# Patient Record
Sex: Male | Born: 1940 | Race: White | Hispanic: No | Marital: Married | Smoking: Former smoker
Health system: Southern US, Community
[De-identification: ages and names within clinical notes are randomized; demographics above are authoritative.]

## PROBLEM LIST (undated history)

## (undated) DIAGNOSIS — K219 Gastro-esophageal reflux disease without esophagitis: Secondary | ICD-10-CM

## (undated) DIAGNOSIS — I209 Angina pectoris, unspecified: Secondary | ICD-10-CM

## (undated) DIAGNOSIS — I10 Essential (primary) hypertension: Secondary | ICD-10-CM

## (undated) DIAGNOSIS — I714 Abdominal aortic aneurysm, without rupture, unspecified: Secondary | ICD-10-CM

## (undated) DIAGNOSIS — Z974 Presence of external hearing-aid: Secondary | ICD-10-CM

## (undated) DIAGNOSIS — Z972 Presence of dental prosthetic device (complete) (partial): Secondary | ICD-10-CM

## (undated) DIAGNOSIS — N289 Disorder of kidney and ureter, unspecified: Secondary | ICD-10-CM

## (undated) DIAGNOSIS — I2510 Atherosclerotic heart disease of native coronary artery without angina pectoris: Secondary | ICD-10-CM

## (undated) DIAGNOSIS — G4730 Sleep apnea, unspecified: Secondary | ICD-10-CM

## (undated) DIAGNOSIS — E7800 Pure hypercholesterolemia, unspecified: Secondary | ICD-10-CM

## (undated) DIAGNOSIS — C801 Malignant (primary) neoplasm, unspecified: Secondary | ICD-10-CM

## (undated) DIAGNOSIS — E119 Type 2 diabetes mellitus without complications: Secondary | ICD-10-CM

## (undated) DIAGNOSIS — I429 Cardiomyopathy, unspecified: Secondary | ICD-10-CM

## (undated) DIAGNOSIS — G5600 Carpal tunnel syndrome, unspecified upper limb: Secondary | ICD-10-CM

## (undated) DIAGNOSIS — I499 Cardiac arrhythmia, unspecified: Secondary | ICD-10-CM

## (undated) DIAGNOSIS — I8390 Asymptomatic varicose veins of unspecified lower extremity: Secondary | ICD-10-CM

## (undated) DIAGNOSIS — B351 Tinea unguium: Secondary | ICD-10-CM

## (undated) DIAGNOSIS — M1990 Unspecified osteoarthritis, unspecified site: Secondary | ICD-10-CM

## (undated) DIAGNOSIS — E1121 Type 2 diabetes mellitus with diabetic nephropathy: Secondary | ICD-10-CM

## (undated) HISTORY — PX: TONSILLECTOMY: SUR1361

## (undated) HISTORY — PX: CORONARY ANGIOPLASTY: SHX604

---

## 1997-12-27 ENCOUNTER — Inpatient Hospital Stay (HOSPITAL_COMMUNITY): Admission: EM | Admit: 1997-12-27 | Discharge: 1997-12-30 | Payer: Self-pay | Admitting: Emergency Medicine

## 1998-09-19 ENCOUNTER — Ambulatory Visit (HOSPITAL_COMMUNITY): Admission: RE | Admit: 1998-09-19 | Discharge: 1998-09-19 | Payer: Self-pay | Admitting: *Deleted

## 2004-01-30 ENCOUNTER — Other Ambulatory Visit: Payer: Self-pay

## 2004-03-05 ENCOUNTER — Encounter: Payer: Self-pay | Admitting: Internal Medicine

## 2004-04-02 ENCOUNTER — Encounter: Payer: Self-pay | Admitting: Internal Medicine

## 2004-04-17 ENCOUNTER — Ambulatory Visit: Payer: Self-pay | Admitting: Internal Medicine

## 2007-01-01 ENCOUNTER — Ambulatory Visit: Payer: Self-pay | Admitting: Internal Medicine

## 2007-07-05 ENCOUNTER — Ambulatory Visit: Payer: Self-pay | Admitting: Internal Medicine

## 2008-02-23 ENCOUNTER — Ambulatory Visit: Payer: Self-pay | Admitting: Internal Medicine

## 2009-08-28 ENCOUNTER — Ambulatory Visit: Payer: Self-pay | Admitting: Internal Medicine

## 2010-07-25 ENCOUNTER — Other Ambulatory Visit: Payer: Self-pay | Admitting: Internal Medicine

## 2010-07-26 ENCOUNTER — Ambulatory Visit: Payer: Self-pay | Admitting: Internal Medicine

## 2010-08-26 ENCOUNTER — Ambulatory Visit: Payer: Self-pay | Admitting: Internal Medicine

## 2010-09-19 ENCOUNTER — Ambulatory Visit: Payer: Self-pay | Admitting: Internal Medicine

## 2012-03-02 ENCOUNTER — Encounter: Payer: Self-pay | Admitting: Nurse Practitioner

## 2012-03-02 ENCOUNTER — Encounter: Payer: Self-pay | Admitting: Cardiothoracic Surgery

## 2012-06-08 ENCOUNTER — Ambulatory Visit: Payer: Self-pay | Admitting: Internal Medicine

## 2012-09-15 ENCOUNTER — Ambulatory Visit: Payer: Self-pay | Admitting: Internal Medicine

## 2013-06-16 ENCOUNTER — Ambulatory Visit: Payer: Self-pay | Admitting: Internal Medicine

## 2014-09-18 DIAGNOSIS — E119 Type 2 diabetes mellitus without complications: Secondary | ICD-10-CM | POA: Insufficient documentation

## 2015-06-08 ENCOUNTER — Encounter: Payer: Self-pay | Admitting: *Deleted

## 2015-06-11 ENCOUNTER — Encounter: Admission: RE | Payer: Self-pay | Source: Ambulatory Visit

## 2015-06-11 ENCOUNTER — Ambulatory Visit
Admission: RE | Admit: 2015-06-11 | Payer: Medicare Other | Source: Ambulatory Visit | Admitting: Unknown Physician Specialty

## 2015-06-11 HISTORY — DX: Cardiac arrhythmia, unspecified: I49.9

## 2015-06-11 HISTORY — DX: Malignant (primary) neoplasm, unspecified: C80.1

## 2015-06-11 HISTORY — DX: Abdominal aortic aneurysm, without rupture: I71.4

## 2015-06-11 HISTORY — DX: Atherosclerotic heart disease of native coronary artery without angina pectoris: I25.10

## 2015-06-11 HISTORY — DX: Sleep apnea, unspecified: G47.30

## 2015-06-11 HISTORY — DX: Angina pectoris, unspecified: I20.9

## 2015-06-11 HISTORY — DX: Type 2 diabetes mellitus with diabetic nephropathy: E11.21

## 2015-06-11 HISTORY — DX: Abdominal aortic aneurysm, without rupture, unspecified: I71.40

## 2015-06-11 HISTORY — DX: Essential (primary) hypertension: I10

## 2015-06-11 HISTORY — DX: Pure hypercholesterolemia, unspecified: E78.00

## 2015-06-11 HISTORY — DX: Disorder of kidney and ureter, unspecified: N28.9

## 2015-06-11 HISTORY — DX: Gastro-esophageal reflux disease without esophagitis: K21.9

## 2015-06-11 HISTORY — DX: Asymptomatic varicose veins of unspecified lower extremity: I83.90

## 2015-06-11 HISTORY — DX: Cardiomyopathy, unspecified: I42.9

## 2015-06-11 HISTORY — DX: Tinea unguium: B35.1

## 2015-06-11 HISTORY — DX: Type 2 diabetes mellitus without complications: E11.9

## 2015-06-11 SURGERY — COLONOSCOPY WITH PROPOFOL
Anesthesia: General

## 2015-09-27 ENCOUNTER — Encounter: Payer: Self-pay | Admitting: *Deleted

## 2015-09-28 ENCOUNTER — Encounter
Admission: RE | Disposition: A | Discharge status: Home / Self Care | Payer: Self-pay | Source: Ambulatory Visit | Attending: Unknown Physician Specialty

## 2015-09-28 ENCOUNTER — Ambulatory Visit: Payer: Medicare Other | Admitting: Anesthesiology

## 2015-09-28 ENCOUNTER — Ambulatory Visit
Admission: RE | Admit: 2015-09-28 | Discharge: 2015-09-28 | Disposition: A | Discharge status: Home / Self Care | Payer: Medicare Other | Source: Ambulatory Visit | Attending: Unknown Physician Specialty | Admitting: Unknown Physician Specialty

## 2015-09-28 ENCOUNTER — Encounter: Payer: Self-pay | Admitting: *Deleted

## 2015-09-28 DIAGNOSIS — Z85118 Personal history of other malignant neoplasm of bronchus and lung: Secondary | ICD-10-CM | POA: Insufficient documentation

## 2015-09-28 DIAGNOSIS — Z8249 Family history of ischemic heart disease and other diseases of the circulatory system: Secondary | ICD-10-CM | POA: Insufficient documentation

## 2015-09-28 DIAGNOSIS — K5730 Diverticulosis of large intestine without perforation or abscess without bleeding: Secondary | ICD-10-CM | POA: Diagnosis not present

## 2015-09-28 DIAGNOSIS — E7800 Pure hypercholesterolemia, unspecified: Secondary | ICD-10-CM | POA: Insufficient documentation

## 2015-09-28 DIAGNOSIS — I480 Paroxysmal atrial fibrillation: Secondary | ICD-10-CM | POA: Insufficient documentation

## 2015-09-28 DIAGNOSIS — G4730 Sleep apnea, unspecified: Secondary | ICD-10-CM | POA: Diagnosis not present

## 2015-09-28 DIAGNOSIS — Z833 Family history of diabetes mellitus: Secondary | ICD-10-CM | POA: Insufficient documentation

## 2015-09-28 DIAGNOSIS — K621 Rectal polyp: Secondary | ICD-10-CM | POA: Diagnosis not present

## 2015-09-28 DIAGNOSIS — Z7982 Long term (current) use of aspirin: Secondary | ICD-10-CM | POA: Diagnosis not present

## 2015-09-28 DIAGNOSIS — I714 Abdominal aortic aneurysm, without rupture: Secondary | ICD-10-CM | POA: Insufficient documentation

## 2015-09-28 DIAGNOSIS — Z794 Long term (current) use of insulin: Secondary | ICD-10-CM | POA: Insufficient documentation

## 2015-09-28 DIAGNOSIS — Z87891 Personal history of nicotine dependence: Secondary | ICD-10-CM | POA: Insufficient documentation

## 2015-09-28 DIAGNOSIS — I429 Cardiomyopathy, unspecified: Secondary | ICD-10-CM | POA: Insufficient documentation

## 2015-09-28 DIAGNOSIS — Z955 Presence of coronary angioplasty implant and graft: Secondary | ICD-10-CM | POA: Diagnosis not present

## 2015-09-28 DIAGNOSIS — I10 Essential (primary) hypertension: Secondary | ICD-10-CM | POA: Diagnosis not present

## 2015-09-28 DIAGNOSIS — D122 Benign neoplasm of ascending colon: Secondary | ICD-10-CM | POA: Diagnosis not present

## 2015-09-28 DIAGNOSIS — D125 Benign neoplasm of sigmoid colon: Secondary | ICD-10-CM | POA: Insufficient documentation

## 2015-09-28 DIAGNOSIS — E1121 Type 2 diabetes mellitus with diabetic nephropathy: Secondary | ICD-10-CM | POA: Insufficient documentation

## 2015-09-28 DIAGNOSIS — Z882 Allergy status to sulfonamides status: Secondary | ICD-10-CM | POA: Diagnosis not present

## 2015-09-28 DIAGNOSIS — Z6838 Body mass index (BMI) 38.0-38.9, adult: Secondary | ICD-10-CM | POA: Diagnosis not present

## 2015-09-28 DIAGNOSIS — K219 Gastro-esophageal reflux disease without esophagitis: Secondary | ICD-10-CM | POA: Insufficient documentation

## 2015-09-28 DIAGNOSIS — Z1211 Encounter for screening for malignant neoplasm of colon: Secondary | ICD-10-CM | POA: Diagnosis not present

## 2015-09-28 DIAGNOSIS — Z801 Family history of malignant neoplasm of trachea, bronchus and lung: Secondary | ICD-10-CM | POA: Diagnosis not present

## 2015-09-28 DIAGNOSIS — Z79899 Other long term (current) drug therapy: Secondary | ICD-10-CM | POA: Insufficient documentation

## 2015-09-28 DIAGNOSIS — I2510 Atherosclerotic heart disease of native coronary artery without angina pectoris: Secondary | ICD-10-CM | POA: Insufficient documentation

## 2015-09-28 DIAGNOSIS — K640 First degree hemorrhoids: Secondary | ICD-10-CM | POA: Diagnosis not present

## 2015-09-28 DIAGNOSIS — Z7901 Long term (current) use of anticoagulants: Secondary | ICD-10-CM | POA: Diagnosis not present

## 2015-09-28 HISTORY — PX: COLONOSCOPY WITH PROPOFOL: SHX5780

## 2015-09-28 LAB — GLUCOSE, CAPILLARY: GLUCOSE-CAPILLARY: 140 mg/dL — AB (ref 65–99)

## 2015-09-28 SURGERY — COLONOSCOPY WITH PROPOFOL
Anesthesia: General

## 2015-09-28 MED ORDER — FENTANYL CITRATE (PF) 100 MCG/2ML IJ SOLN
INTRAMUSCULAR | Status: DC | PRN
  Administered 2015-09-28: 50 ug via INTRAVENOUS

## 2015-09-28 MED ORDER — SODIUM CHLORIDE 0.9 % IV SOLN
INTRAVENOUS | Status: DC
  Administered 2015-09-28: 09:00:00 via INTRAVENOUS

## 2015-09-28 MED ORDER — SODIUM CHLORIDE 0.9 % IV SOLN: INTRAVENOUS | Status: DC

## 2015-09-28 MED ORDER — LIDOCAINE HCL (PF) 2 % IJ SOLN
INTRAMUSCULAR | Status: DC | PRN
  Administered 2015-09-28: 50 mg

## 2015-09-28 MED ORDER — MIDAZOLAM HCL 5 MG/5ML IJ SOLN
INTRAMUSCULAR | Status: DC | PRN
  Administered 2015-09-28: 1 mg via INTRAVENOUS

## 2015-09-28 MED ORDER — PROPOFOL 500 MG/50ML IV EMUL
INTRAVENOUS | Status: DC | PRN
  Administered 2015-09-28: 30 ug/kg/min via INTRAVENOUS

## 2015-09-28 MED ORDER — PROPOFOL 10 MG/ML IV BOLUS
INTRAVENOUS | Status: DC | PRN
  Administered 2015-09-28: 30 mg via INTRAVENOUS
  Administered 2015-09-28: 10 mg via INTRAVENOUS

## 2015-09-28 NOTE — Anesthesia Postprocedure Evaluation (Signed)
Anesthesia Post Note  Patient: Glen Jenkins  Procedure(s) Performed: Procedure(s) (LRB): COLONOSCOPY WITH PROPOFOL (N/A)  Patient location during evaluation: Endoscopy Anesthesia Type: General Level of consciousness: awake and alert Pain management: pain level controlled Vital Signs Assessment: post-procedure vital signs reviewed and stable Respiratory status: spontaneous breathing, nonlabored ventilation, respiratory function stable and patient connected to nasal cannula oxygen Cardiovascular status: blood pressure returned to baseline and stable Postop Assessment: no signs of nausea or vomiting Anesthetic complications: no    Last Vitals:  Filed Vitals:   09/28/15 0941 09/28/15 0950  BP: 126/66 127/55  Pulse: 47   Temp:    Resp: 12     Last Pain: There were no vitals filed for this visit.               Martha Clan

## 2015-09-28 NOTE — Op Note (Signed)
[facility] Gastroenterology Patient Name: Glen Jenkins Procedure Date: 09/28/2015 9:00 AM MRN: [id number] Account #: [id number] Date of Birth: Jun 02, 1941 Admit Type: Outpatient Age: 75 Room: [facility] ENDO ROOM 1 Gender: Male Note Status: Finalized Procedure:            Colonoscopy Indications:          Screening for colorectal malignant neoplasm Providers:            Manya Silvas, MD Referring MD:         Leonie Douglas. Doy Hutching, MD (Referring MD) Medicines:            Propofol per Anesthesia Complications:        No immediate complications. Procedure:            Pre-Anesthesia Assessment:                       - After reviewing the risks and benefits, the patient                        was deemed in satisfactory condition to undergo the                        procedure.                       After obtaining informed consent, the colonoscope was                        passed under direct vision. Throughout the procedure,                        the patient's blood pressure, pulse, and oxygen                        saturations were monitored continuously. The                        Colonoscope was introduced through the anus and                        advanced to the the cecum, identified by appendiceal                        orifice and ileocecal valve. The colonoscopy was                        performed without difficulty. The patient tolerated the                        procedure well. The quality of the bowel preparation                        was good. Findings:      Three sessile polyps were found in the rectum, proximal sigmoid colon       and ascending colon. The polyps were diminutive in size. These polyps       were removed with a jumbo cold forceps. Resection and retrieval were       complete.      Multiple small-mouthed diverticula were found in the sigmoid colon.      Internal hemorrhoids were found during  endoscopy. The hemorrhoids were       small  and Grade I (internal hemorrhoids that do not prolapse).      The exam was otherwise without abnormality. Impression:           - Three diminutive polyps in the rectum, in the                        proximal sigmoid colon and in the ascending colon,                        removed with a jumbo cold forceps. Resected and                        retrieved.                       - Diverticulosis in the sigmoid colon.                       - Internal hemorrhoids.                       - The examination was otherwise normal. Recommendation:       - Await pathology results. Wait 3 days to restart blood                        thinner. Manya Silvas, MD 09/28/2015 9:38:53 AM This report has been signed electronically. Number of Addenda: 0 Note Initiated On: 09/28/2015 9:00 AM Scope Withdrawal Time: 0 hours 14 minutes 57 seconds  Total Procedure Duration: 0 hours 22 minutes 52 seconds       [facility]

## 2015-09-28 NOTE — H&P (Signed)
Primary Care Physician:  Idelle Crouch, MD Primary Gastroenterologist:  Dr. Vira Agar  Pre-Procedure History & Physical: HPI:  Glen Jenkins is a 75 y.o. male is here for an colonoscopy.   Past Medical History  Diagnosis Date  . AAA (abdominal aortic aneurysm) ([place])   . Anginal pain ([place])   . Dysrhythmia     Atrial Fibrillation  . Coronary artery disease   . Cardiomyopathy ([place])   . Diabetes mellitus without complication ([place])   . GERD (gastroesophageal reflux disease)   . Hypertension   . Hypercholesterolemia   . Nephropathy, diabetic ([place])   . Onychomycosis   . Renal insufficiency   . Sleep apnea   . Cancer ([place])     Lung Cancer; Squamous Cell Carcinoma  . Varicose veins     Past Surgical History  Procedure Laterality Date  . Tonsillectomy    . Coronary angioplasty      Stent placement x 2    Prior to Admission medications   Medication Sig Start Date End Date Taking? Authorizing Provider  aspirin EC 81 MG tablet Take 81 mg by mouth daily.   Yes Historical Provider, MD  b complex vitamins capsule Take 1 capsule by mouth daily.   Yes Historical Provider, MD  dabigatran (PRADAXA) 150 MG CAPS capsule Take 150 mg by mouth 2 (two) times daily.   Yes Historical Provider, MD  Multiple Vitamin (ANTIOXIDANT A/C/E PO) Take 1 tablet by mouth daily.   Yes Historical Provider, MD  amiodarone (PACERONE) 200 MG tablet Take 200 mg by mouth daily.    Historical Provider, MD  Biotin 1 MG CAPS Take 1 tablet by mouth daily.    Historical Provider, MD  docusate calcium (SURFAK) 240 MG capsule Take 240 mg by mouth daily.    Historical Provider, MD  furosemide (LASIX) 20 MG tablet Take 20 mg by mouth.    Historical Provider, MD  glipiZIDE (GLUCOTROL XL) 10 MG 24 hr tablet Take 10 mg by mouth daily with breakfast.    Historical Provider, MD  isosorbide mononitrate (IMDUR) 30 MG 24 hr tablet Take 30 mg by mouth daily.    Historical Provider, MD  lisinopril (PRINIVIL,ZESTRIL) 40 MG tablet  Take 40 mg by mouth daily.    Historical Provider, MD  metFORMIN (GLUCOPHAGE) 1000 MG tablet Take 1,000 mg by mouth 2 (two) times daily with a meal.    Historical Provider, MD  pantoprazole (PROTONIX) 40 MG tablet Take 40 mg by mouth daily.    Historical Provider, MD  simvastatin (ZOCOR) 40 MG tablet Take 40 mg by mouth daily at 6 PM.    Historical Provider, MD    Allergies as of 09/19/2015 - Review Complete 06/08/2015  Allergen Reaction Noted  . Sulfa antibiotics  06/08/2015    History reviewed. No pertinent family history.  Social History   Social History  . Marital Status: Married    Spouse Name: N/A  . Number of Children: N/A  . Years of Education: N/A   Occupational History  . Not on file.   Social History Main Topics  . Smoking status: Former Research scientist (life sciences)  . Smokeless tobacco: Never Used  . Alcohol Use: Yes  . Drug Use: No  . Sexual Activity: Not on file   Other Topics Concern  . Not on file   Social History Narrative    Review of Systems: See HPI, otherwise negative ROS  Physical Exam: BP 142/57 mmHg  Pulse 98  Temp(Src) 98.5 F (36.9 C) (Tympanic)  Resp 20  Ht 6\' 2"  (1.88 m)  Wt 136.079 kg (300 lb)  BMI 38.50 kg/m2  SpO2 99% General:   Alert,  pleasant and cooperative in NAD Head:  Normocephalic and atraumatic. Neck:  Supple; no masses or thyromegaly. Lungs:  Clear throughout to auscultation.    Heart:  Regular rate and rhythm. Abdomen:  Soft, nontender and nondistended. Normal bowel sounds, without guarding, and without rebound.   Neurologic:  Alert and  oriented x4;  grossly normal neurologically.  Impression/Plan: Glen Jenkins is here for an colonoscopy to be performed for screening  Risks, benefits, limitations, and alternatives regarding  colonoscopy have been reviewed with the patient.  Questions have been answered.  All parties agreeable.   Gaylyn Cheers, MD  09/28/2015, 8:55 AM

## 2015-09-28 NOTE — Transfer of Care (Signed)
Immediate Anesthesia Transfer of Care Note  Patient: Glen Jenkins  Procedure(s) Performed: Procedure(s): COLONOSCOPY WITH PROPOFOL (N/A)  Patient Location: PACU  Anesthesia Type:General  Level of Consciousness: sedated  Airway & Oxygen Therapy: Patient Spontanous Breathing and Patient connected to nasal cannula oxygen  Post-op Assessment: Report given to RN and Post -op Vital signs reviewed and stable  Post vital signs: Reviewed and stable  Last Vitals:  Filed Vitals:   09/28/15 0827  BP: 142/57  Pulse: 98  Temp: 36.9 C  Resp: 20    Last Pain: There were no vitals filed for this visit.       Complications: No apparent anesthesia complications

## 2015-09-28 NOTE — Anesthesia Preprocedure Evaluation (Signed)
Anesthesia Evaluation  Patient identified by MRN, date of birth, ID band Patient awake    Reviewed: Allergy & Precautions, H&P , NPO status , Patient's Chart, lab work & pertinent test results, reviewed documented beta blocker date and time   History of Anesthesia Complications Negative for: history of anesthetic complications  Airway Mallampati: III  TM Distance: >3 FB Neck ROM: full    Dental no notable dental hx. (+) Partial Upper, Missing, Teeth Intact   Pulmonary neg shortness of breath, sleep apnea , neg COPD, Recent URI , Resolved, former smoker,    Pulmonary exam normal breath sounds clear to auscultation       Cardiovascular Exercise Tolerance: Good hypertension, (-) angina+ CAD, + Past MI, + Cardiac Stents (3 stents, last placed 3-4 years ago), + Peripheral Vascular Disease and +CHF  (-) CABG Normal cardiovascular exam+ dysrhythmias Atrial Fibrillation + Valvular Problems/Murmurs  Rhythm:regular Rate:Normal     Neuro/Psych negative neurological ROS  negative psych ROS   GI/Hepatic Neg liver ROS, GERD  ,  Endo/Other  diabetes, Oral Hypoglycemic AgentsMorbid obesity  Renal/GU CRFRenal disease  negative genitourinary   Musculoskeletal   Abdominal   Peds  Hematology negative hematology ROS (+)   Anesthesia Other Findings Past Medical History:   AAA (abdominal aortic aneurysm) ([facility])                        Anginal pain ([facility])                                           Dysrhythmia                                                    Comment:Atrial Fibrillation   Coronary artery disease                                      Cardiomyopathy ([place])                                         Diabetes mellitus without complication ([place])                 GERD (gastroesophageal reflux disease)                       Hypertension                                                 Hypercholesterolemia                                          Nephropathy, 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[place])  Onychomycosis                                                Renal insufficiency                                          Sleep apnea                                                  Cancer ([place])                                                   Comment:Lung Cancer; Squamous Cell Carcinoma   Varicose veins                                               Reproductive/Obstetrics negative OB ROS                             Anesthesia Physical Anesthesia Plan  ASA: III  Anesthesia Plan: General   Post-op Pain Management:    Induction:   Airway Management Planned:   Additional Equipment:   Intra-op Plan:   Post-operative Plan:   Informed Consent: I have reviewed the patients History and Physical, chart, labs and discussed the procedure including the risks, benefits and alternatives for the proposed anesthesia with the patient or authorized representative who has indicated his/her understanding and acceptance.   Dental Advisory Given  Plan Discussed with: Anesthesiologist, CRNA and Surgeon  Anesthesia Plan Comments:         Anesthesia Quick Evaluation

## 2015-10-01 ENCOUNTER — Encounter: Payer: Self-pay | Admitting: Unknown Physician Specialty

## 2015-10-01 LAB — SURGICAL PATHOLOGY

## 2015-10-03 ENCOUNTER — Other Ambulatory Visit: Payer: Self-pay | Admitting: Internal Medicine

## 2015-10-03 DIAGNOSIS — I714 Abdominal aortic aneurysm, without rupture, unspecified: Secondary | ICD-10-CM

## 2015-10-15 ENCOUNTER — Other Ambulatory Visit: Payer: Self-pay | Admitting: Internal Medicine

## 2015-10-15 DIAGNOSIS — I714 Abdominal aortic aneurysm, without rupture, unspecified: Secondary | ICD-10-CM

## 2015-11-01 ENCOUNTER — Ambulatory Visit
Admission: RE | Admit: 2015-11-01 | Discharge: 2015-11-01 | Disposition: A | Discharge status: Home / Self Care | Payer: Medicare Other | Source: Ambulatory Visit | Attending: Internal Medicine | Admitting: Internal Medicine

## 2015-11-01 DIAGNOSIS — I77819 Aortic ectasia, unspecified site: Secondary | ICD-10-CM | POA: Diagnosis not present

## 2015-11-01 DIAGNOSIS — Q6102 Congenital multiple renal cysts: Secondary | ICD-10-CM | POA: Diagnosis not present

## 2015-11-01 DIAGNOSIS — I714 Abdominal aortic aneurysm, without rupture, unspecified: Secondary | ICD-10-CM

## 2016-01-04 DIAGNOSIS — N183 Chronic kidney disease, stage 3 unspecified: Secondary | ICD-10-CM | POA: Insufficient documentation

## 2017-02-09 DIAGNOSIS — K649 Unspecified hemorrhoids: Secondary | ICD-10-CM | POA: Insufficient documentation

## 2017-05-18 ENCOUNTER — Other Ambulatory Visit: Payer: Self-pay | Admitting: Physician Assistant

## 2017-05-18 ENCOUNTER — Ambulatory Visit
Admission: RE | Admit: 2017-05-18 | Discharge: 2017-05-18 | Disposition: A | Discharge status: Home / Self Care | Payer: Medicare Other | Source: Ambulatory Visit | Attending: Physician Assistant | Admitting: Physician Assistant

## 2017-05-18 DIAGNOSIS — M4802 Spinal stenosis, cervical region: Secondary | ICD-10-CM | POA: Diagnosis not present

## 2017-05-18 DIAGNOSIS — S0990XA Unspecified injury of head, initial encounter: Secondary | ICD-10-CM | POA: Diagnosis present

## 2017-05-18 DIAGNOSIS — X58XXXA Exposure to other specified factors, initial encounter: Secondary | ICD-10-CM | POA: Diagnosis not present

## 2017-05-18 DIAGNOSIS — S199XXA Unspecified injury of neck, initial encounter: Secondary | ICD-10-CM

## 2017-10-14 ENCOUNTER — Other Ambulatory Visit: Payer: Self-pay | Admitting: Internal Medicine

## 2017-10-14 DIAGNOSIS — I714 Abdominal aortic aneurysm, without rupture, unspecified: Secondary | ICD-10-CM

## 2017-10-23 ENCOUNTER — Ambulatory Visit
Admission: RE | Admit: 2017-10-23 | Discharge: 2017-10-23 | Disposition: A | Discharge status: Home / Self Care | Payer: Medicare Other | Source: Ambulatory Visit | Attending: Internal Medicine | Admitting: Internal Medicine

## 2017-10-23 DIAGNOSIS — I714 Abdominal aortic aneurysm, without rupture, unspecified: Secondary | ICD-10-CM

## 2018-10-20 DIAGNOSIS — H3530 Unspecified macular degeneration: Secondary | ICD-10-CM | POA: Insufficient documentation

## 2019-05-02 ENCOUNTER — Other Ambulatory Visit: Payer: Self-pay

## 2019-05-02 DIAGNOSIS — Z20822 Contact with and (suspected) exposure to covid-19: Secondary | ICD-10-CM

## 2019-05-03 LAB — NOVEL CORONAVIRUS, NAA: SARS-CoV-2, NAA: NOT DETECTED

## 2019-08-08 DIAGNOSIS — R600 Localized edema: Secondary | ICD-10-CM | POA: Insufficient documentation

## 2019-08-08 DIAGNOSIS — I10 Essential (primary) hypertension: Secondary | ICD-10-CM | POA: Insufficient documentation

## 2020-02-13 DIAGNOSIS — I2510 Atherosclerotic heart disease of native coronary artery without angina pectoris: Secondary | ICD-10-CM | POA: Insufficient documentation

## 2020-02-13 DIAGNOSIS — I4891 Unspecified atrial fibrillation: Secondary | ICD-10-CM | POA: Insufficient documentation

## 2020-02-13 DIAGNOSIS — I714 Abdominal aortic aneurysm, without rupture, unspecified: Secondary | ICD-10-CM | POA: Insufficient documentation

## 2020-02-13 DIAGNOSIS — I209 Angina pectoris, unspecified: Secondary | ICD-10-CM | POA: Insufficient documentation

## 2020-02-13 DIAGNOSIS — R809 Proteinuria, unspecified: Secondary | ICD-10-CM | POA: Insufficient documentation

## 2020-02-13 DIAGNOSIS — I429 Cardiomyopathy, unspecified: Secondary | ICD-10-CM | POA: Insufficient documentation

## 2020-02-13 DIAGNOSIS — R600 Localized edema: Secondary | ICD-10-CM | POA: Insufficient documentation

## 2020-02-13 DIAGNOSIS — E7800 Pure hypercholesterolemia, unspecified: Secondary | ICD-10-CM | POA: Insufficient documentation

## 2020-06-21 ENCOUNTER — Encounter: Payer: Self-pay | Admitting: Ophthalmology

## 2020-06-21 ENCOUNTER — Other Ambulatory Visit: Payer: Self-pay

## 2020-06-25 ENCOUNTER — Other Ambulatory Visit
Admission: RE | Admit: 2020-06-25 | Discharge: 2020-06-25 | Disposition: A | Discharge status: Home / Self Care | Payer: Medicare Other | Source: Ambulatory Visit | Attending: Ophthalmology | Admitting: Ophthalmology

## 2020-06-25 ENCOUNTER — Other Ambulatory Visit: Payer: Self-pay

## 2020-06-25 DIAGNOSIS — Z01812 Encounter for preprocedural laboratory examination: Secondary | ICD-10-CM | POA: Diagnosis present

## 2020-06-25 DIAGNOSIS — Z20822 Contact with and (suspected) exposure to covid-19: Secondary | ICD-10-CM | POA: Insufficient documentation

## 2020-06-25 LAB — SARS CORONAVIRUS 2 (TAT 6-24 HRS): SARS Coronavirus 2: NEGATIVE

## 2020-06-25 NOTE — Discharge Instructions (Signed)

## 2020-06-27 ENCOUNTER — Other Ambulatory Visit: Payer: Self-pay

## 2020-06-27 ENCOUNTER — Encounter: Payer: Self-pay | Admitting: Ophthalmology

## 2020-06-27 ENCOUNTER — Ambulatory Visit: Payer: Medicare Other | Admitting: Anesthesiology

## 2020-06-27 ENCOUNTER — Encounter
Admission: RE | Disposition: A | Discharge status: Home / Self Care | Payer: Self-pay | Source: Home / Self Care | Attending: Ophthalmology

## 2020-06-27 ENCOUNTER — Ambulatory Visit
Admission: RE | Admit: 2020-06-27 | Discharge: 2020-06-27 | Disposition: A | Discharge status: Home / Self Care | Payer: Medicare Other | Attending: Ophthalmology | Admitting: Ophthalmology

## 2020-06-27 DIAGNOSIS — Z79899 Other long term (current) drug therapy: Secondary | ICD-10-CM | POA: Insufficient documentation

## 2020-06-27 DIAGNOSIS — Z7984 Long term (current) use of oral hypoglycemic drugs: Secondary | ICD-10-CM | POA: Diagnosis not present

## 2020-06-27 DIAGNOSIS — Z882 Allergy status to sulfonamides status: Secondary | ICD-10-CM | POA: Insufficient documentation

## 2020-06-27 DIAGNOSIS — Z955 Presence of coronary angioplasty implant and graft: Secondary | ICD-10-CM | POA: Insufficient documentation

## 2020-06-27 DIAGNOSIS — H2512 Age-related nuclear cataract, left eye: Secondary | ICD-10-CM | POA: Diagnosis present

## 2020-06-27 DIAGNOSIS — Z87891 Personal history of nicotine dependence: Secondary | ICD-10-CM | POA: Insufficient documentation

## 2020-06-27 HISTORY — DX: Unspecified osteoarthritis, unspecified site: M19.90

## 2020-06-27 HISTORY — DX: Carpal tunnel syndrome, unspecified upper limb: G56.00

## 2020-06-27 HISTORY — DX: Presence of external hearing-aid: Z97.4

## 2020-06-27 HISTORY — PX: CATARACT EXTRACTION W/PHACO: SHX586

## 2020-06-27 HISTORY — DX: Presence of dental prosthetic device (complete) (partial): Z97.2

## 2020-06-27 LAB — GLUCOSE, CAPILLARY
Glucose-Capillary: 97 mg/dL (ref 70–99)
Glucose-Capillary: 90 mg/dL (ref 70–99)

## 2020-06-27 SURGERY — PHACOEMULSIFICATION, CATARACT, WITH IOL INSERTION
Anesthesia: Monitor Anesthesia Care | Site: Eye | Laterality: Left

## 2020-06-27 MED ORDER — FENTANYL CITRATE (PF) 100 MCG/2ML IJ SOLN
INTRAMUSCULAR | Status: DC | PRN
  Administered 2020-06-27: 50 ug via INTRAVENOUS

## 2020-06-27 MED ORDER — ACETAMINOPHEN 160 MG/5ML PO SOLN: 325.0000 mg | ORAL | Status: DC | PRN

## 2020-06-27 MED ORDER — CEFUROXIME OPHTHALMIC INJECTION 1 MG/0.1 ML
INJECTION | OPHTHALMIC | Status: DC | PRN
  Administered 2020-06-27: 0.1 mL via INTRACAMERAL

## 2020-06-27 MED ORDER — NA HYALUR & NA CHOND-NA HYALUR 0.4-0.35 ML IO KIT
PACK | INTRAOCULAR | Status: DC | PRN
  Administered 2020-06-27: 1 mL via INTRAOCULAR

## 2020-06-27 MED ORDER — MIDAZOLAM HCL 2 MG/2ML IJ SOLN
INTRAMUSCULAR | Status: DC | PRN
  Administered 2020-06-27: 1 mg via INTRAVENOUS

## 2020-06-27 MED ORDER — EPINEPHRINE PF 1 MG/ML IJ SOLN
INTRAOCULAR | Status: DC | PRN
  Administered 2020-06-27: 98 mL via OPHTHALMIC

## 2020-06-27 MED ORDER — ACETAMINOPHEN 325 MG PO TABS: 325.0000 mg | ORAL_TABLET | ORAL | Status: DC | PRN

## 2020-06-27 MED ORDER — LIDOCAINE HCL (PF) 2 % IJ SOLN
INTRAOCULAR | Status: DC | PRN
  Administered 2020-06-27: 2 mL

## 2020-06-27 MED ORDER — TETRACAINE HCL 0.5 % OP SOLN
1.0000 [drp] | OPHTHALMIC | Status: DC | PRN
  Administered 2020-06-27 (×3): 1 [drp] via OPHTHALMIC

## 2020-06-27 MED ORDER — BRIMONIDINE TARTRATE-TIMOLOL 0.2-0.5 % OP SOLN
OPHTHALMIC | Status: DC | PRN
  Administered 2020-06-27: 1 [drp] via OPHTHALMIC

## 2020-06-27 MED ORDER — ARMC OPHTHALMIC DILATING DROPS
1.0000 "application " | OPHTHALMIC | Status: DC | PRN
  Administered 2020-06-27 (×3): 1 via OPHTHALMIC

## 2020-06-27 MED ORDER — ONDANSETRON HCL 4 MG/2ML IJ SOLN: 4.0000 mg | Freq: Once | INTRAMUSCULAR | Status: DC | PRN

## 2020-06-27 SURGICAL SUPPLY — 23 items
CANNULA ANT/CHMB 27G (MISCELLANEOUS) ×1 IMPLANT
CANNULA ANT/CHMB 27GA (MISCELLANEOUS) ×2 IMPLANT
GLOVE SURG LX 7.5 STRW (GLOVE) ×1
GLOVE SURG LX STRL 7.5 STRW (GLOVE) ×1 IMPLANT
GLOVE SURG TRIUMPH 8.0 PF LTX (GLOVE) ×2 IMPLANT
GOWN STRL REUS W/ TWL LRG LVL3 (GOWN DISPOSABLE) ×2 IMPLANT
GOWN STRL REUS W/TWL LRG LVL3 (GOWN DISPOSABLE) ×4
LENS IOL DIOP 23.0 (Intraocular Lens) ×2 IMPLANT
LENS IOL TECNIS MONO 23.0 (Intraocular Lens) IMPLANT
MARKER SKIN DUAL TIP RULER LAB (MISCELLANEOUS) ×2 IMPLANT
NDL CAPSULORHEX 25GA (NEEDLE) ×1 IMPLANT
NDL FILTER BLUNT 18X1 1/2 (NEEDLE) ×2 IMPLANT
NEEDLE CAPSULORHEX 25GA (NEEDLE) ×2 IMPLANT
NEEDLE FILTER BLUNT 18X 1/2SAF (NEEDLE) ×2
NEEDLE FILTER BLUNT 18X1 1/2 (NEEDLE) ×2 IMPLANT
PACK CATARACT BRASINGTON (MISCELLANEOUS) ×2 IMPLANT
PACK EYE AFTER SURG (MISCELLANEOUS) ×2 IMPLANT
PACK OPTHALMIC (MISCELLANEOUS) ×2 IMPLANT
SOLUTION OPHTHALMIC SALT (MISCELLANEOUS) ×2 IMPLANT
SYR 3ML LL SCALE MARK (SYRINGE) ×4 IMPLANT
SYR TB 1ML LUER SLIP (SYRINGE) ×2 IMPLANT
WATER STERILE IRR 250ML POUR (IV SOLUTION) ×2 IMPLANT
WIPE NON LINTING 3.25X3.25 (MISCELLANEOUS) ×2 IMPLANT

## 2020-06-27 NOTE — H&P (Signed)

## 2020-06-27 NOTE — Op Note (Signed)
OPERATIVE NOTE  KAELIN BONELLI [id number] 06/27/2020   PREOPERATIVE DIAGNOSIS:  Nuclear sclerotic cataract left eye. H25.12   POSTOPERATIVE DIAGNOSIS:    Nuclear sclerotic cataract left eye.     PROCEDURE:  Phacoemusification with posterior chamber intraocular lens placement of the left eye  Ultrasound time: Procedure(s) with comments: CATARACT EXTRACTION PHACO AND INTRAOCULAR LENS PLACEMENT (IOC) LEFT DIABETIC 13.57 01:47.8 12.6% (Left) - Diabetic - oral meds  LENS:   Implant Name Type Inv. Item Serial No. Manufacturer Lot No. LRB No. Used Action  LENS IOL DIOP 23.0 - [id number] Intraocular Lens LENS IOL DIOP 23.0 [id number] [id number]   Left 1 Implanted      SURGEON:  Wyonia Hough, MD   ANESTHESIA:  Topical with tetracaine drops and 2% Xylocaine jelly, augmented with 1% preservative-free intracameral lidocaine.    COMPLICATIONS:  None.   DESCRIPTION OF PROCEDURE:  The patient was identified in the holding room and transported to the operating room and placed in the supine position under the operating microscope.  The left eye was identified as the operative eye and it was prepped and draped in the usual sterile ophthalmic fashion.   A 1 millimeter clear-corneal paracentesis was made at the 1:30 position.  0.5 ml of preservative-free 1% lidocaine was injected into the anterior chamber.  The anterior chamber was filled with Viscoat viscoelastic.  A 2.4 millimeter keratome was used to make a near-clear corneal incision at the 10:30 position.  .  A curvilinear capsulorrhexis was made with a cystotome and capsulorrhexis forceps.  Balanced salt solution was used to hydrodissect and hydrodelineate the nucleus.   Phacoemulsification was then used in stop and chop fashion to remove the lens nucleus and epinucleus.  The remaining cortex was then removed using the irrigation and aspiration handpiece. Provisc was then placed into the capsular bag to distend it for lens placement.  A lens  was then injected into the capsular bag.  The remaining viscoelastic was aspirated.   Wounds were hydrated with balanced salt solution.  The anterior chamber was inflated to a physiologic pressure with balanced salt solution.  No wound leaks were noted. Cefuroxime 0.1 ml of a 10mg /ml solution was injected into the anterior chamber for a dose of 1 mg of intracameral antibiotic at the completion of the case.   Timolol and Brimonidine drops were applied to the eye.  The patient was taken to the recovery room in stable condition without complications of anesthesia or surgery.  Eluterio Seymour 06/27/2020, 10:11 AM

## 2020-06-27 NOTE — Anesthesia Preprocedure Evaluation (Signed)
Anesthesia Evaluation  Patient identified by MRN, date of birth, ID band Patient awake    History of Anesthesia Complications Negative for: history of anesthetic complications  Airway Mallampati: II  TM Distance: >3 FB Neck ROM: Full    Dental no notable dental hx.    Pulmonary sleep apnea , former smoker,    Pulmonary exam normal        Cardiovascular hypertension, + angina (stable angina, on Imdur) + Peripheral Vascular Disease (AAA)  Normal cardiovascular exam+ dysrhythmias (pAF, on amiodarone, no AC)      Neuro/Psych    GI/Hepatic negative GI ROS, Neg liver ROS,   Endo/Other  diabetes, Well Controlled, Type 2, Oral Hypoglycemic Agents  Renal/GU CRFRenal disease (stage III CKD)     Musculoskeletal   Abdominal   Peds  Hematology   Anesthesia Other Findings   Reproductive/Obstetrics                            Anesthesia Physical Anesthesia Plan  ASA: III  Anesthesia Plan: MAC   Post-op Pain Management:    Induction: Intravenous  PONV Risk Score and Plan: 1 and TIVA and Midazolam  Airway Management Planned: Nasal Cannula and Natural Airway  Additional Equipment: None  Intra-op Plan:   Post-operative Plan:   Informed Consent: I have reviewed the patients History and Physical, chart, labs and discussed the procedure including the risks, benefits and alternatives for the proposed anesthesia with the patient or authorized representative who has indicated his/her understanding and acceptance.       Plan Discussed with: CRNA  Anesthesia Plan Comments:         Anesthesia Quick Evaluation

## 2020-06-27 NOTE — Transfer of Care (Signed)
Immediate Anesthesia Transfer of Care Note  Patient: Glen Jenkins  Procedure(s) Performed: CATARACT EXTRACTION PHACO AND INTRAOCULAR LENS PLACEMENT (IOC) LEFT DIABETIC 13.57 01:47.8 12.6% (Left Eye)  Patient Location: PACU  Anesthesia Type: MAC  Level of Consciousness: awake, alert  and patient cooperative  Airway and Oxygen Therapy: Patient Spontanous Breathing and Patient connected to supplemental oxygen  Post-op Assessment: Post-op Vital signs reviewed, Patient's Cardiovascular Status Stable, Respiratory Function Stable, Patent Airway and No signs of Nausea or vomiting  Post-op Vital Signs: Reviewed and stable  Complications: No complications documented.

## 2020-06-27 NOTE — Anesthesia Procedure Notes (Signed)
Date/Time: 06/27/2020 9:50 AM Performed by: Dionne Bucy, CRNA Pre-anesthesia Checklist: Patient identified, Emergency Drugs available, Suction available, Patient being monitored and Timeout performed Oxygen Delivery Method: Nasal cannula Placement Confirmation: positive ETCO2

## 2020-06-27 NOTE — Anesthesia Postprocedure Evaluation (Signed)
Anesthesia Post Note  Patient: Glen Jenkins  Procedure(s) Performed: CATARACT EXTRACTION PHACO AND INTRAOCULAR LENS PLACEMENT (IOC) LEFT DIABETIC 13.57 01:47.8 12.6% (Left Eye)     Patient location during evaluation: PACU Anesthesia Type: MAC Level of consciousness: awake and alert Pain management: pain level controlled Vital Signs Assessment: post-procedure vital signs reviewed and stable Respiratory status: spontaneous breathing Cardiovascular status: blood pressure returned to baseline Postop Assessment: no apparent nausea or vomiting, adequate PO intake and no headache Anesthetic complications: no   No complications documented.  Adele Barthel Niketa Turner

## 2020-06-28 ENCOUNTER — Encounter: Payer: Self-pay | Admitting: Ophthalmology

## 2020-07-02 ENCOUNTER — Encounter: Payer: Self-pay | Admitting: Ophthalmology

## 2020-07-02 ENCOUNTER — Other Ambulatory Visit: Payer: Self-pay

## 2020-07-09 ENCOUNTER — Other Ambulatory Visit
Admission: RE | Admit: 2020-07-09 | Discharge: 2020-07-09 | Disposition: A | Discharge status: Home / Self Care | Payer: Medicare Other | Source: Ambulatory Visit | Attending: Ophthalmology | Admitting: Ophthalmology

## 2020-07-09 ENCOUNTER — Other Ambulatory Visit: Payer: Self-pay

## 2020-07-09 DIAGNOSIS — Z20822 Contact with and (suspected) exposure to covid-19: Secondary | ICD-10-CM | POA: Insufficient documentation

## 2020-07-09 DIAGNOSIS — Z01812 Encounter for preprocedural laboratory examination: Secondary | ICD-10-CM | POA: Diagnosis present

## 2020-07-09 NOTE — Discharge Instructions (Signed)

## 2020-07-10 LAB — SARS CORONAVIRUS 2 (TAT 6-24 HRS): SARS Coronavirus 2: NEGATIVE

## 2020-07-11 ENCOUNTER — Encounter
Admission: RE | Disposition: A | Discharge status: Home / Self Care | Payer: Self-pay | Source: Ambulatory Visit | Attending: Ophthalmology

## 2020-07-11 ENCOUNTER — Ambulatory Visit: Payer: Medicare Other | Admitting: Anesthesiology

## 2020-07-11 ENCOUNTER — Encounter: Payer: Self-pay | Admitting: Ophthalmology

## 2020-07-11 ENCOUNTER — Ambulatory Visit
Admission: RE | Admit: 2020-07-11 | Discharge: 2020-07-11 | Disposition: A | Discharge status: Home / Self Care | Payer: Medicare Other | Source: Ambulatory Visit | Attending: Ophthalmology | Admitting: Ophthalmology

## 2020-07-11 ENCOUNTER — Other Ambulatory Visit: Payer: Self-pay

## 2020-07-11 DIAGNOSIS — Z955 Presence of coronary angioplasty implant and graft: Secondary | ICD-10-CM | POA: Diagnosis not present

## 2020-07-11 DIAGNOSIS — H2511 Age-related nuclear cataract, right eye: Secondary | ICD-10-CM | POA: Insufficient documentation

## 2020-07-11 DIAGNOSIS — Z7982 Long term (current) use of aspirin: Secondary | ICD-10-CM | POA: Insufficient documentation

## 2020-07-11 DIAGNOSIS — N183 Chronic kidney disease, stage 3 unspecified: Secondary | ICD-10-CM | POA: Insufficient documentation

## 2020-07-11 DIAGNOSIS — E1122 Type 2 diabetes mellitus with diabetic chronic kidney disease: Secondary | ICD-10-CM | POA: Diagnosis not present

## 2020-07-11 DIAGNOSIS — E1136 Type 2 diabetes mellitus with diabetic cataract: Secondary | ICD-10-CM | POA: Diagnosis not present

## 2020-07-11 DIAGNOSIS — Z87891 Personal history of nicotine dependence: Secondary | ICD-10-CM | POA: Diagnosis not present

## 2020-07-11 DIAGNOSIS — Z79899 Other long term (current) drug therapy: Secondary | ICD-10-CM | POA: Diagnosis not present

## 2020-07-11 DIAGNOSIS — Z882 Allergy status to sulfonamides status: Secondary | ICD-10-CM | POA: Diagnosis not present

## 2020-07-11 DIAGNOSIS — Z7984 Long term (current) use of oral hypoglycemic drugs: Secondary | ICD-10-CM | POA: Insufficient documentation

## 2020-07-11 DIAGNOSIS — Z85828 Personal history of other malignant neoplasm of skin: Secondary | ICD-10-CM | POA: Diagnosis not present

## 2020-07-11 HISTORY — PX: CATARACT EXTRACTION W/PHACO: SHX586

## 2020-07-11 LAB — GLUCOSE, CAPILLARY
Glucose-Capillary: 80 mg/dL (ref 70–99)
Glucose-Capillary: 84 mg/dL (ref 70–99)

## 2020-07-11 SURGERY — PHACOEMULSIFICATION, CATARACT, WITH IOL INSERTION
Anesthesia: Monitor Anesthesia Care | Site: Eye | Laterality: Right

## 2020-07-11 MED ORDER — ACETAMINOPHEN 160 MG/5ML PO SOLN: 325.0000 mg | ORAL | Status: DC | PRN

## 2020-07-11 MED ORDER — MIDAZOLAM HCL 2 MG/2ML IJ SOLN
INTRAMUSCULAR | Status: DC | PRN
  Administered 2020-07-11 (×2): 1 mg via INTRAVENOUS

## 2020-07-11 MED ORDER — LACTATED RINGERS IV SOLN: INTRAVENOUS | Status: DC

## 2020-07-11 MED ORDER — TETRACAINE HCL 0.5 % OP SOLN
1.0000 [drp] | OPHTHALMIC | Status: DC | PRN
  Administered 2020-07-11 (×3): 1 [drp] via OPHTHALMIC

## 2020-07-11 MED ORDER — NA HYALUR & NA CHOND-NA HYALUR 0.4-0.35 ML IO KIT
PACK | INTRAOCULAR | Status: DC | PRN
  Administered 2020-07-11: 1 mL via INTRAOCULAR

## 2020-07-11 MED ORDER — ONDANSETRON HCL 4 MG/2ML IJ SOLN: 4.0000 mg | Freq: Once | INTRAMUSCULAR | Status: DC | PRN

## 2020-07-11 MED ORDER — CEFUROXIME OPHTHALMIC INJECTION 1 MG/0.1 ML
INJECTION | OPHTHALMIC | Status: DC | PRN
  Administered 2020-07-11: 0.1 mL via INTRACAMERAL

## 2020-07-11 MED ORDER — FENTANYL CITRATE (PF) 100 MCG/2ML IJ SOLN
INTRAMUSCULAR | Status: DC | PRN
  Administered 2020-07-11: 25 ug via INTRAVENOUS
  Administered 2020-07-11: 50 ug via INTRAVENOUS

## 2020-07-11 MED ORDER — ACETAMINOPHEN 325 MG PO TABS: 325.0000 mg | ORAL_TABLET | ORAL | Status: DC | PRN

## 2020-07-11 MED ORDER — BRIMONIDINE TARTRATE-TIMOLOL 0.2-0.5 % OP SOLN
OPHTHALMIC | Status: DC | PRN
  Administered 2020-07-11: 1 [drp] via OPHTHALMIC

## 2020-07-11 MED ORDER — EPINEPHRINE PF 1 MG/ML IJ SOLN
INTRAOCULAR | Status: DC | PRN
  Administered 2020-07-11: 69 mL via OPHTHALMIC

## 2020-07-11 MED ORDER — LIDOCAINE HCL (PF) 2 % IJ SOLN
INTRAOCULAR | Status: DC | PRN
  Administered 2020-07-11: 1 mL

## 2020-07-11 MED ORDER — ARMC OPHTHALMIC DILATING DROPS
1.0000 "application " | OPHTHALMIC | Status: DC | PRN
  Administered 2020-07-11 (×3): 1 via OPHTHALMIC

## 2020-07-11 SURGICAL SUPPLY — 29 items
CANNULA ANT/CHMB 27G (MISCELLANEOUS) ×1 IMPLANT
CANNULA ANT/CHMB 27GA (MISCELLANEOUS) ×2 IMPLANT
GLOVE SURG LX 7.5 STRW (GLOVE) ×2
GLOVE SURG LX STRL 7.5 STRW (GLOVE) ×1 IMPLANT
GLOVE SURG TRIUMPH 8.0 PF LTX (GLOVE) ×2 IMPLANT
GOWN STRL REUS W/ TWL LRG LVL3 (GOWN DISPOSABLE) ×2 IMPLANT
GOWN STRL REUS W/TWL LRG LVL3 (GOWN DISPOSABLE) ×4
LENS IOL DIOP 23.5 (Intraocular Lens) ×2 IMPLANT
LENS IOL TECNIS MONO 23.5 (Intraocular Lens) IMPLANT
MARKER SKIN DUAL TIP RULER LAB (MISCELLANEOUS) ×2 IMPLANT
NDL CAPSULORHEX 25GA (NEEDLE) ×1 IMPLANT
NDL FILTER BLUNT 18X1 1/2 (NEEDLE) ×2 IMPLANT
NDL RETROBULBAR .5 NSTRL (NEEDLE) IMPLANT
NEEDLE CAPSULORHEX 25GA (NEEDLE) ×2 IMPLANT
NEEDLE FILTER BLUNT 18X 1/2SAF (NEEDLE) ×2
NEEDLE FILTER BLUNT 18X1 1/2 (NEEDLE) ×2 IMPLANT
PACK CATARACT BRASINGTON (MISCELLANEOUS) ×2 IMPLANT
PACK EYE AFTER SURG (MISCELLANEOUS) ×2 IMPLANT
PACK OPTHALMIC (MISCELLANEOUS) ×2 IMPLANT
RING MALYGIN 7.0 (MISCELLANEOUS) IMPLANT
SOLUTION OPHTHALMIC SALT (MISCELLANEOUS) ×2 IMPLANT
SUT ETHILON 10-0 CS-B-6CS-B-6 (SUTURE)
SUT VICRYL  9 0 (SUTURE)
SUT VICRYL 9 0 (SUTURE) IMPLANT
SUTURE EHLN 10-0 CS-B-6CS-B-6 (SUTURE) IMPLANT
SYR 3ML LL SCALE MARK (SYRINGE) ×4 IMPLANT
SYR TB 1ML LUER SLIP (SYRINGE) ×2 IMPLANT
WATER STERILE IRR 250ML POUR (IV SOLUTION) ×2 IMPLANT
WIPE NON LINTING 3.25X3.25 (MISCELLANEOUS) ×2 IMPLANT

## 2020-07-11 NOTE — Transfer of Care (Signed)
qImmediate Anesthesia Transfer of Care Note  Patient: Glen Jenkins  Procedure(s) Performed: CATARACT EXTRACTION PHACO AND INTRAOCULAR LENS PLACEMENT (IOC) RIGHT DIABETIC (Right Eye)  Patient Location: PACU  Anesthesia Type: MAC  Level of Consciousness: awake, alert  and patient cooperative  Airway and Oxygen Therapy: Patient Spontanous Breathing   Post-op Assessment: Post-op Vital signs reviewed, Patient's Cardiovascular Status Stable, Respiratory Function Stable, Patent Airway and No signs of Nausea or vomiting  Post-op Vital Signs: Reviewed and stable  Complications: No complications documented.

## 2020-07-11 NOTE — Anesthesia Preprocedure Evaluation (Signed)
Anesthesia Evaluation  Patient identified by MRN, date of birth, ID band Patient awake    History of Anesthesia Complications Negative for: history of anesthetic complications  Airway Mallampati: II  TM Distance: >3 FB Neck ROM: Full    Dental no notable dental hx.    Pulmonary sleep apnea , former smoker,    Pulmonary exam normal breath sounds clear to auscultation       Cardiovascular hypertension, + angina (stable angina, on Imdur) + CAD and + Peripheral Vascular Disease (AAA)  Normal cardiovascular exam+ dysrhythmias (pAF, on amiodarone, no AC)  Rhythm:Regular Rate:Normal     Neuro/Psych  Neuromuscular disease    GI/Hepatic Neg liver ROS, GERD  ,  Endo/Other  diabetes, Well Controlled, Type 2, Oral Hypoglycemic Agents  Renal/GU CRFRenal disease (stage III CKD)     Musculoskeletal  (+) Arthritis ,   Abdominal Normal abdominal exam  (+) - obese,  Abdomen: soft.    Peds  Hematology   Anesthesia Other Findings   Reproductive/Obstetrics                             Anesthesia Physical  Anesthesia Plan  ASA: III  Anesthesia Plan: MAC   Post-op Pain Management:    Induction: Intravenous  PONV Risk Score and Plan: 1 and TIVA and Midazolam  Airway Management Planned: Nasal Cannula and Natural Airway  Additional Equipment: None  Intra-op Plan:   Post-operative Plan:   Informed Consent: I have reviewed the patients History and Physical, chart, labs and discussed the procedure including the risks, benefits and alternatives for the proposed anesthesia with the patient or authorized representative who has indicated his/her understanding and acceptance.       Plan Discussed with: CRNA and Surgeon  Anesthesia Plan Comments:         Anesthesia Quick Evaluation  There are no problems to display for this patient.   No flowsheet data found. No flowsheet data  found.  Risks and benefits of anesthesia discussed at length, patient or surrogate demonstrates understanding. Appropriately NPO. Plan to proceed with anesthesia.  Champ Mungo, MD 07/11/20

## 2020-07-11 NOTE — Op Note (Signed)
LOCATION:  [place]   PREOPERATIVE DIAGNOSIS:    Nuclear sclerotic cataract right eye. H25.11   POSTOPERATIVE DIAGNOSIS:  Nuclear sclerotic cataract right eye.     PROCEDURE:  Phacoemusification with posterior chamber intraocular lens placement of the right eye   ULTRASOUND TIME: Procedure(s) with comments: CATARACT EXTRACTION PHACO AND INTRAOCULAR LENS PLACEMENT (IOC) RIGHT DIABETIC (Right) - 9.28 1:15.4 12.3%  LENS:   Implant Name Type Inv. Item Serial No. Manufacturer Lot No. LRB No. Used Action  LENS IOL DIOP 23.5 - [id number] Intraocular Lens LENS IOL DIOP 23.5 [id number] [id number]   Right 1 Implanted         SURGEON:  Wyonia Hough, MD   ANESTHESIA:  Topical with tetracaine drops and 2% Xylocaine jelly, augmented with 1% preservative-free intracameral lidocaine.    COMPLICATIONS:  None.   DESCRIPTION OF PROCEDURE:  The patient was identified in the holding room and transported to the operating room and placed in the supine position under the operating microscope.  The right eye was identified as the operative eye and it was prepped and draped in the usual sterile ophthalmic fashion.   A 1 millimeter clear-corneal paracentesis was made at the 12:00 position.  0.5 ml of preservative-free 1% lidocaine was injected into the anterior chamber. The anterior chamber was filled with Viscoat viscoelastic.  A 2.4 millimeter keratome was used to make a near-clear corneal incision at the 9:00 position.  A curvilinear capsulorrhexis was made with a cystotome and capsulorrhexis forceps.  Balanced salt solution was used to hydrodissect and hydrodelineate the nucleus.   Phacoemulsification was then used in stop and chop fashion to remove the lens nucleus and epinucleus.  The remaining cortex was then removed using the irrigation and aspiration handpiece. Provisc was then placed into the capsular bag to distend it for lens placement.  A lens was then injected into the capsular  bag.  The remaining viscoelastic was aspirated.   Wounds were hydrated with balanced salt solution.  The anterior chamber was inflated to a physiologic pressure with balanced salt solution.  No wound leaks were noted. Cefuroxime 0.1 ml of a 10mg /ml solution was injected into the anterior chamber for a dose of 1 mg of intracameral antibiotic at the completion of the case.   Timolol and Brimonidine drops were applied to the eye.  The patient was taken to the recovery room in stable condition without complications of anesthesia or surgery.   Sedonia Kitner 07/11/2020, 8:34 AM

## 2020-07-11 NOTE — H&P (Signed)

## 2020-07-11 NOTE — Anesthesia Procedure Notes (Signed)
Procedure Name: MAC Date/Time: 07/11/2020 8:15 AM Performed by: Vanetta Shawl, CRNA Pre-anesthesia Checklist: Patient identified, Emergency Drugs available, Suction available, Timeout performed and Patient being monitored Patient Re-evaluated:Patient Re-evaluated prior to induction Oxygen Delivery Method: Nasal cannula Placement Confirmation: positive ETCO2

## 2020-07-11 NOTE — Anesthesia Postprocedure Evaluation (Signed)
Anesthesia Post Note  Patient: Glen Jenkins  Procedure(s) Performed: CATARACT EXTRACTION PHACO AND INTRAOCULAR LENS PLACEMENT (IOC) RIGHT DIABETIC (Right Eye)     Patient location during evaluation: PACU Anesthesia Type: MAC Level of consciousness: awake and alert Pain management: pain level controlled Vital Signs Assessment: post-procedure vital signs reviewed and stable Respiratory status: spontaneous breathing, nonlabored ventilation, respiratory function stable and patient connected to nasal cannula oxygen Cardiovascular status: stable and blood pressure returned to baseline Postop Assessment: no apparent nausea or vomiting Anesthetic complications: no   No complications documented.  Sinda Du

## 2020-11-12 ENCOUNTER — Other Ambulatory Visit: Payer: Self-pay | Admitting: Internal Medicine

## 2020-11-12 DIAGNOSIS — M7989 Other specified soft tissue disorders: Secondary | ICD-10-CM

## 2020-11-14 ENCOUNTER — Ambulatory Visit
Admission: RE | Admit: 2020-11-14 | Discharge: 2020-11-14 | Disposition: A | Discharge status: Home / Self Care | Payer: Medicare Other | Source: Ambulatory Visit | Attending: Internal Medicine | Admitting: Internal Medicine

## 2020-11-14 ENCOUNTER — Other Ambulatory Visit: Payer: Self-pay

## 2020-11-14 DIAGNOSIS — M7989 Other specified soft tissue disorders: Secondary | ICD-10-CM | POA: Diagnosis not present

## 2020-12-06 ENCOUNTER — Encounter: Payer: Self-pay | Admitting: Dietician

## 2020-12-06 ENCOUNTER — Other Ambulatory Visit: Payer: Self-pay

## 2020-12-06 ENCOUNTER — Encounter: Payer: Medicare Other | Attending: Internal Medicine | Admitting: Dietician

## 2020-12-06 VITALS — Ht 72.0 in | Wt 263.2 lb

## 2020-12-06 DIAGNOSIS — E1122 Type 2 diabetes mellitus with diabetic chronic kidney disease: Secondary | ICD-10-CM | POA: Diagnosis present

## 2020-12-06 DIAGNOSIS — N1832 Chronic kidney disease, stage 3b: Secondary | ICD-10-CM | POA: Insufficient documentation

## 2020-12-06 DIAGNOSIS — E118 Type 2 diabetes mellitus with unspecified complications: Secondary | ICD-10-CM | POA: Insufficient documentation

## 2020-12-06 DIAGNOSIS — E7800 Pure hypercholesterolemia, unspecified: Secondary | ICD-10-CM | POA: Insufficient documentation

## 2020-12-06 DIAGNOSIS — N183 Chronic kidney disease, stage 3 unspecified: Secondary | ICD-10-CM

## 2020-12-06 DIAGNOSIS — Z6835 Body mass index (BMI) 35.0-35.9, adult: Secondary | ICD-10-CM | POA: Diagnosis not present

## 2020-12-06 NOTE — Patient Instructions (Signed)
Include a portion of starchy food such as rice, pasta, bread, or cereal with each meal.  Limit high potassium foods including potatoes, tomatoes, melons, and bananas, and dried fruits. Monitor labwork for phosphorus. If levels increase and become elevated then limit high phosphorus foods. Continue to keep sodium low in foods. Mrs Deliah Boston or other herb based seasoning is fine. Avoid salt substitutes that are potassium chloride (NoSalt, Salt Sense, etc)

## 2020-12-06 NOTE — Progress Notes (Signed)
Medical Nutrition Therapy: Visit start time: 1100  end time: 1215  Assessment:  Diagnosis: CKD Stage 3, Type 2 DM, obesity Past medical history: CHF, HTN Psychosocial issues/ stress concerns: none  Preferred learning method:  Visual Hands-on   Current weight: 263.2  Height: 6'0" BMI: 35.7 Medications, supplements: reconciled list in medical record  Progress and evaluation:  Patient reports good DM control 90s-100s, fasting. Checks daily GFR 20 as of 11/29/20; minerals within normal limits (11/09/20) -- potassium 5.0, sodium 143, phosphorus 3.5 Logs food intake with MyFitnessPal, kcal intake has been under 1100kcal in past 2 weeks.  He has been working on weight loss and reports almost 30lb weight loss in recent weeks.  Since he received lab results 2 weeks ago, he has been following renal diet closely with support from his wife. He has been limiting sodium, protein, potassium, and phosphorus. They purchased a kidney health cookbook and are using recipes. He reports eating more fruit, low potassium varieties.  He is feeling hungry and lower in energy since beginning the strict diet changes.   Physical activity: none recently due to low energy  Dietary Intake:  Usual eating pattern includes 3 meals and 0-1 snacks per day. Dining out frequency: 0 recently, was 6 meals per week.  Breakfast: renal diet recipe 200-250kcal before 10am Snack: ? Lunch: 2:30pm -- soups, frittata, salads, chicken Snack: ? Supper: recently crab cakes with low sodium salsa 350kcal Snack: ? Beverages: almond or rice milk  Nutrition Care Education: Topics covered:  Basic nutrition: basic food groups, appropriate nutrient balance, appropriate meal and snack schedule, general nutrition guidelines    Weight control: determining reasonable weight loss rate Advanced nutrition: dining out, food label reading for sodium, potassium Diabetes:  appropriate meal and snack schedule, appropriate carb intake and balance,  healthy carb choices, role of protein, fat Hypertension: identifying high sodium foods, role of potassium in BP control Renal diet: adequate protein to meet needs based on body weight and kcal needs -- advised 60-80g daily (20% of kcal needs = 75g); low sodium, moderate potassium and avoiding high phosphorus intake especially from processed foods -- based on lab results  Nutritional Diagnosis:  [place]-2.2 Altered nutrition-related laboratory As related to chronic kidney disease and Type 2 diabetes.  As evidenced by recent GFR 20, HbA1C of 6.5%. -3.3 Overweight/obesity As related to excess calories, limited physical activity.  As evidenced by patient with current BMI of 35.7, following calorie restricted diet to promote weight loss.  Intervention:  Instruction and discussion as noted above. Patient seems to have significantly reduced caloric intake and is now consuming below his basic daily needs.  Advised addition of some starches and vegetables and fruits that avoid multiple daily servings of high potassium and phosphorus foods. Advised small - moderate portions of healthy fats with meals.  Established nutrition goals with input from patient.  No follow up scheduled at this time; patient will schedule after next lab tests if needed.   Education Materials given:  Museum/gallery conservator with food lists, sample meal pattern Renal disease food pyramid ([facility]) Potassium content of foods (NIH) Steps to control kidney disease (NDKEP) Visit summary with goals/ instructions   Learner/ who was taught:  Patient  Spouse/ partner  Level of understanding: Verbalizes/ demonstrates competency   Demonstrated degree of understanding via:   Teach back Learning barriers: None  Willingness to learn/ readiness for change: Eager, change in progress   Monitoring and Evaluation:  Dietary intake, exercise, renal function, BG control, and body weight  follow up: prn

## 2021-07-22 ENCOUNTER — Other Ambulatory Visit: Payer: Self-pay | Admitting: Internal Medicine

## 2021-07-22 ENCOUNTER — Other Ambulatory Visit (HOSPITAL_COMMUNITY): Payer: Self-pay | Admitting: Internal Medicine

## 2021-07-22 DIAGNOSIS — R5381 Other malaise: Secondary | ICD-10-CM

## 2021-07-22 DIAGNOSIS — R400 Somnolence: Secondary | ICD-10-CM

## 2021-07-22 DIAGNOSIS — R5383 Other fatigue: Secondary | ICD-10-CM

## 2021-08-01 ENCOUNTER — Ambulatory Visit
Admission: RE | Admit: 2021-08-01 | Discharge: 2021-08-01 | Disposition: A | Discharge status: Home / Self Care | Payer: Medicare Other | Source: Ambulatory Visit | Attending: Internal Medicine | Admitting: Internal Medicine

## 2021-08-01 ENCOUNTER — Other Ambulatory Visit: Payer: Self-pay

## 2021-08-01 DIAGNOSIS — R400 Somnolence: Secondary | ICD-10-CM | POA: Insufficient documentation

## 2021-08-01 DIAGNOSIS — R5383 Other fatigue: Secondary | ICD-10-CM | POA: Diagnosis present

## 2021-08-01 DIAGNOSIS — R5381 Other malaise: Secondary | ICD-10-CM | POA: Diagnosis present

## 2022-01-14 ENCOUNTER — Ambulatory Visit: Payer: Medicare Other | Admitting: Podiatry

## 2022-01-14 ENCOUNTER — Encounter: Payer: Self-pay | Admitting: Podiatry

## 2022-01-14 DIAGNOSIS — Q828 Other specified congenital malformations of skin: Secondary | ICD-10-CM

## 2022-01-14 NOTE — Progress Notes (Signed)
Subjective:  Patient ID: Glen Jenkins, male    DOB: 1941-03-19,  MRN: [id number]  Chief Complaint  Patient presents with   Callouses    81 y.o. male presents with the above complaint.  Patient presents with complaint of right submetatarsal 5 porokeratosis.  Patient said pain for touch is progressive gotten worse.  Hurts with ambulation.  Hurts with pressure.  Patient states he is a diabetic.  Is very tender to touch she wanted get it evaluated he is on blood thinner he has not seen anyone else prior to seeing me.  Denies any other acute complaints.   Review of Systems: Negative except as noted in the HPI. Denies N/V/F/Ch.  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 (Patient not taking: Reported on 12/06/2020), Disp: , Rfl:    b complex vitamins capsule, Take 1 capsule by mouth daily. (Patient not taking: Reported on 12/06/2020), Disp: , Rfl:    Cholecalciferol (VITAMIN D-3) 125 MCG (5000 UT) TABS, Take by mouth daily. (Patient not taking: Reported on 12/06/2020), Disp: , Rfl:    colchicine 0.6 MG tablet, , Disp: , Rfl:    docusate calcium (SURFAK) 240 MG capsule, Take 240 mg by mouth  daily., Disp: , Rfl:    ELIQUIS 5 MG TABS tablet, Take 5 mg by mouth 2 (two) times daily., Disp: , Rfl:    furosemide (LASIX) 20 MG tablet, Take 20 mg by mouth., Disp: , Rfl:    glipiZIDE (GLUCOTROL XL) 10 MG 24 hr tablet, Take 10 mg by mouth daily with breakfast., Disp: , Rfl:    isosorbide mononitrate (IMDUR) 30 MG 24 hr tablet, Take 30 mg by mouth daily., Disp: , Rfl:    lisinopril (ZESTRIL) 40 MG tablet, Take 1 tablet by mouth daily., Disp: , Rfl:    metFORMIN (GLUCOPHAGE) 1000 MG tablet, Take 1,000 mg by mouth 2 (two) times daily with a meal., Disp: , Rfl:    Multiple Vitamin (ANTIOXIDANT A/C/E PO), Take 1 tablet by mouth daily. (Patient not taking: Reported on 12/06/2020), Disp: , Rfl:    simvastatin (ZOCOR) 40 MG tablet, Take 40 mg by mouth daily at 6 PM., Disp: , Rfl:   Social History   Tobacco Use  Smoking Status Former   Types: Cigarettes   Quit date: 1999   Years since quitting: 24.6  Smokeless Tobacco Never    Allergies  Allergen Reactions   Sulfa Antibiotics    Objective:  There were no vitals filed for  this visit. There is no height or weight on file to calculate BMI. Constitutional Well developed. Well nourished.  Vascular Dorsalis pedis pulses palpable bilaterally. Posterior tibial pulses palpable bilaterally. Capillary refill normal to all digits.  No cyanosis or clubbing noted. Pedal hair growth normal.  Neurologic Normal speech. Oriented to person, place, and time. Epicritic sensation to light touch grossly present bilaterally.  Dermatologic Hyperkeratotic lesion with central nucleated core noted to right submetatarsal 5.  Pain on palpation to the lesion.  No pinpoint bleeding noted upon debridement  Orthopedic: Normal joint ROM without pain or crepitus bilaterally. No visible deformities. No bony tenderness.   Radiographs: None Assessment:   1. Porokeratosis    Plan:  Patient was evaluated and treated and all questions answered.  Right submetatarsal  5 porokeratosis -All questions and concerns were discussed with the patient in extensive detail -I explained to patient the etiology of porokeratosis emergency room and options were discussed.  Given the amount of pain that he is having a benefit from aggressive debridement of the lesion using chisel blade handle the lesion was begun out destroyed tissue.  This was followed by central nucleated core excision.  No pinpoint bleeding noted. -I discussed shoe gear modification and offloading pads Return in about 3 months (around 04/16/2022) for [employer] .

## 2022-05-05 ENCOUNTER — Encounter: Payer: Self-pay | Admitting: Podiatry

## 2022-05-05 ENCOUNTER — Ambulatory Visit: Payer: Medicare Other | Admitting: Podiatry

## 2022-05-05 VITALS — BP 160/71 | HR 53

## 2022-05-05 DIAGNOSIS — B351 Tinea unguium: Secondary | ICD-10-CM | POA: Diagnosis not present

## 2022-05-05 DIAGNOSIS — E119 Type 2 diabetes mellitus without complications: Secondary | ICD-10-CM

## 2022-05-05 DIAGNOSIS — Z794 Long term (current) use of insulin: Secondary | ICD-10-CM

## 2022-05-05 DIAGNOSIS — M79675 Pain in left toe(s): Secondary | ICD-10-CM | POA: Diagnosis not present

## 2022-05-05 DIAGNOSIS — M79674 Pain in right toe(s): Secondary | ICD-10-CM

## 2022-05-05 NOTE — Progress Notes (Signed)
This patient returns to my office for at risk foot care.  This patient requires this care by a professional since this patient will be at risk due to having diabetes with no foot complications.  This patient is unable to cut nails himself since the patient cannot reach his nails.These nails are painful walking and wearing shoes. His callus under his right foot has resolved with padding. This patient presents for at risk foot care today.  General Appearance  Alert, conversant and in no acute stress.  Vascular  Dorsalis pedis and posterior tibial  pulses are palpable  bilaterally.  Capillary return is within normal limits  bilaterally. Temperature is within normal limits  bilaterally.  Neurologic  Senn-Weinstein monofilament wire test within normal limits  bilaterally. Muscle power within normal limits bilaterally.  Nails Thick disfigured discolored nails with subungual debris  from hallux to fifth toes bilaterally. No evidence of bacterial infection or drainage bilaterally.  Orthopedic  No limitations of motion  feet .  No crepitus or effusions noted.  No bony pathology or digital deformities noted.  Skin  normotropic skin with no porokeratosis noted bilaterally.  No signs of infections or ulcers noted.   S/P callus sub 5 right foot.  Onychomycosis  Pain in right toes  Pain in left toes  Consent was obtained for treatment procedures.   Mechanical debridement of nails 1-5  bilaterally performed with a nail nipper.  Filed with dremel without incident.    Return office visit  3 months            Told patient to return for periodic foot care and evaluation due to potential at risk complications.   Gardiner Barefoot DPM

## 2022-06-27 ENCOUNTER — Other Ambulatory Visit: Payer: Self-pay | Admitting: *Deleted

## 2022-07-03 ENCOUNTER — Other Ambulatory Visit: Payer: Self-pay | Admitting: Pulmonary Disease

## 2022-07-03 DIAGNOSIS — J849 Interstitial pulmonary disease, unspecified: Secondary | ICD-10-CM

## 2022-07-03 DIAGNOSIS — R0602 Shortness of breath: Secondary | ICD-10-CM

## 2022-07-16 ENCOUNTER — Ambulatory Visit
Admission: RE | Admit: 2022-07-16 | Discharge: 2022-07-16 | Disposition: A | Discharge status: Home / Self Care | Payer: Medicare Other | Source: Ambulatory Visit | Attending: Pulmonary Disease | Admitting: Pulmonary Disease

## 2022-07-16 DIAGNOSIS — J849 Interstitial pulmonary disease, unspecified: Secondary | ICD-10-CM | POA: Insufficient documentation

## 2022-07-16 DIAGNOSIS — R0602 Shortness of breath: Secondary | ICD-10-CM | POA: Diagnosis present

## 2022-08-07 ENCOUNTER — Ambulatory Visit: Payer: Medicare Other | Admitting: Podiatry

## 2022-08-07 ENCOUNTER — Encounter: Payer: Self-pay | Admitting: Podiatry

## 2022-08-07 DIAGNOSIS — E119 Type 2 diabetes mellitus without complications: Secondary | ICD-10-CM

## 2022-08-07 DIAGNOSIS — Z794 Long term (current) use of insulin: Secondary | ICD-10-CM | POA: Diagnosis not present

## 2022-08-07 DIAGNOSIS — M79675 Pain in left toe(s): Secondary | ICD-10-CM

## 2022-08-07 DIAGNOSIS — B351 Tinea unguium: Secondary | ICD-10-CM | POA: Diagnosis not present

## 2022-08-07 DIAGNOSIS — M79674 Pain in right toe(s): Secondary | ICD-10-CM

## 2022-08-07 NOTE — Progress Notes (Addendum)
This patient returns to my office for at risk foot care.  This patient requires this care by a professional since this patient will be at risk due to having diabetes with no foot complications.  This patient is unable to cut nails himself since the patient cannot reach his nails.These nails are painful walking and wearing shoes. His callus under his right foot has resolved with padding. This patient presents for at risk foot care today.  General Appearance  Alert, conversant and in no acute stress.  Vascular  Dorsalis pedis and posterior tibial  pulses are weakly  palpable  bilaterally.  Capillary return is within normal limits  bilaterally. Temperature is within normal limits  bilaterally.  Neurologic  Senn-Weinstein monofilament wire test within normal limits  bilaterally. Muscle power within normal limits bilaterally.  Nails Thick disfigured discolored nails with subungual debris  from hallux to fifth toes bilaterally. No evidence of bacterial infection or drainage bilaterally.  Orthopedic  No limitations of motion  feet .  No crepitus or effusions noted.  No bony pathology or digital deformities noted.  Skin  normotropic skin with no porokeratosis noted bilaterally.  No signs of infections or ulcers noted.   S/P callus sub 5 right foot.  Onychomycosis  Pain in right toes  Pain in left toes  Consent was obtained for treatment procedures.   Mechanical debridement of nails 1-5  bilaterally performed with a nail nipper.  Filed with dremel without incident.    Return office visit  10 weeks          Told patient to return for periodic foot care and evaluation due to potential at risk complications.   Gardiner Barefoot DPM

## 2022-10-16 ENCOUNTER — Ambulatory Visit: Payer: Medicare Other | Admitting: Podiatry

## 2022-10-28 ENCOUNTER — Ambulatory Visit: Payer: Medicare Other | Admitting: Podiatry

## 2022-10-28 DIAGNOSIS — E119 Type 2 diabetes mellitus without complications: Secondary | ICD-10-CM | POA: Diagnosis not present

## 2022-10-28 DIAGNOSIS — Z794 Long term (current) use of insulin: Secondary | ICD-10-CM | POA: Diagnosis not present

## 2022-10-28 DIAGNOSIS — B351 Tinea unguium: Secondary | ICD-10-CM

## 2022-10-28 DIAGNOSIS — M79674 Pain in right toe(s): Secondary | ICD-10-CM

## 2022-10-28 DIAGNOSIS — M79675 Pain in left toe(s): Secondary | ICD-10-CM

## 2022-10-28 NOTE — Progress Notes (Signed)
This patient returns to my office for at risk foot care.  This patient requires this care by a professional since this patient will be at risk due to having diabetes with no foot complications.  This patient is unable to cut nails himself since the patient cannot reach his nails.These nails are painful walking and wearing shoes. His callus under his right foot has resolved with padding. This patient presents for at risk foot care today.  General Appearance  Alert, conversant and in no acute stress.  Vascular  Dorsalis pedis and posterior tibial  pulses are weakly  palpable  bilaterally.  Capillary return is within normal limits  bilaterally. Temperature is within normal limits  bilaterally.  Neurologic  Senn-Weinstein monofilament wire test within normal limits  bilaterally. Muscle power within normal limits bilaterally.  Nails Thick disfigured discolored nails with subungual debris  from hallux to fifth toes bilaterally. No evidence of bacterial infection or drainage bilaterally.  Orthopedic  No limitations of motion  feet .  No crepitus or effusions noted.  No bony pathology or digital deformities noted.  Skin  normotropic skin with no porokeratosis noted bilaterally.  No signs of infections or ulcers noted.   S/P callus sub 5 right foot.  Onychomycosis  Pain in right toes  Pain in left toes  Consent was obtained for treatment procedures.   Mechanical debridement of nails 1-5  bilaterally performed with a nail nipper.  Filed with dremel without incident.    Return office visit  10 weeks          Told patient to return for periodic foot care and evaluation due to potential at risk complications.   Nicholes Rough D.P.M.

## 2022-11-19 ENCOUNTER — Other Ambulatory Visit: Payer: Self-pay

## 2022-11-19 ENCOUNTER — Encounter: Payer: Medicare Other | Attending: Pulmonary Disease

## 2022-11-19 DIAGNOSIS — J449 Chronic obstructive pulmonary disease, unspecified: Secondary | ICD-10-CM | POA: Insufficient documentation

## 2022-11-19 NOTE — Progress Notes (Signed)
Virtual Visit completed. Patient informed on EP and RD appointment and 6 Minute walk test. Patient also informed of patient health questionnaires on My Chart. Patient Verbalizes understanding. Visit diagnosis can be found in [facility] 08/06/2022.

## 2022-11-27 VITALS — Ht 73.0 in | Wt 279.6 lb

## 2022-11-27 DIAGNOSIS — J449 Chronic obstructive pulmonary disease, unspecified: Secondary | ICD-10-CM

## 2022-11-27 NOTE — Progress Notes (Signed)
Pulmonary Individual Treatment Plan  Patient Details  Name: Glen Jenkins MRN: [id number] Date of Birth: 1941-02-28 Referring Provider:   Flowsheet Row Pulmonary Rehab from 11/27/2022 in [facility] Cardiac and Pulmonary Rehab  Referring Provider Dr. Vida Rigger       Initial Encounter Date:  Flowsheet Row Pulmonary Rehab from 11/27/2022 in [facility] Cardiac and Pulmonary Rehab  Date 11/27/22       Visit Diagnosis: Chronic obstructive pulmonary disease, unspecified COPD type (HCC)  Patient's Home Medications on Admission:  Current Outpatient Medications:    acetaminophen (TYLENOL) 500 MG tablet, Take by mouth., Disp: , Rfl:    allopurinol (ZYLOPRIM) 100 MG tablet, Take by mouth., Disp: , Rfl:    amiodarone (PACERONE) 200 MG tablet, Take 0.5 tablets by mouth daily. (Patient not taking: Reported on 11/19/2022), Disp: , Rfl:    amiodarone (PACERONE) 200 MG tablet, Take by mouth., Disp: , Rfl:    apixaban (ELIQUIS) 5 MG TABS tablet, Take by mouth., Disp: , Rfl:    Ascorbic Acid (VITAMIN C) 1000 MG tablet, Take 1,000 mg by mouth daily. (Patient not taking: Reported on 11/19/2022), Disp: , Rfl:    b complex vitamins capsule, Take 1 capsule by mouth daily. (Patient not taking: Reported on 12/06/2020), Disp: , Rfl:    budesonide (PULMICORT) 0.5 MG/2ML nebulizer solution, Inhale into the lungs., Disp: , Rfl:    Cholecalciferol (VITAMIN D-3) 125 MCG (5000 UT) TABS, Take by mouth daily. (Patient not taking: Reported on 12/06/2020), Disp: , Rfl:    colchicine 0.6 MG tablet, , Disp: , Rfl:    CONTOUR NEXT TEST test strip, once daily Use as instructed. (Patient not taking: Reported on 11/19/2022), Disp: , Rfl:    docusate calcium (SURFAK) 240 MG capsule, Take 240 mg by mouth daily., Disp: , Rfl:    docusate calcium (SURFAK) 240 MG capsule, Take by mouth. (Patient not taking: Reported on 11/19/2022), Disp: , Rfl:    ELIQUIS 5 MG TABS tablet, Take 5 mg by mouth 2 (two) times daily., Disp: , Rfl:    furosemide  (LASIX) 20 MG tablet, Take 20 mg by mouth. (Patient not taking: Reported on 11/19/2022), Disp: , Rfl:    furosemide (LASIX) 40 MG tablet, Take 1 tablet by mouth 2 (two) times daily., Disp: , Rfl:    glipiZIDE (GLUCOTROL XL) 10 MG 24 hr tablet, Take 10 mg by mouth daily with breakfast. (Patient not taking: Reported on 11/19/2022), Disp: , Rfl:    glipiZIDE (GLUCOTROL XL) 10 MG 24 hr tablet, Take 1 tablet by mouth daily., Disp: , Rfl:    ipratropium-albuterol (DUONEB) 0.5-2.5 (3) MG/3ML SOLN, Inhale into the lungs., Disp: , Rfl:    isosorbide mononitrate (IMDUR) 30 MG 24 hr tablet, Take 30 mg by mouth daily., Disp: , Rfl:    lisinopril (ZESTRIL) 40 MG tablet, Take 1 tablet by mouth daily. (Patient not taking: Reported on 11/19/2022), Disp: , Rfl:    lisinopril (ZESTRIL) 40 MG tablet, Take by mouth., Disp: , Rfl:    metFORMIN (GLUCOPHAGE) 1000 MG tablet, Take 1,000 mg by mouth 2 (two) times daily with a meal. (Patient not taking: Reported on 05/05/2022), Disp: , Rfl:    Microlet Lancets MISC, once daily Use as instructed., Disp: , Rfl:    Multiple Vitamin (ANTIOXIDANT A/C/E PO), Take 1 tablet by mouth daily. (Patient not taking: Reported on 12/06/2020), Disp: , Rfl:    Multiple Vitamins-Minerals (ICAPS AREDS 2 PO), Take by mouth. (Patient not taking: Reported on 11/19/2022), Disp: ,  Rfl:    simvastatin (ZOCOR) 40 MG tablet, Take 40 mg by mouth daily at 6 PM. (Patient not taking: Reported on 11/19/2022), Disp: , Rfl:    simvastatin (ZOCOR) 40 MG tablet, Take 1 tablet by mouth at bedtime., Disp: , Rfl:    TRELEGY ELLIPTA 100-62.5-25 MCG/ACT AEPB, Inhale into the lungs., Disp: , Rfl:   Past Medical History: Past Medical History:  Diagnosis Date   AAA (abdominal aortic aneurysm) (HCC)    Anginal pain (HCC)    Arthritis    lower back   Cancer (HCC)    Lung Cancer; Squamous Cell Carcinoma   Cardiomyopathy (HCC)    Coronary artery disease    CTS (carpal tunnel syndrome)    right - numbness thumb and 1st  and 2nd fingers   Diabetes mellitus without complication (HCC)    Dysrhythmia    Atrial Fibrillation   GERD (gastroesophageal reflux disease)    Hypercholesterolemia    Hypertension    Nephropathy, diabetic (HCC)    Onychomycosis    Renal insufficiency    Sleep apnea    Varicose veins    Wears dentures    partial upper   Wears hearing aid in both ears     Tobacco Use: Social History   Tobacco Use  Smoking Status Former   Packs/day: 3.00   Years: 42.00   Additional pack years: 0.00   Total pack years: 126.00   Types: Cigarettes   Quit date: 1999   Years since quitting: 25.5  Smokeless Tobacco Never    Labs: Review Flowsheet        No data to display           Pulmonary Assessment Scores:  Pulmonary Assessment Scores     Row Name 11/27/22 1204         ADL UCSD   ADL Phase Entry     SOB Score total 26     Rest 0     Walk 2     Stairs 3     Bath 0     Dress 1     Shop 1       CAT Score   CAT Score 18       mMRC Score   mMRC Score 2              UCSD: Self-administered rating of dyspnea associated with activities of daily living (ADLs) 6-point scale (0 = "not at all" to 5 = "maximal or unable to do because of breathlessness")  Scoring Scores range from 0 to 120.  Minimally important difference is 5 units  CAT: CAT can identify the health impairment of COPD patients and is better correlated with disease progression.  CAT has a scoring range of zero to 40. The CAT score is classified into four groups of low (less than 10), medium (10 - 20), high (21-30) and very high (31-40) based on the impact level of disease on health status. A CAT score over 10 suggests significant symptoms.  A worsening CAT score could be explained by an exacerbation, poor medication adherence, poor inhaler technique, or progression of COPD or comorbid conditions.  CAT MCID is 2 points  mMRC: mMRC (Modified Medical Research Council) Dyspnea Scale is used to assess the  degree of baseline functional disability in patients of respiratory disease due to dyspnea. No minimal important difference is established. A decrease in score of 1 point or greater is considered a positive change.   Pulmonary Function Assessment:  Pulmonary Function Assessment - 11/19/22 1348       Breath   Shortness of Breath Yes;Limiting activity             Exercise Target Goals: Exercise Program Goal: Individual exercise prescription set using results from initial 6 min walk test and THRR while considering  patient's activity barriers and safety.   Exercise Prescription Goal: Initial exercise prescription builds to 30-45 minutes a day of aerobic activity, 2-3 days per week.  Home exercise guidelines will be given to patient during program as part of exercise prescription that the participant will acknowledge.  Education: Aerobic Exercise: - Group verbal and visual presentation on the components of exercise prescription. Introduces F.I.T.T principle from ACSM for exercise prescriptions.  Reviews F.I.T.T. principles of aerobic exercise including progression. Written material given at graduation.   Education: Resistance Exercise: - Group verbal and visual presentation on the components of exercise prescription. Introduces F.I.T.T principle from ACSM for exercise prescriptions  Reviews F.I.T.T. principles of resistance exercise including progression. Written material given at graduation.    Education: Exercise & Equipment Safety: - Individual verbal instruction and demonstration of equipment use and safety with use of the equipment. Flowsheet Row Pulmonary Rehab from 11/27/2022 in [facility] Cardiac and Pulmonary Rehab  Date 11/19/22  Educator [facility]  Instruction Review Code 1- Verbalizes Understanding       Education: Exercise Physiology & General Exercise Guidelines: - Group verbal and written instruction with models to review the exercise physiology of the cardiovascular system and  associated critical values. Provides general exercise guidelines with specific guidelines to those with heart or lung disease.    Education: Flexibility, Balance, Mind/Body Relaxation: - Group verbal and visual presentation with interactive activity on the components of exercise prescription. Introduces F.I.T.T principle from ACSM for exercise prescriptions. Reviews F.I.T.T. principles of flexibility and balance exercise training including progression. Also discusses the mind body connection.  Reviews various relaxation techniques to help reduce and manage stress (i.e. Deep breathing, progressive muscle relaxation, and visualization). Balance handout provided to take home. Written material given at graduation.   Activity Barriers & Risk Stratification:  Activity Barriers & Cardiac Risk Stratification - 11/27/22 1346       Activity Barriers & Cardiac Risk Stratification   Activity Barriers Neck/Spine Problems;Back Problems             6 Minute Walk:  6 Minute Walk     Row Name 11/27/22 1341         6 Minute Walk   Phase Initial     Distance 900 feet     Walk Time 6 minutes     # of Rest Breaks 0     MPH 1.7     METS 1.26     RPE 13     Perceived Dyspnea  3     VO2 Peak 4.39     Symptoms Yes (comment)     Comments SOB     Resting HR 53 bpm     Resting BP 138/56     Resting Oxygen Saturation  95 %     Exercise Oxygen Saturation  during 6 min walk 91 %     Max Ex. HR 110 bpm     Max Ex. BP 158/62     2 Minute Post BP 134/58       Interval HR   1 Minute HR 68     2 Minute HR 79     3 Minute HR 86  4 Minute HR 110     5 Minute HR 106     6 Minute HR 98     2 Minute Post HR 66     Interval Heart Rate? Yes       Interval Oxygen   Interval Oxygen? Yes     Baseline Oxygen Saturation % 95 %     1 Minute Oxygen Saturation % 95 %     1 Minute Liters of Oxygen 0 L  RA     2 Minute Oxygen Saturation % 95 %     2 Minute Liters of Oxygen 0 L  RA     3 Minute Oxygen  Saturation % 97 %     3 Minute Liters of Oxygen 0 L  RA     4 Minute Oxygen Saturation % 95 %     4 Minute Liters of Oxygen 0 L  RA     5 Minute Oxygen Saturation % 94 %     5 Minute Liters of Oxygen 0 L  RA     6 Minute Oxygen Saturation % 91 %     6 Minute Liters of Oxygen 0 L  RA     2 Minute Post Oxygen Saturation % 98 %     2 Minute Post Liters of Oxygen 0 L  RA             Oxygen Initial Assessment:  Oxygen Initial Assessment - 11/19/22 1347       Home Oxygen   Home Oxygen Device None    Sleep Oxygen Prescription None    Home Exercise Oxygen Prescription None    Home Resting Oxygen Prescription None      Initial 6 min Walk   Oxygen Used None      Program Oxygen Prescription   Program Oxygen Prescription None      Intervention   Short Term Goals To learn and understand importance of monitoring SPO2 with pulse oximeter and demonstrate accurate use of the pulse oximeter.;To learn and demonstrate proper pursed lip breathing techniques or other breathing techniques. ;To learn and understand importance of maintaining oxygen saturations>88%;To learn and demonstrate proper use of respiratory medications;To learn and exhibit compliance with exercise, home and travel O2 prescription    Long  Term Goals Exhibits compliance with exercise, home  and travel O2 prescription;Verbalizes importance of monitoring SPO2 with pulse oximeter and return demonstration;Exhibits proper breathing techniques, such as pursed lip breathing or other method taught during program session;Demonstrates proper use of MDI's;Compliance with respiratory medication;Maintenance of O2 saturations>88%             Oxygen Re-Evaluation:   Oxygen Discharge (Final Oxygen Re-Evaluation):   Initial Exercise Prescription:  Initial Exercise Prescription - 11/27/22 1300       Date of Initial Exercise RX and Referring Provider   Date 11/27/22    Referring Provider Dr. Vida Rigger      Oxygen   Maintain  Oxygen Saturation 88% or higher      Treadmill   MPH 1.4    Grade 0    Minutes 15    METs 2.07      NuStep   Level 2    SPM 80    Minutes 15    METs 1.26      Biostep-RELP   Level 1    SPM 50    Minutes 15    METs 1.26      Prescription Details   Frequency (times  per week) 2    Duration Progress to 30 minutes of continuous aerobic without signs/symptoms of physical distress      Intensity   THRR 40-80% of Max Heartrate 86-120    Ratings of Perceived Exertion 11-13    Perceived Dyspnea 0-4      Progression   Progression Continue to progress workloads to maintain intensity without signs/symptoms of physical distress.      Resistance Training   Training Prescription Yes    Weight 4 lb    Reps 10-15             Perform Capillary Blood Glucose checks as needed.  Exercise Prescription Changes:   Exercise Prescription Changes     Row Name 11/27/22 1300             Response to Exercise   Blood Pressure (Admit) 138/56       Blood Pressure (Exercise) 158/62       Blood Pressure (Exit) 134/58       Heart Rate (Admit) 53 bpm       Heart Rate (Exercise) 110 bpm       Heart Rate (Exit) 66 bpm       Oxygen Saturation (Admit) 95 %       Oxygen Saturation (Exercise) 91 %       Oxygen Saturation (Exit) 98 %       Rating of Perceived Exertion (Exercise) 13       Perceived Dyspnea (Exercise) 3       Symptoms SOB       Comments Results                Exercise Comments:   Exercise Goals and Review:   Exercise Goals     Row Name 11/27/22 1247             Exercise Goals   Increase Physical Activity Yes       Intervention Provide advice, education, support and counseling about physical activity/exercise needs.;Develop an individualized exercise prescription for aerobic and resistive training based on initial evaluation findings, risk stratification, comorbidities and participant's personal goals.       Expected Outcomes Short Term: Attend rehab  on a regular basis to increase amount of physical activity.;Long Term: Add in home exercise to make exercise part of routine and to increase amount of physical activity.;Long Term: Exercising regularly at least 3-5 days a week.       Increase Strength and Stamina Yes       Intervention Provide advice, education, support and counseling about physical activity/exercise needs.;Develop an individualized exercise prescription for aerobic and resistive training based on initial evaluation findings, risk stratification, comorbidities and participant's personal goals.       Expected Outcomes Short Term: Increase workloads from initial exercise prescription for resistance, speed, and METs.;Long Term: Improve cardiorespiratory fitness, muscular endurance and strength as measured by increased METs and functional capacity ( );Short Term: Perform resistance training exercises routinely during rehab and add in resistance training at home       Able to understand and use rate of perceived exertion (RPE) scale Yes       Intervention Provide education and explanation on how to use RPE scale       Expected Outcomes Short Term: Able to use RPE daily in rehab to express subjective intensity level;Long Term:  Able to use RPE to guide intensity level when exercising independently       Able to understand and use Dyspnea  scale Yes       Intervention Provide education and explanation on how to use Dyspnea scale       Expected Outcomes Short Term: Able to use Dyspnea scale daily in rehab to express subjective sense of shortness of breath during exertion;Long Term: Able to use Dyspnea scale to guide intensity level when exercising independently       Knowledge and understanding of Target Heart Rate Range (THRR) Yes       Intervention Provide education and explanation of THRR including how the numbers were predicted and where they are located for reference       Expected Outcomes Short Term: Able to state/look up THRR;Long  Term: Able to use THRR to govern intensity when exercising independently;Short Term: Able to use daily as guideline for intensity in rehab       Able to check pulse independently Yes       Intervention Provide education and demonstration on how to check pulse in carotid and radial arteries.;Review the importance of being able to check your own pulse for safety during independent exercise       Expected Outcomes Short Term: Able to explain why pulse checking is important during independent exercise;Long Term: Able to check pulse independently and accurately       Understanding of Exercise Prescription Yes       Intervention Provide education, explanation, and written materials on patient's individual exercise prescription       Expected Outcomes Short Term: Able to explain program exercise prescription;Long Term: Able to explain home exercise prescription to exercise independently                Exercise Goals Re-Evaluation :   Discharge Exercise Prescription (Final Exercise Prescription Changes):  Exercise Prescription Changes - 11/27/22 1300       Response to Exercise   Blood Pressure (Admit) 138/56    Blood Pressure (Exercise) 158/62    Blood Pressure (Exit) 134/58    Heart Rate (Admit) 53 bpm    Heart Rate (Exercise) 110 bpm    Heart Rate (Exit) 66 bpm    Oxygen Saturation (Admit) 95 %    Oxygen Saturation (Exercise) 91 %    Oxygen Saturation (Exit) 98 %    Rating of Perceived Exertion (Exercise) 13    Perceived Dyspnea (Exercise) 3    Symptoms SOB    Comments Results             Nutrition:  Target Goals: Understanding of nutrition guidelines, daily intake of sodium 1500mg , cholesterol 200mg , calories 30% from fat and 7% or less from saturated fats, daily to have 5 or more servings of fruits and vegetables.  Education: All About Nutrition: -Group instruction provided by verbal, written material, interactive activities, discussions, models, and posters to  present general guidelines for heart healthy nutrition including fat, fiber, MyPlate, the role of sodium in heart healthy nutrition, utilization of the nutrition label, and utilization of this knowledge for meal planning. Follow up email sent as well. Written material given at graduation. Flowsheet Row Pulmonary Rehab from 11/27/2022 in [facility] Cardiac and Pulmonary Rehab  Education need identified 11/27/22       Biometrics:  Pre Biometrics - 11/27/22 1346       Pre Biometrics   Height 6\' 1"  (1.854 m)    Weight 279 lb 9.6 oz (126.8 kg)    Waist Circumference 50 inches    Hip Circumference 53 inches    Waist to Hip Ratio 0.94 %  BMI (Calculated) 36.9    Single Leg Stand 0 seconds              Nutrition Therapy Plan and Nutrition Goals:  Nutrition Therapy & Goals - 11/27/22 1131       Nutrition Therapy   Diet Mediterranean    Protein (specify units) 90    Fiber 30 grams    Whole Grain Foods 3 servings    Saturated Fats 15 max. grams    Fruits and Vegetables 5 servings/day    Sodium 1.5 grams      Personal Nutrition Goals   Nutrition Goal Eat a protein at every meal (use protein shake if needed)    Personal Goal #2 Small frequent meals    Personal Goal #3 Try lentils as plant based protein    Comments He reports he doesn't eat breakfast, has a large lunch, small dinner. He says dinner is all veggies. Talked tp him about adding protein. Reviewed primer protein shake, low calorie (he is trying to lose weight) but RD still wants to ensure he has adequate protein intake. He also likes salmon. He is not sure if he has tried lentils, encouraged him to try them. His wife reads labels and watches for sodium and fat content. He eats out a lot, wants to work on eating out less. Reviewed mediterranean diet handout, focusing on eating small frequent meals, protein at every meal.      Intervention Plan   Intervention Prescribe, educate and counsel regarding individualized specific  dietary modifications aiming towards targeted core components such as weight, hypertension, lipid management, diabetes, heart failure and other comorbidities.;Nutrition handout(s) given to patient.    Expected Outcomes Short Term Goal: Understand basic principles of dietary content, such as calories, fat, sodium, cholesterol and nutrients.;Short Term Goal: A plan has been developed with personal nutrition goals set during dietitian appointment.;Long Term Goal: Adherence to prescribed nutrition plan.             Nutrition Assessments:  MEDIFICTS Score Key: ?70 Need to make dietary changes  40-70 Heart Healthy Diet ? 40 Therapeutic Level Cholesterol Diet  Flowsheet Row Pulmonary Rehab from 11/27/2022 in [facility] Cardiac and Pulmonary Rehab  Picture Your Plate Total Score on Admission 52      Picture Your Plate Scores: <16 Unhealthy dietary pattern with much room for improvement. 41-50 Dietary pattern unlikely to meet recommendations for good health and room for improvement. 51-60 More healthful dietary pattern, with some room for improvement.  >60 Healthy dietary pattern, although there may be some specific behaviors that could be improved.   Nutrition Goals Re-Evaluation:   Nutrition Goals Discharge (Final Nutrition Goals Re-Evaluation):   Psychosocial: Target Goals: Acknowledge presence or absence of significant depression and/or stress, maximize coping skills, provide positive support system. Participant is able to verbalize types and ability to use techniques and skills needed for reducing stress and depression.   Education: Stress, Anxiety, and Depression - Group verbal and visual presentation to define topics covered.  Reviews how body is impacted by stress, anxiety, and depression.  Also discusses healthy ways to reduce stress and to treat/manage anxiety and depression.  Written material given at graduation.   Education: Sleep Hygiene -Provides group verbal and written  instruction about how sleep can affect your health.  Define sleep hygiene, discuss sleep cycles and impact of sleep habits. Review good sleep hygiene tips.    Initial Review & Psychosocial Screening:  Initial Psych Review & Screening - 11/19/22 1349  Initial Review   Current issues with None Identified      Family Dynamics   Good Support System? 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 He is retiring from an [employer]. He has good family support system. His wife and his faith are big supporters in his life.      Barriers   Psychosocial barriers to participate in program There are no identifiable barriers or psychosocial needs.;The patient should benefit from training in stress management and relaxation.      Screening Interventions   Interventions To provide support and resources with identified psychosocial needs;Provide feedback about the scores to participant;Encouraged to exercise    Expected Outcomes Short Term goal: Utilizing psychosocial counselor, staff and physician to assist with identification of specific Stressors or current issues interfering with healing process. Setting desired goal for each stressor or current issue identified.;Long Term Goal: Stressors or current issues are controlled or eliminated.;Short Term goal: Identification and review with participant of any Quality of Life or Depression concerns found by scoring the questionnaire.;Long Term goal: The participant improves quality of Life and PHQ9 Scores as seen by post scores and/or verbalization of changes             Quality of Life Scores:  Scores of 19 and below usually indicate a poorer quality of life in these areas.  A difference of  2-3 points is a clinically meaningful difference.  A difference of 2-3 points in the total score of the Quality of Life Index has been associated with significant improvement in overall quality of life, self-image,  physical symptoms, and general health in studies assessing change in quality of life.  PHQ-9: Review Flowsheet       11/27/2022 12/06/2020  Depression screen PHQ 2/9  Decreased Interest 1 0  Down, Depressed, Hopeless 1 1  PHQ - 2 Score 2 1  Altered sleeping 1 -  Tired, decreased energy 1 -  Change in appetite 0 -  Feeling bad or failure about yourself  1 -  Trouble concentrating 0 -  Moving slowly or fidgety/restless 0 -  Suicidal thoughts 0 -  PHQ-9 Score 5 -  Difficult doing work/chores Not difficult at all -   Interpretation of Total Score  Total Score Depression Severity:  1-4 = Minimal depression, 5-9 = Mild depression, 10-14 = Moderate depression, 15-19 = Moderately severe depression, 20-27 = Severe depression   Psychosocial Evaluation and Intervention:  Psychosocial Evaluation - 11/19/22 1353       Psychosocial Evaluation & Interventions   Interventions Encouraged to exercise with the program and follow exercise prescription;Relaxation education;Stress management education    Comments 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 He is retiring from an [employer]. He has good family support system. His wife and his faith are big supporters in his life.    Expected Outcomes Short: Start [facility] to help with mood. Long: Maintain a healthy mental state.    Continue Psychosocial Services  Follow up required by staff             Psychosocial Re-Evaluation:   Psychosocial Discharge (Final Psychosocial Re-Evaluation):   Education: Education Goals: Education classes will be provided on a weekly basis, covering required topics. Participant will state understanding/return demonstration of topics presented.  Learning Barriers/Preferences:  Learning Barriers/Preferences - 11/19/22 1348       Learning Barriers/Preferences   Learning Barriers None  Learning Preferences None             General Pulmonary Education  Topics:  Infection Prevention: - Provides verbal and written material to individual with discussion of infection control including proper hand washing and proper equipment cleaning during exercise session. Flowsheet Row Pulmonary Rehab from 11/27/2022 in [facility] Cardiac and Pulmonary Rehab  Date 11/19/22  Educator [facility]  Instruction Review Code 1- Verbalizes Understanding       Falls Prevention: - Provides verbal and written material to individual with discussion of falls prevention and safety. Flowsheet Row Pulmonary Rehab from 11/27/2022 in [facility] Cardiac and Pulmonary Rehab  Date 11/19/22  Educator [facility]  Instruction Review Code 1- Verbalizes Understanding       Chronic Lung Disease Review: - Group verbal instruction with posters, models, PowerPoint presentations and videos,  to review new updates, new respiratory medications, new advancements in procedures and treatments. Providing information on websites and "800" numbers for continued self-education. Includes information about supplement oxygen, available portable oxygen systems, continuous and intermittent flow rates, oxygen safety, concentrators, and Medicare reimbursement for oxygen. Explanation of Pulmonary Drugs, including class, frequency, complications, importance of spacers, rinsing mouth after steroid MDI's, and proper cleaning methods for nebulizers. Review of basic lung anatomy and physiology related to function, structure, and complications of lung disease. Review of risk factors. Discussion about methods for diagnosing sleep apnea and types of masks and machines for OSA. Includes a review of the use of types of environmental controls: home humidity, furnaces, filters, dust mite/pet prevention, HEPA vacuums. Discussion about weather changes, air quality and the benefits of nasal washing. Instruction on Warning signs, infection symptoms, calling MD promptly, preventive modes, and value of vaccinations. Review of effective airway clearance,  coughing and/or vibration techniques. Emphasizing that all should Create an Action Plan. Written material given at graduation. Flowsheet Row Pulmonary Rehab from 11/27/2022 in [facility] Card[facility] and Pulmonary Rehab  Education need identified 11/27/22       AED/CPR: - Group verbal and written instruction with the use of models to demonstrate the basic use of the AED with the basic ABC's of resuscitation.    Anatomy and Cardiac Procedures: - Group verbal and visual presentation and models provide information about basic cardiac anatomy and function. Reviews the testing methods done to diagnose heart disease and the outcomes of the test results. Describes the treatment choices: Medical Management, Angioplasty, or Coronary Bypass Surgery for treating various heart conditions including Myocardial Infarction, Angina, Valve Disease, and Cardiac Arrhythmias.  Written material given at graduation.   Medication Safety: - Group verbal and visual instruction to review commonly prescribed medications for heart and lung disease. Reviews the medication, class of the drug, and side effects. Includes the steps to properly store meds and maintain the prescription regimen.  Written material given at graduation. Flowsheet Row Pulmonary Rehab from 11/27/2022 in [facility] Cardiac and Pulmonary Rehab  Education need identified 11/27/22       Other: -Provides group and verbal instruction on various topics (see comments)   Knowledge Questionnaire Score:  Knowledge Questionnaire Score - 11/27/22 1203       Knowledge Questionnaire Score   Pre Score 12/18              Core Components/Risk Factors/Patient Goals at Admission:  Personal Goals and Risk Factors at Admission - 11/19/22 1348       Core Components/Risk Factors/Patient Goals on Admission    Weight Management Yes;Weight Loss    Intervention Weight Management: Develop a combined nutrition  and exercise program designed to reach desired caloric intake,  while maintaining appropriate intake of nutrient and fiber, sodium and fats, and appropriate energy expenditure required for the weight goal.;Weight Management: Provide education and appropriate resources to help participant work on and attain dietary goals.;Weight Management/Obesity: Establish reasonable short term and long term weight goals.;Obesity: Provide education and appropriate resources to help participant work on and attain dietary goals.    Expected Outcomes Short Term: Continue to assess and modify interventions until short term weight is achieved;Long Term: Adherence to nutrition and physical activity/exercise program aimed toward attainment of established weight goal;Weight Loss: Understanding of general recommendations for a balanced deficit meal plan, which promotes 1-2 lb weight loss per week and includes a negative energy balance of [phone] kcal/d;Understanding recommendations for meals to include 15-35% energy as protein, 25-35% energy from fat, 35-60% energy from carbohydrates, less than 200mg  of dietary cholesterol, 20-35 gm of total fiber daily;Understanding of distribution of calorie intake throughout the day with the consumption of 4-5 meals/snacks    Improve shortness of breath with ADL's Yes    Intervention Provide education, individualized exercise plan and daily activity instruction to help decrease symptoms of SOB with activities of daily living.    Expected Outcomes Short Term: Improve cardiorespiratory fitness to achieve a reduction of symptoms when performing ADLs;Long Term: Be able to perform more ADLs without symptoms or delay the onset of symptoms    Diabetes Yes    Intervention Provide education about signs/symptoms and action to take for hypo/hyperglycemia.;Provide education about proper nutrition, including hydration, and aerobic/resistive exercise prescription along with prescribed medications to achieve blood glucose in normal ranges: Fasting glucose 65-99 mg/dL     Expected Outcomes Short Term: Participant verbalizes understanding of the signs/symptoms and immediate care of hyper/hypoglycemia, proper foot care and importance of medication, aerobic/resistive exercise and nutrition plan for blood glucose control.;Long Term: Attainment of HbA1C < 7%.    Heart Failure Yes    Intervention Provide a combined exercise and nutrition program that is supplemented with education, support and counseling about heart failure. Directed toward relieving symptoms such as shortness of breath, decreased exercise tolerance, and extremity edema.    Expected Outcomes Improve functional capacity of life;Short term: Attendance in program 2-3 days a week with increased exercise capacity. Reported lower sodium intake. Reported increased fruit and vegetable intake. Reports medication compliance.;Short term: Daily weights obtained and reported for increase. Utilizing diuretic protocols set by physician.;Long term: Adoption of self-care skills and reduction of barriers for early signs and symptoms recognition and intervention leading to self-care maintenance.    Hypertension Yes    Intervention Provide education on lifestyle modifcations including regular physical activity/exercise, weight management, moderate sodium restriction and increased consumption of fresh fruit, vegetables, and low fat dairy, alcohol moderation, and smoking cessation.;Monitor prescription use compliance.    Expected Outcomes Short Term: Continued assessment and intervention until BP is < 140/52mm HG in hypertensive participants. < 130/106mm HG in hypertensive participants with diabetes, heart failure or chronic kidney disease.;Long Term: Maintenance of blood pressure at goal levels.    Lipids Yes    Intervention Provide education and support for participant on nutrition & aerobic/resistive exercise along with prescribed medications to achieve LDL 70mg , HDL >40mg .    Expected Outcomes Short Term: Participant states  understanding of desired cholesterol values and is compliant with medications prescribed. Participant is following exercise prescription and nutrition guidelines.;Long Term: Cholesterol controlled with medications as prescribed, with individualized exercise RX and with personalized nutrition plan. Value  goals: LDL < 70mg , HDL > 40 mg.             Education:Diabetes - Individual verbal and written instruction to review signs/symptoms of diabetes, desired ranges of glucose level fasting, after meals and with exercise. Acknowledge that pre and post exercise glucose checks will be done for 3 sessions at entry of program. Flowsheet Row Pulmonary Rehab from 11/27/2022 in [facility] Cardiac and Pulmonary Rehab  Date 11/19/22  Educator [facility]  Instruction Review Code 1- Verbalizes Understanding       Know Your Numbers and Heart Failure: - Group verbal and visual instruction to discuss disease risk factors for cardiac and pulmonary disease and treatment options.  Reviews associated critical values for Overweight/Obesity, Hypertension, Cholesterol, and Diabetes.  Discusses basics of heart failure: signs/symptoms and treatments.  Introduces Heart Failure Zone chart for action plan for heart failure.  Written material given at graduation. Flowsheet Row Pulmonary Rehab from 11/27/2022 in [facility] Cardiac and Pulmonary Rehab  Education need identified 11/27/22       Core Components/Risk Factors/Patient Goals Review:    Core Components/Risk Factors/Patient Goals at Discharge (Final Review):    ITP Comments:  ITP Comments     Row Name 11/19/22 1347 11/27/22 1202         ITP Comments Virtual Visit completed. Patient informed on EP and RD appointment and 6 Minute walk test. Patient also informed of patient health questionnaires on My Chart. Patient Verbalizes understanding. Visit diagnosis can be found in [facility] 08/06/2022. Completed and gym orientation. Initial ITP created and sent for review to Dr. Vida Rigger, Medical Director.               Comments: Initial ITP

## 2022-11-27 NOTE — Patient Instructions (Signed)
Patient Instructions  Patient Details  Name: Glen Jenkins MRN: [id number] Date of Birth: 1941-03-18 Referring Provider:  Vida Rigger, MD  Below are your personal goals for exercise, nutrition, and risk factors. Our goal is to help you stay on track towards obtaining and maintaining these goals. We will be discussing your progress on these goals with you throughout the program.  Initial Exercise Prescription:  Initial Exercise Prescription - 11/27/22 1300       Date of Initial Exercise RX and Referring Provider   Date 11/27/22    Referring Provider Dr. Vida Rigger      Oxygen   Maintain Oxygen Saturation 88% or higher      Treadmill   MPH 1.4    Grade 0    Minutes 15    METs 2.07      NuStep   Level 2    SPM 80    Minutes 15    METs 1.26      Biostep-RELP   Level 1    SPM 50    Minutes 15    METs 1.26      Prescription Details   Frequency (times per week) 2    Duration Progress to 30 minutes of continuous aerobic without signs/symptoms of physical distress      Intensity   THRR 40-80% of Max Heartrate 86-120    Ratings of Perceived Exertion 11-13    Perceived Dyspnea 0-4      Progression   Progression Continue to progress workloads to maintain intensity without signs/symptoms of physical distress.      Resistance Training   Training Prescription Yes    Weight 4 lb    Reps 10-15             Exercise Goals: Frequency: Be able to perform aerobic exercise two to three times per week in program working toward 2-5 days per week of home exercise.  Intensity: Work with a perceived exertion of 11 (fairly light) - 15 (hard) while following your exercise prescription.  We will make changes to your prescription with you as you progress through the program.   Duration: Be able to do 30 to 45 minutes of continuous aerobic exercise in addition to a 5 minute warm-up and a 5 minute cool-down routine.   Nutrition Goals: Your personal nutrition goals will be  established when you do your nutrition analysis with the dietician.  The following are general nutrition guidelines to follow: Cholesterol < 200mg /day Sodium < 1500mg /day Fiber: Men over 50 yrs - 30 grams per day  Personal Goals:  Personal Goals and Risk Factors at Admission - 11/19/22 1348       Core Components/Risk Factors/Patient Goals on Admission    Weight Management Yes;Weight Loss    Intervention Weight Management: Develop a combined nutrition and exercise program designed to reach desired caloric intake, while maintaining appropriate intake of nutrient and fiber, sodium and fats, and appropriate energy expenditure required for the weight goal.;Weight Management: Provide education and appropriate resources to help participant work on and attain dietary goals.;Weight Management/Obesity: Establish reasonable short term and long term weight goals.;Obesity: Provide education and appropriate resources to help participant work on and attain dietary goals.    Expected Outcomes Short Term: Continue to assess and modify interventions until short term weight is achieved;Long Term: Adherence to nutrition and physical activity/exercise program aimed toward attainment of established weight goal;Weight Loss: Understanding of general recommendations for a balanced deficit meal plan, which promotes 1-2 lb weight loss  per week and includes a negative energy balance of [phone] kcal/d;Understanding recommendations for meals to include 15-35% energy as protein, 25-35% energy from fat, 35-60% energy from carbohydrates, less than 200mg  of dietary cholesterol, 20-35 gm of total fiber daily;Understanding of distribution of calorie intake throughout the day with the consumption of 4-5 meals/snacks    Improve shortness of breath with ADL's Yes    Intervention Provide education, individualized exercise plan and daily activity instruction to help decrease symptoms of SOB with activities of daily living.    Expected  Outcomes Short Term: Improve cardiorespiratory fitness to achieve a reduction of symptoms when performing ADLs;Long Term: Be able to perform more ADLs without symptoms or delay the onset of symptoms    Diabetes Yes    Intervention Provide education about signs/symptoms and action to take for hypo/hyperglycemia.;Provide education about proper nutrition, including hydration, and aerobic/resistive exercise prescription along with prescribed medications to achieve blood glucose in normal ranges: Fasting glucose 65-99 mg/dL    Expected Outcomes Short Term: Participant verbalizes understanding of the signs/symptoms and immediate care of hyper/hypoglycemia, proper foot care and importance of medication, aerobic/resistive exercise and nutrition plan for blood glucose control.;Long Term: Attainment of HbA1C < 7%.    Heart Failure Yes    Intervention Provide a combined exercise and nutrition program that is supplemented with education, support and counseling about heart failure. Directed toward relieving symptoms such as shortness of breath, decreased exercise tolerance, and extremity edema.    Expected Outcomes Improve functional capacity of life;Short term: Attendance in program 2-3 days a week with increased exercise capacity. Reported lower sodium intake. Reported increased fruit and vegetable intake. Reports medication compliance.;Short term: Daily weights obtained and reported for increase. Utilizing diuretic protocols set by physician.;Long term: Adoption of self-care skills and reduction of barriers for early signs and symptoms recognition and intervention leading to self-care maintenance.    Hypertension Yes    Intervention Provide education on lifestyle modifcations including regular physical activity/exercise, weight management, moderate sodium restriction and increased consumption of fresh fruit, vegetables, and low fat dairy, alcohol moderation, and smoking cessation.;Monitor prescription use compliance.     Expected Outcomes Short Term: Continued assessment and intervention until BP is < 140/29mm HG in hypertensive participants. < 130/68mm HG in hypertensive participants with diabetes, heart failure or chronic kidney disease.;Long Term: Maintenance of blood pressure at goal levels.    Lipids Yes    Intervention Provide education and support for participant on nutrition & aerobic/resistive exercise along with prescribed medications to achieve LDL 70mg , HDL >40mg .    Expected Outcomes Short Term: Participant states understanding of desired cholesterol values and is compliant with medications prescribed. Participant is following exercise prescription and nutrition guidelines.;Long Term: Cholesterol controlled with medications as prescribed, with individualized exercise RX and with personalized nutrition plan. Value goals: LDL < 70mg , HDL > 40 mg.            Exercise Goals and Review:  Exercise Goals     Row Name 11/27/22 1247             Exercise Goals   Increase Physical Activity Yes       Intervention Provide advice, education, support and counseling about physical activity/exercise needs.;Develop an individualized exercise prescription for aerobic and resistive training based on initial evaluation findings, risk stratification, comorbidities and participant's personal goals.       Expected Outcomes Short Term: Attend rehab on a regular basis to increase amount of physical activity.;Long Term: Add in home exercise to  make exercise part of routine and to increase amount of physical activity.;Long Term: Exercising regularly at least 3-5 days a week.       Increase Strength and Stamina Yes       Intervention Provide advice, education, support and counseling about physical activity/exercise needs.;Develop an individualized exercise prescription for aerobic and resistive training based on initial evaluation findings, risk stratification, comorbidities and participant's personal goals.        Expected Outcomes Short Term: Increase workloads from initial exercise prescription for resistance, speed, and METs.;Long Term: Improve cardiorespiratory fitness, muscular endurance and strength as measured by increased METs and functional capacity ( );Short Term: Perform resistance training exercises routinely during rehab and add in resistance training at home       Able to understand and use rate of perceived exertion (RPE) scale Yes       Intervention Provide education and explanation on how to use RPE scale       Expected Outcomes Short Term: Able to use RPE daily in rehab to express subjective intensity level;Long Term:  Able to use RPE to guide intensity level when exercising independently       Able to understand and use Dyspnea scale Yes       Intervention Provide education and explanation on how to use Dyspnea scale       Expected Outcomes Short Term: Able to use Dyspnea scale daily in rehab to express subjective sense of shortness of breath during exertion;Long Term: Able to use Dyspnea scale to guide intensity level when exercising independently       Knowledge and understanding of Target Heart Rate Range (THRR) Yes       Intervention Provide education and explanation of THRR including how the numbers were predicted and where they are located for reference       Expected Outcomes Short Term: Able to state/look up THRR;Long Term: Able to use THRR to govern intensity when exercising independently;Short Term: Able to use daily as guideline for intensity in rehab       Able to check pulse independently Yes       Intervention Provide education and demonstration on how to check pulse in carotid and radial arteries.;Review the importance of being able to check your own pulse for safety during independent exercise       Expected Outcomes Short Term: Able to explain why pulse checking is important during independent exercise;Long Term: Able to check pulse independently and accurately        Understanding of Exercise Prescription Yes       Intervention Provide education, explanation, and written materials on patient's individual exercise prescription       Expected Outcomes Short Term: Able to explain program exercise prescription;Long Term: Able to explain home exercise prescription to exercise independently

## 2022-12-03 ENCOUNTER — Encounter: Payer: Medicare Other | Attending: Pulmonary Disease | Admitting: *Deleted

## 2022-12-03 DIAGNOSIS — J449 Chronic obstructive pulmonary disease, unspecified: Secondary | ICD-10-CM | POA: Insufficient documentation

## 2022-12-03 LAB — GLUCOSE, CAPILLARY
Glucose-Capillary: 207 mg/dL — ABNORMAL HIGH (ref 70–99)
Glucose-Capillary: 159 mg/dL — ABNORMAL HIGH (ref 70–99)

## 2022-12-03 NOTE — Progress Notes (Signed)
Daily Session Note  Patient Details  Name: Glen Jenkins MRN: [id number] Date of Birth: 1940-09-22 Referring Provider:   Flowsheet Row Pulmonary Rehab from 11/27/2022 in [facility] Cardiac and Pulmonary Rehab  Referring Provider Dr. Vida Rigger       Encounter Date: 12/03/2022  Check In:  Session Check In - 12/03/22 0927       Check-In   Supervising physician immediately available to respond to emergencies See telemetry face sheet for immediately available ER MD    Location [facility]-Cardiac & Pulmonary Rehab    Staff Present Darcel Bayley, RN,BC,MSN;Joseph Shelbie Proctor, RN, [place]    Virtual Visit No    Medication changes reported     No    Fall or balance concerns reported    No    Warm-up and Cool-down Performed on first and last piece of equipment    Resistance Training Performed Yes    VAD Patient? No    PAD/SET Patient? No      Pain Assessment   Currently in Pain? No/denies                Social History   Tobacco Use  Smoking Status Former   Packs/day: 3.00   Years: 42.00   Additional pack years: 0.00   Total pack years: 126.00   Types: Cigarettes   Quit date: 1999   Years since quitting: 25.5  Smokeless Tobacco Never    Goals Met:  Independence with exercise equipment Exercise tolerated well No report of concerns or symptoms today Strength training completed today  Goals Unmet:  Not Applicable  Comments: First full day of exercise!  Patient was oriented to gym and equipment including functions, settings, policies, and procedures.  Patient's individual exercise prescription and treatment plan were reviewed.  All starting workloads were established based on the results of the 6 minute walk test done at initial orientation visit.  The plan for exercise progression was also introduced and progression will be customized based on patient's performance and goals.    Dr. Bethann Punches is Medical Director for [facility] Cardiac Rehabilitation.   Dr. Vida Rigger is Medical Director for [facility] Pulmonary Rehabilitation.

## 2022-12-05 ENCOUNTER — Encounter: Payer: Medicare Other | Admitting: *Deleted

## 2022-12-05 DIAGNOSIS — J449 Chronic obstructive pulmonary disease, unspecified: Secondary | ICD-10-CM | POA: Diagnosis not present

## 2022-12-05 LAB — GLUCOSE, CAPILLARY
Glucose-Capillary: 171 mg/dL — ABNORMAL HIGH (ref 70–99)
Glucose-Capillary: 190 mg/dL — ABNORMAL HIGH (ref 70–99)

## 2022-12-05 NOTE — Progress Notes (Signed)
Daily Session Note  Patient Details  Name: Glen Jenkins MRN: [id number] Date of Birth: 14-Sep-1940 Referring Provider:   Flowsheet Row Pulmonary Rehab from 11/27/2022 in [facility]  Referring Provider Dr. Vida Rigger       Encounter Date: 12/05/2022  Check In:  Session Check In - 12/05/22 1201       Check-In   Supervising physician immediately available to respond to emergencies See telemetry face sheet for immediately available ER MD    Location [facility]-Cardiac & Pulmonary Rehab    Staff Present Cora Collum, RN, BSN, CCRP;Noah Tickle, BS, Exercise Physiologist;Other   [place] [facility]   Virtual Visit No    Medication changes reported     No    Fall or balance concerns reported    No    Warm-up and Cool-down Performed on first and last piece of equipment    Resistance Training Performed Yes    VAD Patient? No    PAD/SET Patient? No      Pain Assessment   Currently in Pain? No/denies                Social History   Tobacco Use  Smoking Status Former   Packs/day: 3.00   Years: 42.00   Additional pack years: 0.00   Total pack years: 126.00   Types: Cigarettes   Quit date: 1999   Years since quitting: 25.5  Smokeless Tobacco Never    Goals Met:  Proper associated with RPD/PD & O2 Sat Independence with exercise equipment Exercise tolerated well No report of concerns or symptoms today  Goals Unmet:  Not Applicable  Comments: Pt able to follow exercise prescription today without complaint.  Will continue to monitor for progression.    Dr. Bethann Punches is Medical Director for [facility] Cardiac Rehabilitation.  Dr. Vida Rigger is Medical Director for [facility] Pulmonary Rehabilitation.

## 2022-12-09 ENCOUNTER — Encounter: Payer: Self-pay | Admitting: *Deleted

## 2022-12-09 DIAGNOSIS — J449 Chronic obstructive pulmonary disease, unspecified: Secondary | ICD-10-CM

## 2022-12-09 NOTE — Progress Notes (Signed)
Pulmonary Individual Treatment Plan  Patient Details  Name: Glen Jenkins MRN: [id number] Date of Birth: 1940-11-03 Referring Provider:   Flowsheet Row Pulmonary Rehab from 11/27/2022 in [facility] Cardiac and Pulmonary Rehab  Referring Provider Dr. Vida Rigger       Initial Encounter Date:  Flowsheet Row Pulmonary Rehab from 11/27/2022 in [facility] Cardiac and Pulmonary Rehab  Date 11/27/22       Visit Diagnosis: Chronic obstructive pulmonary disease, unspecified COPD type (HCC)  Patient's Home Medications on Admission:  Current Outpatient Medications:    acetaminophen (TYLENOL) 500 MG tablet, Take by mouth., Disp: , Rfl:    allopurinol (ZYLOPRIM) 100 MG tablet, Take by mouth., Disp: , Rfl:    amiodarone (PACERONE) 200 MG tablet, Take 0.5 tablets by mouth daily. (Patient not taking: Reported on 11/19/2022), Disp: , Rfl:    amiodarone (PACERONE) 200 MG tablet, Take by mouth., Disp: , Rfl:    apixaban (ELIQUIS) 5 MG TABS tablet, Take by mouth., Disp: , Rfl:    Ascorbic Acid (VITAMIN C) 1000 MG tablet, Take 1,000 mg by mouth daily. (Patient not taking: Reported on 11/19/2022), Disp: , Rfl:    b complex vitamins capsule, Take 1 capsule by mouth daily. (Patient not taking: Reported on 12/06/2020), Disp: , Rfl:    budesonide (PULMICORT) 0.5 MG/2ML nebulizer solution, Inhale into the lungs., Disp: , Rfl:    Cholecalciferol (VITAMIN D-3) 125 MCG (5000 UT) TABS, Take by mouth daily. (Patient not taking: Reported on 12/06/2020), Disp: , Rfl:    colchicine 0.6 MG tablet, , Disp: , Rfl:    CONTOUR NEXT TEST test strip, once daily Use as instructed. (Patient not taking: Reported on 11/19/2022), Disp: , Rfl:    docusate calcium (SURFAK) 240 MG capsule, Take 240 mg by mouth daily., Disp: , Rfl:    docusate calcium (SURFAK) 240 MG capsule, Take by mouth. (Patient not taking: Reported on 11/19/2022), Disp: , Rfl:    ELIQUIS 5 MG TABS tablet, Take 5 mg by mouth 2 (two) times daily., Disp: , Rfl:    furosemide  (LASIX) 20 MG tablet, Take 20 mg by mouth. (Patient not taking: Reported on 11/19/2022), Disp: , Rfl:    furosemide (LASIX) 40 MG tablet, Take 1 tablet by mouth 2 (two) times daily., Disp: , Rfl:    glipiZIDE (GLUCOTROL XL) 10 MG 24 hr tablet, Take 10 mg by mouth daily with breakfast. (Patient not taking: Reported on 11/19/2022), Disp: , Rfl:    glipiZIDE (GLUCOTROL XL) 10 MG 24 hr tablet, Take 1 tablet by mouth daily., Disp: , Rfl:    ipratropium-albuterol (DUONEB) 0.5-2.5 (3) MG/3ML SOLN, Inhale into the lungs., Disp: , Rfl:    isosorbide mononitrate (IMDUR) 30 MG 24 hr tablet, Take 30 mg by mouth daily., Disp: , Rfl:    lisinopril (ZESTRIL) 40 MG tablet, Take 1 tablet by mouth daily. (Patient not taking: Reported on 11/19/2022), Disp: , Rfl:    lisinopril (ZESTRIL) 40 MG tablet, Take by mouth., Disp: , Rfl:    metFORMIN (GLUCOPHAGE) 1000 MG tablet, Take 1,000 mg by mouth 2 (two) times daily with a meal. (Patient not taking: Reported on 05/05/2022), Disp: , Rfl:    Microlet Lancets MISC, once daily Use as instructed., Disp: , Rfl:    Multiple Vitamin (ANTIOXIDANT A/C/E PO), Take 1 tablet by mouth daily. (Patient not taking: Reported on 12/06/2020), Disp: , Rfl:    Multiple Vitamins-Minerals (ICAPS AREDS 2 PO), Take by mouth. (Patient not taking: Reported on 11/19/2022), Disp: ,  Rfl:    simvastatin (ZOCOR) 40 MG tablet, Take 40 mg by mouth daily at 6 PM. (Patient not taking: Reported on 11/19/2022), Disp: , Rfl:    simvastatin (ZOCOR) 40 MG tablet, Take 1 tablet by mouth at bedtime., Disp: , Rfl:    TRELEGY ELLIPTA 100-62.5-25 MCG/ACT AEPB, Inhale into the lungs., Disp: , Rfl:   Past Medical History: Past Medical History:  Diagnosis Date   AAA (abdominal aortic aneurysm) (HCC)    Anginal pain (HCC)    Arthritis    lower back   Cancer (HCC)    Lung Cancer; Squamous Cell Carcinoma   Cardiomyopathy (HCC)    Coronary artery disease    CTS (carpal tunnel syndrome)    right - numbness thumb and 1st  and 2nd fingers   Diabetes mellitus without complication (HCC)    Dysrhythmia    Atrial Fibrillation   GERD (gastroesophageal reflux disease)    Hypercholesterolemia    Hypertension    Nephropathy, diabetic (HCC)    Onychomycosis    Renal insufficiency    Sleep apnea    Varicose veins    Wears dentures    partial upper   Wears hearing aid in both ears     Tobacco Use: Social History   Tobacco Use  Smoking Status Former   Packs/day: 3.00   Years: 42.00   Additional pack years: 0.00   Total pack years: 126.00   Types: Cigarettes   Quit date: 1999   Years since quitting: 25.5  Smokeless Tobacco Never    Labs: Review Flowsheet        No data to display           Pulmonary Assessment Scores:  Pulmonary Assessment Scores     Row Name 11/27/22 1204         ADL UCSD   ADL Phase Entry     SOB Score total 26     Rest 0     Walk 2     Stairs 3     Bath 0     Dress 1     Shop 1       CAT Score   CAT Score 18       mMRC Score   mMRC Score 2              UCSD: Self-administered rating of dyspnea associated with activities of daily living (ADLs) 6-point scale (0 = "not at all" to 5 = "maximal or unable to do because of breathlessness")  Scoring Scores range from 0 to 120.  Minimally important difference is 5 units  CAT: CAT can identify the health impairment of COPD patients and is better correlated with disease progression.  CAT has a scoring range of zero to 40. The CAT score is classified into four groups of low (less than 10), medium (10 - 20), high (21-30) and very high (31-40) based on the impact level of disease on health status. A CAT score over 10 suggests significant symptoms.  A worsening CAT score could be explained by an exacerbation, poor medication adherence, poor inhaler technique, or progression of COPD or comorbid conditions.  CAT MCID is 2 points  mMRC: mMRC (Modified Medical Research Council) Dyspnea Scale is used to assess the  degree of baseline functional disability in patients of respiratory disease due to dyspnea. No minimal important difference is established. A decrease in score of 1 point or greater is considered a positive change.   Pulmonary Function Assessment:  Pulmonary Function Assessment - 11/19/22 1348       Breath   Shortness of Breath Yes;Limiting activity             Exercise Target Goals: Exercise Program Goal: Individual exercise prescription set using results from initial 6 min walk test and THRR while considering  patient's activity barriers and safety.   Exercise Prescription Goal: Initial exercise prescription builds to 30-45 minutes a day of aerobic activity, 2-3 days per week.  Home exercise guidelines will be given to patient during program as part of exercise prescription that the participant will acknowledge.  Education: Aerobic Exercise: - Group verbal and visual presentation on the components of exercise prescription. Introduces F.I.T.T principle from ACSM for exercise prescriptions.  Reviews F.I.T.T. principles of aerobic exercise including progression. Written material given at graduation.   Education: Resistance Exercise: - Group verbal and visual presentation on the components of exercise prescription. Introduces F.I.T.T principle from ACSM for exercise prescriptions  Reviews F.I.T.T. principles of resistance exercise including progression. Written material given at graduation.    Education: Exercise & Equipment Safety: - Individual verbal instruction and demonstration of equipment use and safety with use of the equipment. Flowsheet Row Pulmonary Rehab from 11/27/2022 in [facility] Cardiac and Pulmonary Rehab  Date 11/19/22  Educator [facility]  Instruction Review Code 1- Verbalizes Understanding       Education: Exercise Physiology & General Exercise Guidelines: - Group verbal and written instruction with models to review the exercise physiology of the cardiovascular system and  associated critical values. Provides general exercise guidelines with specific guidelines to those with heart or lung disease.    Education: Flexibility, Balance, Mind/Body Relaxation: - Group verbal and visual presentation with interactive activity on the components of exercise prescription. Introduces F.I.T.T principle from ACSM for exercise prescriptions. Reviews F.I.T.T. principles of flexibility and balance exercise training including progression. Also discusses the mind body connection.  Reviews various relaxation techniques to help reduce and manage stress (i.e. Deep breathing, progressive muscle relaxation, and visualization). Balance handout provided to take home. Written material given at graduation.   Activity Barriers & Risk Stratification:  Activity Barriers & Cardiac Risk Stratification - 11/27/22 1346       Activity Barriers & Cardiac Risk Stratification   Activity Barriers Neck/Spine Problems;Back Problems             6 Minute Walk:  6 Minute Walk     Row Name 11/27/22 1341         6 Minute Walk   Phase Initial     Distance 900 feet     Walk Time 6 minutes     # of Rest Breaks 0     MPH 1.7     METS 1.26     RPE 13     Perceived Dyspnea  3     VO2 Peak 4.39     Symptoms Yes (comment)     Comments SOB     Resting HR 53 bpm     Resting BP 138/56     Resting Oxygen Saturation  95 %     Exercise Oxygen Saturation  during 6 min walk 91 %     Max Ex. HR 110 bpm     Max Ex. BP 158/62     2 Minute Post BP 134/58       Interval HR   1 Minute HR 68     2 Minute HR 79     3 Minute HR 86  4 Minute HR 110     5 Minute HR 106     6 Minute HR 98     2 Minute Post HR 66     Interval Heart Rate? Yes       Interval Oxygen   Interval Oxygen? Yes     Baseline Oxygen Saturation % 95 %     1 Minute Oxygen Saturation % 95 %     1 Minute Liters of Oxygen 0 L  RA     2 Minute Oxygen Saturation % 95 %     2 Minute Liters of Oxygen 0 L  RA     3 Minute Oxygen  Saturation % 97 %     3 Minute Liters of Oxygen 0 L  RA     4 Minute Oxygen Saturation % 95 %     4 Minute Liters of Oxygen 0 L  RA     5 Minute Oxygen Saturation % 94 %     5 Minute Liters of Oxygen 0 L  RA     6 Minute Oxygen Saturation % 91 %     6 Minute Liters of Oxygen 0 L  RA     2 Minute Post Oxygen Saturation % 98 %     2 Minute Post Liters of Oxygen 0 L  RA             Oxygen Initial Assessment:  Oxygen Initial Assessment - 11/19/22 1347       Home Oxygen   Home Oxygen Device None    Sleep Oxygen Prescription None    Home Exercise Oxygen Prescription None    Home Resting Oxygen Prescription None      Initial 6 min Walk   Oxygen Used None      Program Oxygen Prescription   Program Oxygen Prescription None      Intervention   Short Term Goals To learn and understand importance of monitoring SPO2 with pulse oximeter and demonstrate accurate use of the pulse oximeter.;To learn and demonstrate proper pursed lip breathing techniques or other breathing techniques. ;To learn and understand importance of maintaining oxygen saturations>88%;To learn and demonstrate proper use of respiratory medications;To learn and exhibit compliance with exercise, home and travel O2 prescription    Long  Term Goals Exhibits compliance with exercise, home  and travel O2 prescription;Verbalizes importance of monitoring SPO2 with pulse oximeter and return demonstration;Exhibits proper breathing techniques, such as pursed lip breathing or other method taught during program session;Demonstrates proper use of MDI's;Compliance with respiratory medication;Maintenance of O2 saturations>88%             Oxygen Re-Evaluation:   Oxygen Discharge (Final Oxygen Re-Evaluation):   Initial Exercise Prescription:  Initial Exercise Prescription - 11/27/22 1300       Date of Initial Exercise RX and Referring Provider   Date 11/27/22    Referring Provider Dr. Vida Rigger      Oxygen   Maintain  Oxygen Saturation 88% or higher      Treadmill   MPH 1.4    Grade 0    Minutes 15    METs 2.07      NuStep   Level 2    SPM 80    Minutes 15    METs 1.26      Biostep-RELP   Level 1    SPM 50    Minutes 15    METs 1.26      Prescription Details   Frequency (times  per week) 2    Duration Progress to 30 minutes of continuous aerobic without signs/symptoms of physical distress      Intensity   THRR 40-80% of Max Heartrate 86-120    Ratings of Perceived Exertion 11-13    Perceived Dyspnea 0-4      Progression   Progression Continue to progress workloads to maintain intensity without signs/symptoms of physical distress.      Resistance Training   Training Prescription Yes    Weight 4 lb    Reps 10-15             Perform Capillary Blood Glucose checks as needed.  Exercise Prescription Changes:   Exercise Prescription Changes     Row Name 11/27/22 1300             Response to Exercise   Blood Pressure (Admit) 138/56       Blood Pressure (Exercise) 158/62       Blood Pressure (Exit) 134/58       Heart Rate (Admit) 53 bpm       Heart Rate (Exercise) 110 bpm       Heart Rate (Exit) 66 bpm       Oxygen Saturation (Admit) 95 %       Oxygen Saturation (Exercise) 91 %       Oxygen Saturation (Exit) 98 %       Rating of Perceived Exertion (Exercise) 13       Perceived Dyspnea (Exercise) 3       Symptoms SOB       Comments Results                Exercise Comments:   Exercise Comments     Row Name 12/03/22 0929           Exercise Comments First full day of exercise!  Patient was oriented to gym and equipment including functions, settings, policies, and procedures.  Patient's individual exercise prescription and treatment plan were reviewed.  All starting workloads were established based on the results of the 6 minute walk test done at initial orientation visit.  The plan for exercise progression was also introduced and progression will be  customized based on patient's performance and goals.                Exercise Goals and Review:   Exercise Goals     Row Name 11/27/22 1247             Exercise Goals   Increase Physical Activity Yes       Intervention Provide advice, education, support and counseling about physical activity/exercise needs.;Develop an individualized exercise prescription for aerobic and resistive training based on initial evaluation findings, risk stratification, comorbidities and participant's personal goals.       Expected Outcomes Short Term: Attend rehab on a regular basis to increase amount of physical activity.;Long Term: Add in home exercise to make exercise part of routine and to increase amount of physical activity.;Long Term: Exercising regularly at least 3-5 days a week.       Increase Strength and Stamina Yes       Intervention Provide advice, education, support and counseling about physical activity/exercise needs.;Develop an individualized exercise prescription for aerobic and resistive training based on initial evaluation findings, risk stratification, comorbidities and participant's personal goals.       Expected Outcomes Short Term: Increase workloads from initial exercise prescription for resistance, speed, and METs.;Long Term: Improve cardiorespiratory fitness, muscular endurance and  strength as measured by increased METs and functional capacity ( );Short Term: Perform resistance training exercises routinely during rehab and add in resistance training at home       Able to understand and use rate of perceived exertion (RPE) scale Yes       Intervention Provide education and explanation on how to use RPE scale       Expected Outcomes Short Term: Able to use RPE daily in rehab to express subjective intensity level;Long Term:  Able to use RPE to guide intensity level when exercising independently       Able to understand and use Dyspnea scale Yes       Intervention Provide education and  explanation on how to use Dyspnea scale       Expected Outcomes Short Term: Able to use Dyspnea scale daily in rehab to express subjective sense of shortness of breath during exertion;Long Term: Able to use Dyspnea scale to guide intensity level when exercising independently       Knowledge and understanding of Target Heart Rate Range (THRR) Yes       Intervention Provide education and explanation of THRR including how the numbers were predicted and where they are located for reference       Expected Outcomes Short Term: Able to state/look up THRR;Long Term: Able to use THRR to govern intensity when exercising independently;Short Term: Able to use daily as guideline for intensity in rehab       Able to check pulse independently Yes       Intervention Provide education and demonstration on how to check pulse in carotid and radial arteries.;Review the importance of being able to check your own pulse for safety during independent exercise       Expected Outcomes Short Term: Able to explain why pulse checking is important during independent exercise;Long Term: Able to check pulse independently and accurately       Understanding of Exercise Prescription Yes       Intervention Provide education, explanation, and written materials on patient's individual exercise prescription       Expected Outcomes Short Term: Able to explain program exercise prescription;Long Term: Able to explain home exercise prescription to exercise independently                Exercise Goals Re-Evaluation :  Exercise Goals Re-Evaluation     Row Name 12/03/22 0929             Exercise Goal Re-Evaluation   Exercise Goals Review Able to understand and use rate of perceived exertion (RPE) scale;Able to understand and use Dyspnea scale;Knowledge and understanding of Target Heart Rate Range (THRR);Understanding of Exercise Prescription       Comments Reviewed RPE  and dyspnea scale, THR and program prescription with pt today.   Pt voiced understanding and was given a copy of goals to take home.       Expected Outcomes Short: Use RPE daily to regulate intensity. Long: Follow program prescription in THR.                Discharge Exercise Prescription (Final Exercise Prescription Changes):  Exercise Prescription Changes - 11/27/22 1300       Response to Exercise   Blood Pressure (Admit) 138/56    Blood Pressure (Exercise) 158/62    Blood Pressure (Exit) 134/58    Heart Rate (Admit) 53 bpm    Heart Rate (Exercise) 110 bpm    Heart Rate (Exit) 66 bpm    Oxygen  Saturation (Admit) 95 %    Oxygen Saturation (Exercise) 91 %    Oxygen Saturation (Exit) 98 %    Rating of Perceived Exertion (Exercise) 13    Perceived Dyspnea (Exercise) 3    Symptoms SOB    Comments Results             Nutrition:  Target Goals: Understanding of nutrition guidelines, daily intake of sodium 1500mg , cholesterol 200mg , calories 30% from fat and 7% or less from saturated fats, daily to have 5 or more servings of fruits and vegetables.  Education: All About Nutrition: -Group instruction provided by verbal, written material, interactive activities, discussions, models, and posters to present general guidelines for heart healthy nutrition including fat, fiber, MyPlate, the role of sodium in heart healthy nutrition, utilization of the nutrition label, and utilization of this knowledge for meal planning. Follow up email sent as well. Written material given at graduation. Flowsheet Row Pulmonary Rehab from 11/27/2022 in [facility] Cardiac and Pulmonary Rehab  Education need identified 11/27/22       Biometrics:  Pre Biometrics - 11/27/22 1346       Pre Biometrics   Height 6\' 1"  (1.854 m)    Weight 279 lb 9.6 oz (126.8 kg)    Waist Circumference 50 inches    Hip Circumference 53 inches    Waist to Hip Ratio 0.94 %    BMI (Calculated) 36.9    Single Leg Stand 0 seconds              Nutrition Therapy Plan and Nutrition  Goals:  Nutrition Therapy & Goals - 11/27/22 1131       Nutrition Therapy   Diet Mediterranean    Protein (specify units) 90    Fiber 30 grams    Whole Grain Foods 3 servings    Saturated Fats 15 max. grams    Fruits and Vegetables 5 servings/day    Sodium 1.5 grams      Personal Nutrition Goals   Nutrition Goal Eat a protein at every meal (use protein shake if needed)    Personal Goal #2 Small frequent meals    Personal Goal #3 Try lentils as plant based protein    Comments He reports he doesn't eat breakfast, has a large lunch, small dinner. He says dinner is all veggies. Talked tp him about adding protein. Reviewed primer protein shake, low calorie (he is trying to lose weight) but RD still wants to ensure he has adequate protein intake. He also likes salmon. He is not sure if he has tried lentils, encouraged him to try them. His wife reads labels and watches for sodium and fat content. He eats out a lot, wants to work on eating out less. Reviewed mediterranean diet handout, focusing on eating small frequent meals, protein at every meal.      Intervention Plan   Intervention Prescribe, educate and counsel regarding individualized specific dietary modifications aiming towards targeted core components such as weight, hypertension, lipid management, diabetes, heart failure and other comorbidities.;Nutrition handout(s) given to patient.    Expected Outcomes Short Term Goal: Understand basic principles of dietary content, such as calories, fat, sodium, cholesterol and nutrients.;Short Term Goal: A plan has been developed with personal nutrition goals set during dietitian appointment.;Long Term Goal: Adherence to prescribed nutrition plan.             Nutrition Assessments:  MEDIFICTS Score Key: ?70 Need to make dietary changes  40-70 Heart Healthy Diet ? 40 Therapeutic Level  Cholesterol Diet  Flowsheet Row Pulmonary Rehab from 11/27/2022 in [facility] Cardiac and Pulmonary Rehab  Picture  Your Plate Total Score on Admission 52      Picture Your Plate Scores: <16 Unhealthy dietary pattern with much room for improvement. 41-50 Dietary pattern unlikely to meet recommendations for good health and room for improvement. 51-60 More healthful dietary pattern, with some room for improvement.  >60 Healthy dietary pattern, although there may be some specific behaviors that could be improved.   Nutrition Goals Re-Evaluation:   Nutrition Goals Discharge (Final Nutrition Goals Re-Evaluation):   Psychosocial: Target Goals: Acknowledge presence or absence of significant depression and/or stress, maximize coping skills, provide positive support system. Participant is able to verbalize types and ability to use techniques and skills needed for reducing stress and depression.   Education: Stress, Anxiety, and Depression - Group verbal and visual presentation to define topics covered.  Reviews how body is impacted by stress, anxiety, and depression.  Also discusses healthy ways to reduce stress and to treat/manage anxiety and depression.  Written material given at graduation.   Education: Sleep Hygiene -Provides group verbal and written instruction about how sleep can affect your health.  Define sleep hygiene, discuss sleep cycles and impact of sleep habits. Review good sleep hygiene tips.    Initial Review & Psychosocial Screening:  Initial Psych Review & Screening - 11/19/22 1349       Initial Review   Current issues with None Identified      Family Dynamics   Good Support System? 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 He is retiring from an [employer]. He has good family support system. His wife and his faith are big supporters in his life.      Barriers   Psychosocial barriers to participate in program There are no identifiable barriers or psychosocial needs.;The patient should benefit from training in stress management and  relaxation.      Screening Interventions   Interventions To provide support and resources with identified psychosocial needs;Provide feedback about the scores to participant;Encouraged to exercise    Expected Outcomes Short Term goal: Utilizing psychosocial counselor, staff and physician to assist with identification of specific Stressors or current issues interfering with healing process. Setting desired goal for each stressor or current issue identified.;Long Term Goal: Stressors or current issues are controlled or eliminated.;Short Term goal: Identification and review with participant of any Quality of Life or Depression concerns found by scoring the questionnaire.;Long Term goal: The participant improves quality of Life and PHQ9 Scores as seen by post scores and/or verbalization of changes             Quality of Life Scores:  Scores of 19 and below usually indicate a poorer quality of life in these areas.  A difference of  2-3 points is a clinically meaningful difference.  A difference of 2-3 points in the total score of the Quality of Life Index has been associated with significant improvement in overall quality of life, self-image, physical symptoms, and general health in studies assessing change in quality of life.  PHQ-9: Review Flowsheet       11/27/2022 12/06/2020  Depression screen PHQ 2/9  Decreased Interest 1 0  Down, Depressed, Hopeless 1 1  PHQ - 2 Score 2 1  Altered sleeping 1 -  Tired, decreased energy 1 -  Change in appetite 0 -  Feeling bad or failure about yourself  1 -  Trouble concentrating 0 -  Moving slowly or fidgety/restless 0 -  Suicidal thoughts 0 -  PHQ-9 Score 5 -  Difficult doing work/chores Not difficult at all -   Interpretation of Total Score  Total Score Depression Severity:  1-4 = Minimal depression, 5-9 = Mild depression, 10-14 = Moderate depression, 15-19 = Moderately severe depression, 20-27 = Severe depression   Psychosocial Evaluation and  Intervention:  Psychosocial Evaluation - 11/19/22 1353       Psychosocial Evaluation & Interventions   Interventions Encouraged to exercise with the program and follow exercise prescription;Relaxation education;Stress management education    Comments 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 He is retiring from an [employer]. He has good family support system. His wife and his faith are big supporters in his life.    Expected Outcomes Short: Start [facility] to help with mood. Long: Maintain a healthy mental state.    Continue Psychosocial Services  Follow up required by staff             Psychosocial Re-Evaluation:   Psychosocial Discharge (Final Psychosocial Re-Evaluation):   Education: Education Goals: Education classes will be provided on a weekly basis, covering required topics. Participant will state understanding/return demonstration of topics presented.  Learning Barriers/Preferences:  Learning Barriers/Preferences - 11/19/22 1348       Learning Barriers/Preferences   Learning Barriers None    Learning Preferences None             General Pulmonary Education Topics:  Infection Prevention: - Provides verbal and written material to individual with discussion of infection control including proper hand washing and proper equipment cleaning during exercise session. Flowsheet Row Pulmonary Rehab from 11/27/2022 in [facility] Cardiac and Pulmonary Rehab  Date 11/19/22  Educator [facility]  Instruction Review Code 1- Verbalizes Understanding       Falls Prevention: - Provides verbal and written material to individual with discussion of falls prevention and safety. Flowsheet Row Pulmonary Rehab from 11/27/2022 in [facility] Cardiac and Pulmonary Rehab  Date 11/19/22  Educator [facility]  Instruction Review Code 1- Verbalizes Understanding       Chronic Lung Disease Review: - Group verbal instruction with posters, models, PowerPoint presentations and  videos,  to review new updates, new respiratory medications, new advancements in procedures and treatments. Providing information on websites and "800" numbers for continued self-education. Includes information about supplement oxygen, available portable oxygen systems, continuous and intermittent flow rates, oxygen safety, concentrators, and Medicare reimbursement for oxygen. Explanation of Pulmonary Drugs, including class, frequency, complications, importance of spacers, rinsing mouth after steroid MDI's, and proper cleaning methods for nebulizers. Review of basic lung anatomy and physiology related to function, structure, and complications of lung disease. Review of risk factors. Discussion about methods for diagnosing sleep apnea and types of masks and machines for OSA. Includes a review of the use of types of environmental controls: home humidity, furnaces, filters, dust mite/pet prevention, HEPA vacuums. Discussion about weather changes, air quality and the benefits of nasal washing. Instruction on Warning signs, infection symptoms, calling MD promptly, preventive modes, and value of vaccinations. Review of effective airway clearance, coughing and/or vibration techniques. Emphasizing that all should Create an Action Plan. Written material given at graduation. Flowsheet Row Pulmonary Rehab from 11/27/2022 in [facility] Cardiac and Pulmonary Rehab  Education need identified 11/27/22       AED/CPR: - Group verbal and written instruction with the use of models to demonstrate the basic use of the AED  with the basic ABC's of resuscitation.    Anatomy and Cardiac Procedures: - Group verbal and visual presentation and models provide information about basic cardiac anatomy and function. Reviews the testing methods done to diagnose heart disease and the outcomes of the test results. Describes the treatment choices: Medical Management, Angioplasty, or Coronary Bypass Surgery for treating various heart conditions  including Myocardial Infarction, Angina, Valve Disease, and Cardiac Arrhythmias.  Written material given at graduation.   Medication Safety: - Group verbal and visual instruction to review commonly prescribed medications for heart and lung disease. Reviews the medication, class of the drug, and side effects. Includes the steps to properly store meds and maintain the prescription regimen.  Written material given at graduation. Flowsheet Row Pulmonary Rehab from 11/27/2022 in [facility] Cardiac and Pulmonary Rehab  Education need identified 11/27/22       Other: -Provides group and verbal instruction on various topics (see comments)   Knowledge Questionnaire Score:  Knowledge Questionnaire Score - 11/27/22 1203       Knowledge Questionnaire Score   Pre Score 12/18              Core Components/Risk Factors/Patient Goals at Admission:  Personal Goals and Risk Factors at Admission - 11/19/22 1348       Core Components/Risk Factors/Patient Goals on Admission    Weight Management Yes;Weight Loss    Intervention Weight Management: Develop a combined nutrition and exercise program designed to reach desired caloric intake, while maintaining appropriate intake of nutrient and fiber, sodium and fats, and appropriate energy expenditure required for the weight goal.;Weight Management: Provide education and appropriate resources to help participant work on and attain dietary goals.;Weight Management/Obesity: Establish reasonable short term and long term weight goals.;Obesity: Provide education and appropriate resources to help participant work on and attain dietary goals.    Expected Outcomes Short Term: Continue to assess and modify interventions until short term weight is achieved;Long Term: Adherence to nutrition and physical activity/exercise program aimed toward attainment of established weight goal;Weight Loss: Understanding of general recommendations for a balanced deficit meal plan, which  promotes 1-2 lb weight loss per week and includes a negative energy balance of [phone] kcal/d;Understanding recommendations for meals to include 15-35% energy as protein, 25-35% energy from fat, 35-60% energy from carbohydrates, less than 200mg  of dietary cholesterol, 20-35 gm of total fiber daily;Understanding of distribution of calorie intake throughout the day with the consumption of 4-5 meals/snacks    Improve shortness of breath with ADL's Yes    Intervention Provide education, individualized exercise plan and daily activity instruction to help decrease symptoms of SOB with activities of daily living.    Expected Outcomes Short Term: Improve cardiorespiratory fitness to achieve a reduction of symptoms when performing ADLs;Long Term: Be able to perform more ADLs without symptoms or delay the onset of symptoms    Diabetes Yes    Intervention Provide education about signs/symptoms and action to take for hypo/hyperglycemia.;Provide education about proper nutrition, including hydration, and aerobic/resistive exercise prescription along with prescribed medications to achieve blood glucose in normal ranges: Fasting glucose 65-99 mg/dL    Expected Outcomes Short Term: Participant verbalizes understanding of the signs/symptoms and immediate care of hyper/hypoglycemia, proper foot care and importance of medication, aerobic/resistive exercise and nutrition plan for blood glucose control.;Long Term: Attainment of HbA1C < 7%.    Heart Failure Yes    Intervention Provide a combined exercise and nutrition program that is supplemented with education, support and counseling about heart failure. Directed toward  relieving symptoms such as shortness of breath, decreased exercise tolerance, and extremity edema.    Expected Outcomes Improve functional capacity of life;Short term: Attendance in program 2-3 days a week with increased exercise capacity. Reported lower sodium intake. Reported increased fruit and vegetable  intake. Reports medication compliance.;Short term: Daily weights obtained and reported for increase. Utilizing diuretic protocols set by physician.;Long term: Adoption of self-care skills and reduction of barriers for early signs and symptoms recognition and intervention leading to self-care maintenance.    Hypertension Yes    Intervention Provide education on lifestyle modifcations including regular physical activity/exercise, weight management, moderate sodium restriction and increased consumption of fresh fruit, vegetables, and low fat dairy, alcohol moderation, and smoking cessation.;Monitor prescription use compliance.    Expected Outcomes Short Term: Continued assessment and intervention until BP is < 140/52mm HG in hypertensive participants. < 130/67mm HG in hypertensive participants with diabetes, heart failure or chronic kidney disease.;Long Term: Maintenance of blood pressure at goal levels.    Lipids Yes    Intervention Provide education and support for participant on nutrition & aerobic/resistive exercise along with prescribed medications to achieve LDL 70mg , HDL >40mg .    Expected Outcomes Short Term: Participant states understanding of desired cholesterol values and is compliant with medications prescribed. Participant is following exercise prescription and nutrition guidelines.;Long Term: Cholesterol controlled with medications as prescribed, with individualized exercise RX and with personalized nutrition plan. Value goals: LDL < 70mg , HDL > 40 mg.             Education:Diabetes - Individual verbal and written instruction to review signs/symptoms of diabetes, desired ranges of glucose level fasting, after meals and with exercise. Acknowledge that pre and post exercise glucose checks will be done for 3 sessions at entry of program. Flowsheet Row Pulmonary Rehab from 11/27/2022 in [facility] Cardiac and Pulmonary Rehab  Date 11/19/22  Educator [facility]  Instruction Review Code 1- Verbalizes  Understanding       Know Your Numbers and Heart Failure: - Group verbal and visual instruction to discuss disease risk factors for cardiac and pulmonary disease and treatment options.  Reviews associated critical values for Overweight/Obesity, Hypertension, Cholesterol, and Diabetes.  Discusses basics of heart failure: signs/symptoms and treatments.  Introduces Heart Failure Zone chart for action plan for heart failure.  Written material given at graduation. Flowsheet Row Pulmonary Rehab from 11/27/2022 in [facility] Cardiac and Pulmonary Rehab  Education need identified 11/27/22       Core Components/Risk Factors/Patient Goals Review:    Core Components/Risk Factors/Patient Goals at Discharge (Final Review):    ITP Comments:  ITP Comments     Row Name 11/19/22 1347 11/27/22 1202 12/03/22 0929 12/09/22 1407     ITP Comments Virtual Visit completed. Patient informed on EP and RD appointment and 6 Minute walk test. Patient also informed of patient health questionnaires on My Chart. Patient Verbalizes understanding. Visit diagnosis can be found in [facility] 08/06/2022. Completed and gym orientation. Initial ITP created and sent for review to Dr. Vida Rigger, Medical Director. First full day of exercise!  Patient was oriented to gym and equipment including functions, settings, policies, and procedures.  Patient's individual exercise prescription and treatment plan were reviewed.  All starting workloads were established based on the results of the 6 minute walk test done at initial orientation visit.  The plan for exercise progression was also introduced and progression will be customized based on patient's performance and goals. 30 Day review completed. Medical Director ITP review done, changes  made as directed, and signed approval by Medical Director.   new to program             Comments:

## 2022-12-12 ENCOUNTER — Encounter: Payer: Medicare Other | Admitting: *Deleted

## 2022-12-12 DIAGNOSIS — J449 Chronic obstructive pulmonary disease, unspecified: Secondary | ICD-10-CM | POA: Diagnosis not present

## 2022-12-12 NOTE — Progress Notes (Signed)
Daily Session Note  Patient Details  Name: Glen Jenkins MRN: [id number] Date of Birth: 1941/05/18 Referring Provider:   Flowsheet Row Pulmonary Rehab from 11/27/2022 in [facility] Cardiac and Pulmonary Rehab  Referring Provider Dr. Vida Rigger       Encounter Date: 12/12/2022  Check In:  Session Check In - 12/12/22 0954       Check-In   Supervising physician immediately available to respond to emergencies See telemetry face sheet for immediately available ER MD    Location [facility]-Cardiac & Pulmonary Rehab    Staff Present Cora Collum, RN, BSN, CCRP;Kristen Coble, RN,BC,MSN;Joseph [place], [place]    Virtual Visit No    Medication changes reported     No    Fall or balance concerns reported    No    Warm-up and Cool-down Performed on first and last piece of equipment    Resistance Training Performed Yes    VAD Patient? No    PAD/SET Patient? No      Pain Assessment   Currently in Pain? No/denies                Social History   Tobacco Use  Smoking Status Former   Current packs/day: 0.00   Average packs/day: 3.0 packs/day for 42.0 years (126.0 ttl pk-yrs)   Types: Cigarettes   Start date: 21   Quit date: 1999   Years since quitting: 25.5  Smokeless Tobacco Never    Goals Met:  Proper associated with RPD/PD & O2 Sat Independence with exercise equipment Exercise tolerated well No report of concerns or symptoms today  Goals Unmet:  Not Applicable  Comments: Pt able to follow exercise prescription today without complaint.  Will continue to monitor for progression.    Dr. Bethann Punches is Medical Director for [facility] Cardiac Rehabilitation.  Dr. Vida Rigger is Medical Director for [facility] Pulmonary Rehabilitation.

## 2022-12-17 ENCOUNTER — Encounter: Payer: Medicare Other | Admitting: *Deleted

## 2022-12-17 DIAGNOSIS — J449 Chronic obstructive pulmonary disease, unspecified: Secondary | ICD-10-CM | POA: Diagnosis not present

## 2022-12-17 NOTE — Progress Notes (Signed)
Daily Session Note  Patient Details  Name: Glen Jenkins MRN: [id number] Date of Birth: 01/25/1941 Referring Provider:   Doristine Devoid Pulmonary Rehab from 11/27/2022 in [facility] Cardiac and Pulmonary Rehab  Referring Provider Dr. Vida Rigger       Encounter Date: 12/17/2022  Check In:  Session Check In - 12/17/22 0939       Check-In   Supervising physician immediately available to respond to emergencies See telemetry face sheet for immediately available ER MD    Location [facility]-Cardiac & Pulmonary Rehab    Staff Present Lanny Hurst, RN, ADN;Joseph [place], RCP,RRT,BSRT;Betsy Coder, PhD, RN, CNS, CEN    Virtual Visit No    Medication changes reported     No    Fall or balance concerns reported    No    Warm-up and Cool-down Performed on first and last piece of equipment    Resistance Training Performed Yes    VAD Patient? No    PAD/SET Patient? No      Pain Assessment   Currently in Pain? No/denies                Social History   Tobacco Use  Smoking Status Former   Current packs/day: 0.00   Average packs/day: 3.0 packs/day for 42.0 years (126.0 ttl pk-yrs)   Types: Cigarettes   Start date: 63   Quit date: 1999   Years since quitting: 25.5  Smokeless Tobacco Never    Goals Met:  Independence with exercise equipment Exercise tolerated well No report of concerns or symptoms today Strength training completed today  Goals Unmet:  Not Applicable  Comments: Pt able to follow exercise prescription today without complaint.  Will continue to monitor for progression.    Dr. Bethann Punches is Medical Director for [facility] Cardiac Rehabilitation.  Dr. Vida Rigger is Medical Director for [facility] Pulmonary Rehabilitation.

## 2022-12-24 ENCOUNTER — Encounter: Payer: Medicare Other | Admitting: *Deleted

## 2022-12-24 DIAGNOSIS — J449 Chronic obstructive pulmonary disease, unspecified: Secondary | ICD-10-CM

## 2022-12-24 NOTE — Progress Notes (Signed)
Daily Session Note  Patient Details  Name: Glen Jenkins MRN: [id number] Date of Birth: 06-16-1940 Referring Provider:   Flowsheet Row Pulmonary Rehab from 11/27/2022 in [facility] Cardiac and Pulmonary Rehab  Referring Provider Dr. Vida Rigger       Encounter Date: 12/24/2022  Check In:  Session Check In - 12/24/22 1000       Check-In   Supervising physician immediately available to respond to emergencies See telemetry face sheet for immediately available ER MD    Location [facility]-Cardiac & Pulmonary Rehab    Staff Present Lanny Hurst, RN, [place];Roger Shelter, RN, BSN, CCRP   Rory Percy, MS   Virtual Visit No    Medication changes reported     No    Fall or balance concerns reported    No    Warm-up and Cool-down Performed on first and last piece of equipment    Resistance Training Performed Yes    VAD Patient? No    PAD/SET Patient? No      Pain Assessment   Currently in Pain? No/denies                Social History   Tobacco Use  Smoking Status Former   Current packs/day: 0.00   Average packs/day: 3.0 packs/day for 42.0 years (126.0 ttl pk-yrs)   Types: Cigarettes   Start date: 67   Quit date: 1999   Years since quitting: 25.5  Smokeless Tobacco Never    Goals Met:  Independence with exercise equipment Exercise tolerated well No report of concerns or symptoms today Strength training completed today  Goals Unmet:  Not Applicable  Comments: Pt able to follow exercise prescription today without complaint.  Will continue to monitor for progression.    Dr. Bethann Punches is Medical Director for [facility] Cardiac Rehabilitation.  Dr. Vida Rigger is Medical Director for [facility] Pulmonary Rehabilitation.

## 2022-12-26 ENCOUNTER — Encounter: Payer: Medicare Other | Admitting: *Deleted

## 2022-12-26 DIAGNOSIS — J449 Chronic obstructive pulmonary disease, unspecified: Secondary | ICD-10-CM

## 2022-12-26 NOTE — Progress Notes (Signed)
Daily Session Note  Patient Details  Name: Glen Jenkins MRN: [id number] Date of Birth: 01-20-1941 Referring Provider:   Flowsheet Row Pulmonary Rehab from 11/27/2022 in [facility] Cardiac and Pulmonary Rehab  Referring Provider Dr. Vida Rigger       Encounter Date: 12/26/2022  Check In:      Social History   Tobacco Use  Smoking Status Former   Current packs/day: 0.00   Average packs/day: 3.0 packs/day for 42.0 years (126.0 ttl pk-yrs)   Types: Cigarettes   Start date: 1   Quit date: 1999   Years since quitting: 25.5  Smokeless Tobacco Never    Goals Met:  Independence with exercise equipment Exercise tolerated well No report of concerns or symptoms today  Goals Unmet:  Not Applicable  Comments: Pt able to follow exercise prescription today without complaint.  Will continue to monitor for progression.    Dr. Bethann Punches is Medical Director for [facility] Cardiac Rehabilitation.  Dr. Vida Rigger is Medical Director for [facility] Pulmonary Rehabilitation.

## 2022-12-31 ENCOUNTER — Encounter: Payer: Medicare Other | Admitting: *Deleted

## 2022-12-31 DIAGNOSIS — J449 Chronic obstructive pulmonary disease, unspecified: Secondary | ICD-10-CM

## 2022-12-31 NOTE — Progress Notes (Signed)
Daily Session Note  Patient Details  Name: Glen Jenkins MRN: [id number] Date of Birth: Feb 18, 1941 Referring Provider:   Flowsheet Row Pulmonary Rehab from 11/27/2022 in [facility] Cardiac and Pulmonary Rehab  Referring Provider Dr. Vida Rigger       Encounter Date: 12/31/2022  Check In:  Session Check In - 12/31/22 0926       Check-In   Supervising physician immediately available to respond to emergencies See telemetry face sheet for immediately available ER MD    Location [facility]-Cardiac & Pulmonary Rehab    Staff Present Lanny Hurst, RN, Bobetta Lime, RCP,RRT,BSRT;Other    Virtual Visit No    Medication changes reported     No    Fall or balance concerns reported    No    Warm-up and Cool-down Performed on first and last piece of equipment    Resistance Training Performed Yes    VAD Patient? No    PAD/SET Patient? No      Pain Assessment   Currently in Pain? No/denies                Social History   Tobacco Use  Smoking Status Former   Current packs/day: 0.00   Average packs/day: 3.0 packs/day for 42.0 years (126.0 ttl pk-yrs)   Types: Cigarettes   Start date: 71   Quit date: 1999   Years since quitting: 25.5  Smokeless Tobacco Never    Goals Met:  Independence with exercise equipment Exercise tolerated well No report of concerns or symptoms today Strength training completed today  Goals Unmet:  Not Applicable  Comments: Pt able to follow exercise prescription today without complaint.  Will continue to monitor for progression.    Dr. Bethann Punches is Medical Director for [facility] Cardiac Rehabilitation.  Dr. Vida Rigger is Medical Director for [facility] Pulmonary Rehabilitation.

## 2023-01-02 ENCOUNTER — Encounter: Payer: Medicare Other | Attending: Pulmonary Disease | Admitting: *Deleted

## 2023-01-02 ENCOUNTER — Other Ambulatory Visit: Payer: Self-pay | Admitting: Pulmonary Disease

## 2023-01-02 DIAGNOSIS — J449 Chronic obstructive pulmonary disease, unspecified: Secondary | ICD-10-CM | POA: Insufficient documentation

## 2023-01-02 DIAGNOSIS — R918 Other nonspecific abnormal finding of lung field: Secondary | ICD-10-CM

## 2023-01-02 NOTE — Progress Notes (Signed)
Daily Session Note  Patient Details  Name: Glen Jenkins MRN: [id number] Date of Birth: [birth year] Referring Provider:   Flowsheet Row Pulmonary Rehab from 11/27/2022 in [facility] Cardiac and Pulmonary Rehab  Referring Provider Dr. Vida Rigger       Encounter Date: 01/02/2023  Check In:  Session Check In - 01/02/23 1017       Check-In   Supervising physician immediately available to respond to emergencies See telemetry face sheet for immediately available ER MD    Location [facility]-Cardiac & Pulmonary Rehab    Staff Present Cora Collum, RN, BSN, CCRP;Noah Tickle, BS, Exercise Physiologist;Joseph [place], [place]    Virtual Visit No    Medication changes reported     No    Fall or balance concerns reported    No    Warm-up and Cool-down Performed on first and last piece of equipment    Resistance Training Performed Yes    VAD Patient? No    PAD/SET Patient? No      Pain Assessment   Currently in Pain? No/denies                Social History   Tobacco Use  Smoking Status Former   Current packs/day: 0.00   Average packs/day: 3.0 packs/day for 42.0 years (126.0 ttl pk-yrs)   Types: Cigarettes   Start date: 47   Quit date: 1999   Years since quitting: 25.6  Smokeless Tobacco Never    Goals Met:  Proper associated with RPD/PD & O2 Sat Independence with exercise equipment Exercise tolerated well No report of concerns or symptoms today  Goals Unmet:  Not Applicable  Comments: Pt able to follow exercise prescription today without complaint.  Will continue to monitor for progression.    Dr. Bethann Punches is Medical Director for [facility] Cardiac Rehabilitation.  Dr. Vida Rigger is Medical Director for [facility] Pulmonary Rehabilitation.

## 2023-01-07 ENCOUNTER — Encounter: Payer: Self-pay | Admitting: *Deleted

## 2023-01-07 ENCOUNTER — Encounter: Payer: Medicare Other | Admitting: *Deleted

## 2023-01-07 DIAGNOSIS — J449 Chronic obstructive pulmonary disease, unspecified: Secondary | ICD-10-CM

## 2023-01-07 NOTE — Progress Notes (Signed)
Pulmonary Individual Treatment Plan  Patient Details  Name: Glen Jenkins MRN: [id number] Date of Birth: 1940/08/28 Referring Provider:   Flowsheet Row Pulmonary Rehab from 11/27/2022 in [facility] Cardiac and Pulmonary Rehab  Referring Provider Dr. Vida Rigger       Initial Encounter Date:  Flowsheet Row Pulmonary Rehab from 11/27/2022 in [facility] Cardiac and Pulmonary Rehab  Date 11/27/22       Visit Diagnosis: Chronic obstructive pulmonary disease, unspecified COPD type (HCC)  Patient's Home Medications on Admission:  Current Outpatient Medications:    acetaminophen (TYLENOL) 500 MG tablet, Take by mouth., Disp: , Rfl:    allopurinol (ZYLOPRIM) 100 MG tablet, Take by mouth., Disp: , Rfl:    amiodarone (PACERONE) 200 MG tablet, Take 0.5 tablets by mouth daily. (Patient not taking: Reported on 11/19/2022), Disp: , Rfl:    amiodarone (PACERONE) 200 MG tablet, Take by mouth., Disp: , Rfl:    apixaban (ELIQUIS) 5 MG TABS tablet, Take by mouth., Disp: , Rfl:    Ascorbic Acid (VITAMIN C) 1000 MG tablet, Take 1,000 mg by mouth daily. (Patient not taking: Reported on 11/19/2022), Disp: , Rfl:    b complex vitamins capsule, Take 1 capsule by mouth daily. (Patient not taking: Reported on 12/06/2020), Disp: , Rfl:    budesonide (PULMICORT) 0.5 MG/2ML nebulizer solution, Inhale into the lungs., Disp: , Rfl:    Cholecalciferol (VITAMIN D-3) 125 MCG (5000 UT) TABS, Take by mouth daily. (Patient not taking: Reported on 12/06/2020), Disp: , Rfl:    colchicine 0.6 MG tablet, , Disp: , Rfl:    CONTOUR NEXT TEST test strip, once daily Use as instructed. (Patient not taking: Reported on 11/19/2022), Disp: , Rfl:    docusate calcium (SURFAK) 240 MG capsule, Take 240 mg by mouth daily., Disp: , Rfl:    docusate calcium (SURFAK) 240 MG capsule, Take by mouth. (Patient not taking: Reported on 11/19/2022), Disp: , Rfl:    ELIQUIS 5 MG TABS tablet, Take 5 mg by mouth 2 (two) times daily., Disp: , Rfl:    furosemide  (LASIX) 20 MG tablet, Take 20 mg by mouth. (Patient not taking: Reported on 11/19/2022), Disp: , Rfl:    furosemide (LASIX) 40 MG tablet, Take 1 tablet by mouth 2 (two) times daily., Disp: , Rfl:    glipiZIDE (GLUCOTROL XL) 10 MG 24 hr tablet, Take 10 mg by mouth daily with breakfast. (Patient not taking: Reported on 11/19/2022), Disp: , Rfl:    glipiZIDE (GLUCOTROL XL) 10 MG 24 hr tablet, Take 1 tablet by mouth daily., Disp: , Rfl:    ipratropium-albuterol (DUONEB) 0.5-2.5 (3) MG/3ML SOLN, Inhale into the lungs., Disp: , Rfl:    isosorbide mononitrate (IMDUR) 30 MG 24 hr tablet, Take 30 mg by mouth daily., Disp: , Rfl:    lisinopril (ZESTRIL) 40 MG tablet, Take 1 tablet by mouth daily. (Patient not taking: Reported on 11/19/2022), Disp: , Rfl:    lisinopril (ZESTRIL) 40 MG tablet, Take by mouth., Disp: , Rfl:    metFORMIN (GLUCOPHAGE) 1000 MG tablet, Take 1,000 mg by mouth 2 (two) times daily with a meal. (Patient not taking: Reported on 05/05/2022), Disp: , Rfl:    Microlet Lancets MISC, once daily Use as instructed., Disp: , Rfl:    Multiple Vitamin (ANTIOXIDANT A/C/E PO), Take 1 tablet by mouth daily. (Patient not taking: Reported on 12/06/2020), Disp: , Rfl:    Multiple Vitamins-Minerals (ICAPS AREDS 2 PO), Take by mouth. (Patient not taking: Reported on 11/19/2022), Disp: ,  Rfl:    simvastatin (ZOCOR) 40 MG tablet, Take 40 mg by mouth daily at 6 PM. (Patient not taking: Reported on 11/19/2022), Disp: , Rfl:    simvastatin (ZOCOR) 40 MG tablet, Take 1 tablet by mouth at bedtime., Disp: , Rfl:    TRELEGY ELLIPTA 100-62.5-25 MCG/ACT AEPB, Inhale into the lungs., Disp: , Rfl:   Past Medical History: Past Medical History:  Diagnosis Date   AAA (abdominal aortic aneurysm) (HCC)    Anginal pain (HCC)    Arthritis    lower back   Cancer (HCC)    Lung Cancer; Squamous Cell Carcinoma   Cardiomyopathy (HCC)    Coronary artery disease    CTS (carpal tunnel syndrome)    right - numbness thumb and 1st  and 2nd fingers   Diabetes mellitus without complication (HCC)    Dysrhythmia    Atrial Fibrillation   GERD (gastroesophageal reflux disease)    Hypercholesterolemia    Hypertension    Nephropathy, diabetic (HCC)    Onychomycosis    Renal insufficiency    Sleep apnea    Varicose veins    Wears dentures    partial upper   Wears hearing aid in both ears     Tobacco Use: Social History   Tobacco Use  Smoking Status Former   Current packs/day: 0.00   Average packs/day: 3.0 packs/day for 42.0 years (126.0 ttl pk-yrs)   Types: Cigarettes   Start date: 77   Quit date: 1999   Years since quitting: 25.6  Smokeless Tobacco Never    Labs: Review Flowsheet        No data to display           Pulmonary Assessment Scores:  Pulmonary Assessment Scores     Row Name 11/27/22 1204         ADL UCSD   ADL Phase Entry     SOB Score total 26     Rest 0     Walk 2     Stairs 3     Bath 0     Dress 1     Shop 1       CAT Score   CAT Score 18       mMRC Score   mMRC Score 2              UCSD: Self-administered rating of dyspnea associated with activities of daily living (ADLs) 6-point scale (0 = "not at all" to 5 = "maximal or unable to do because of breathlessness")  Scoring Scores range from 0 to 120.  Minimally important difference is 5 units  CAT: CAT can identify the health impairment of COPD patients and is better correlated with disease progression.  CAT has a scoring range of zero to 40. The CAT score is classified into four groups of low (less than 10), medium (10 - 20), high (21-30) and very high (31-40) based on the impact level of disease on health status. A CAT score over 10 suggests significant symptoms.  A worsening CAT score could be explained by an exacerbation, poor medication adherence, poor inhaler technique, or progression of COPD or comorbid conditions.  CAT MCID is 2 points  mMRC: mMRC (Modified Medical Research Council) Dyspnea Scale  is used to assess the degree of baseline functional disability in patients of respiratory disease due to dyspnea. No minimal important difference is established. A decrease in score of 1 point or greater is considered a positive change.   Pulmonary  Function Assessment:  Pulmonary Function Assessment - 11/19/22 1348       Breath   Shortness of Breath Yes;Limiting activity             Exercise Target Goals: Exercise Program Goal: Individual exercise prescription set using results from initial 6 min walk test and THRR while considering  patient's activity barriers and safety.   Exercise Prescription Goal: Initial exercise prescription builds to 30-45 minutes a day of aerobic activity, 2-3 days per week.  Home exercise guidelines will be given to patient during program as part of exercise prescription that the participant will acknowledge.  Education: Aerobic Exercise: - Group verbal and visual presentation on the components of exercise prescription. Introduces F.I.T.T principle from ACSM for exercise prescriptions.  Reviews F.I.T.T. principles of aerobic exercise including progression. Written material given at graduation.   Education: Resistance Exercise: - Group verbal and visual presentation on the components of exercise prescription. Introduces F.I.T.T principle from ACSM for exercise prescriptions  Reviews F.I.T.T. principles of resistance exercise including progression. Written material given at graduation.    Education: Exercise & Equipment Safety: - Individual verbal instruction and demonstration of equipment use and safety with use of the equipment. Flowsheet Row Pulmonary Rehab from 12/31/2022 in [facility] Cardiac and Pulmonary Rehab  Date 11/19/22  Educator [facility]  Instruction Review Code 1- Verbalizes Understanding       Education: Exercise Physiology & General Exercise Guidelines: - Group verbal and written instruction with models to review the exercise physiology of the  cardiovascular system and associated critical values. Provides general exercise guidelines with specific guidelines to those with heart or lung disease.    Education: Flexibility, Balance, Mind/Body Relaxation: - Group verbal and visual presentation with interactive activity on the components of exercise prescription. Introduces F.I.T.T principle from ACSM for exercise prescriptions. Reviews F.I.T.T. principles of flexibility and balance exercise training including progression. Also discusses the mind body connection.  Reviews various relaxation techniques to help reduce and manage stress (i.e. Deep breathing, progressive muscle relaxation, and visualization). Balance handout provided to take home. Written material given at graduation.   Activity Barriers & Risk Stratification:  Activity Barriers & Cardiac Risk Stratification - 11/27/22 1346       Activity Barriers & Cardiac Risk Stratification   Activity Barriers Neck/Spine Problems;Back Problems             6 Minute Walk:  6 Minute Walk     Row Name 11/27/22 1341         6 Minute Walk   Phase Initial     Distance 900 feet     Walk Time 6 minutes     # of Rest Breaks 0     MPH 1.7     METS 1.26     RPE 13     Perceived Dyspnea  3     VO2 Peak 4.39     Symptoms Yes (comment)     Comments SOB     Resting HR 53 bpm     Resting BP 138/56     Resting Oxygen Saturation  95 %     Exercise Oxygen Saturation  during 6 min walk 91 %     Max Ex. HR 110 bpm     Max Ex. BP 158/62     2 Minute Post BP 134/58       Interval HR   1 Minute HR 68     2 Minute HR 79     3 Minute HR  86     4 Minute HR 110     5 Minute HR 106     6 Minute HR 98     2 Minute Post HR 66     Interval Heart Rate? Yes       Interval Oxygen   Interval Oxygen? Yes     Baseline Oxygen Saturation % 95 %     1 Minute Oxygen Saturation % 95 %     1 Minute Liters of Oxygen 0 L  RA     2 Minute Oxygen Saturation % 95 %     2 Minute Liters of Oxygen 0 L   RA     3 Minute Oxygen Saturation % 97 %     3 Minute Liters of Oxygen 0 L  RA     4 Minute Oxygen Saturation % 95 %     4 Minute Liters of Oxygen 0 L  RA     5 Minute Oxygen Saturation % 94 %     5 Minute Liters of Oxygen 0 L  RA     6 Minute Oxygen Saturation % 91 %     6 Minute Liters of Oxygen 0 L  RA     2 Minute Post Oxygen Saturation % 98 %     2 Minute Post Liters of Oxygen 0 L  RA             Oxygen Initial Assessment:  Oxygen Initial Assessment - 11/19/22 1347       Home Oxygen   Home Oxygen Device None    Sleep Oxygen Prescription None    Home Exercise Oxygen Prescription None    Home Resting Oxygen Prescription None      Initial 6 min Walk   Oxygen Used None      Program Oxygen Prescription   Program Oxygen Prescription None      Intervention   Short Term Goals To learn and understand importance of monitoring SPO2 with pulse oximeter and demonstrate accurate use of the pulse oximeter.;To learn and demonstrate proper pursed lip breathing techniques or other breathing techniques. ;To learn and understand importance of maintaining oxygen saturations>88%;To learn and demonstrate proper use of respiratory medications;To learn and exhibit compliance with exercise, home and travel O2 prescription    Long  Term Goals Exhibits compliance with exercise, home  and travel O2 prescription;Verbalizes importance of monitoring SPO2 with pulse oximeter and return demonstration;Exhibits proper breathing techniques, such as pursed lip breathing or other method taught during program session;Demonstrates proper use of MDI's;Compliance with respiratory medication;Maintenance of O2 saturations>88%             Oxygen Re-Evaluation:  Oxygen Re-Evaluation     Row Name 12/17/22 0944             Program Oxygen Prescription   Program Oxygen Prescription None         Home Oxygen   Home Oxygen Device None       Sleep Oxygen Prescription None       Home Exercise Oxygen  Prescription None       Home Resting Oxygen Prescription None         Goals/Expected Outcomes   Short Term Goals To learn and demonstrate proper pursed lip breathing techniques or other breathing techniques.        Long  Term Goals Exhibits proper breathing techniques, such as pursed lip breathing or other method taught during program session  Comments Informed patient how to perform the Pursed Lipped breathing technique. Told patient to Inhale through the nose and out the mouth with pursed lips to keep their airways open, help oxygenate them better, practice when at rest or doing strenuous activity. Patient Verbalizes understanding of technique and will work on and be reiterated during [facility].       Goals/Expected Outcomes Short: use PLB with exertion. Long: use PLB on exertion proficiently and independently.                Oxygen Discharge (Final Oxygen Re-Evaluation):  Oxygen Re-Evaluation - 12/17/22 0944       Program Oxygen Prescription   Program Oxygen Prescription None      Home Oxygen   Home Oxygen Device None    Sleep Oxygen Prescription None    Home Exercise Oxygen Prescription None    Home Resting Oxygen Prescription None      Goals/Expected Outcomes   Short Term Goals To learn and demonstrate proper pursed lip breathing techniques or other breathing techniques.     Long  Term Goals Exhibits proper breathing techniques, such as pursed lip breathing or other method taught during program session    Comments Informed patient how to perform the Pursed Lipped breathing technique. Told patient to Inhale through the nose and out the mouth with pursed lips to keep their airways open, help oxygenate them better, practice when at rest or doing strenuous activity. Patient Verbalizes understanding of technique and will work on and be reiterated during [facility].    Goals/Expected Outcomes Short: use PLB with exertion. Long: use PLB on exertion proficiently and independently.              Initial Exercise Prescription:  Initial Exercise Prescription - 11/27/22 1300       Date of Initial Exercise RX and Referring Provider   Date 11/27/22    Referring Provider Dr. Vida Rigger      Oxygen   Maintain Oxygen Saturation 88% or higher      Treadmill   MPH 1.4    Grade 0    Minutes 15    METs 2.07      NuStep   Level 2    SPM 80    Minutes 15    METs 1.26      Biostep-RELP   Level 1    SPM 50    Minutes 15    METs 1.26      Prescription Details   Frequency (times per week) 2    Duration Progress to 30 minutes of continuous aerobic without signs/symptoms of physical distress      Intensity   THRR 40-80% of Max Heartrate 86-120    Ratings of Perceived Exertion 11-13    Perceived Dyspnea 0-4      Progression   Progression Continue to progress workloads to maintain intensity without signs/symptoms of physical distress.      Resistance Training   Training Prescription Yes    Weight 4 lb    Reps 10-15             Perform Capillary Blood Glucose checks as needed.  Exercise Prescription Changes:   Exercise Prescription Changes     Row Name 11/27/22 1300 12/15/22 0800 12/30/22 1000         Response to Exercise   Blood Pressure (Admit) 138/56 120/50 148/60     Blood Pressure (Exercise) 158/62 162/66 166/62     Blood Pressure (Exit) 134/58 118/62 108/58  Heart Rate (Admit) 53 bpm 52 bpm 62 bpm     Heart Rate (Exercise) 110 bpm 109 bpm 101 bpm     Heart Rate (Exit) 66 bpm 64 bpm 92 bpm     Oxygen Saturation (Admit) 95 % 95 % 96 %     Oxygen Saturation (Exercise) 91 % 94 % 91 %     Oxygen Saturation (Exit) 98 % 97 % 98 %     Rating of Perceived Exertion (Exercise) 13 15 14      Perceived Dyspnea (Exercise) 3 3 2      Symptoms SOB SOB SOB     Comments Results First two weeks of exercise --     Duration -- Progress to 30 minutes of  aerobic without signs/symptoms of physical distress Progress to 30 minutes of  aerobic  without signs/symptoms of physical distress     Intensity -- THRR unchanged THRR unchanged       Progression   Progression -- Continue to progress workloads to maintain intensity without signs/symptoms of physical distress. Continue to progress workloads to maintain intensity without signs/symptoms of physical distress.     Average METs -- 2.61 2.35       Resistance Training   Training Prescription -- Yes Yes     Weight -- 4 lb 4 lb     Reps -- 10-15 10-15       Interval Training   Interval Training -- No No       Treadmill   MPH -- 1.4 1.6     Grade -- 0 0     Minutes -- 15 15     METs -- 2.07 2.23       Recumbant Bike   Level -- 4 3     Watts -- 19 19     Minutes -- 15 15     METs -- 2.47 2.47       NuStep   Level -- 5 4     Minutes -- 15 15     METs -- 4.1 2.5       Oxygen   Maintain Oxygen Saturation -- 88% or higher 88% or higher              Exercise Comments:   Exercise Comments     Row Name 12/03/22 1610           Exercise Comments First full day of exercise!  Patient was oriented to gym and equipment including functions, settings, policies, and procedures.  Patient's individual exercise prescription and treatment plan were reviewed.  All starting workloads were established based on the results of the 6 minute walk test done at initial orientation visit.  The plan for exercise progression was also introduced and progression will be customized based on patient's performance and goals.                Exercise Goals and Review:   Exercise Goals     Row Name 11/27/22 1247             Exercise Goals   Increase Physical Activity Yes       Intervention Provide advice, education, support and counseling about physical activity/exercise needs.;Develop an individualized exercise prescription for aerobic and resistive training based on initial evaluation findings, risk stratification, comorbidities and participant's personal goals.       Expected  Outcomes Short Term: Attend rehab on a regular basis to increase amount of physical activity.;Long Term: Add in home exercise to  make exercise part of routine and to increase amount of physical activity.;Long Term: Exercising regularly at least 3-5 days a week.       Increase Strength and Stamina Yes       Intervention Provide advice, education, support and counseling about physical activity/exercise needs.;Develop an individualized exercise prescription for aerobic and resistive training based on initial evaluation findings, risk stratification, comorbidities and participant's personal goals.       Expected Outcomes Short Term: Increase workloads from initial exercise prescription for resistance, speed, and METs.;Long Term: Improve cardiorespiratory fitness, muscular endurance and strength as measured by increased METs and functional capacity ( );Short Term: Perform resistance training exercises routinely during rehab and add in resistance training at home       Able to understand and use rate of perceived exertion (RPE) scale Yes       Intervention Provide education and explanation on how to use RPE scale       Expected Outcomes Short Term: Able to use RPE daily in rehab to express subjective intensity level;Long Term:  Able to use RPE to guide intensity level when exercising independently       Able to understand and use Dyspnea scale Yes       Intervention Provide education and explanation on how to use Dyspnea scale       Expected Outcomes Short Term: Able to use Dyspnea scale daily in rehab to express subjective sense of shortness of breath during exertion;Long Term: Able to use Dyspnea scale to guide intensity level when exercising independently       Knowledge and understanding of Target Heart Rate Range (THRR) Yes       Intervention Provide education and explanation of THRR including how the numbers were predicted and where they are located for reference       Expected Outcomes Short Term:  Able to state/look up THRR;Long Term: Able to use THRR to govern intensity when exercising independently;Short Term: Able to use daily as guideline for intensity in rehab       Able to check pulse independently Yes       Intervention Provide education and demonstration on how to check pulse in carotid and radial arteries.;Review the importance of being able to check your own pulse for safety during independent exercise       Expected Outcomes Short Term: Able to explain why pulse checking is important during independent exercise;Long Term: Able to check pulse independently and accurately       Understanding of Exercise Prescription Yes       Intervention Provide education, explanation, and written materials on patient's individual exercise prescription       Expected Outcomes Short Term: Able to explain program exercise prescription;Long Term: Able to explain home exercise prescription to exercise independently                Exercise Goals Re-Evaluation :  Exercise Goals Re-Evaluation     Row Name 12/03/22 2130 12/15/22 0833 12/30/22 1037         Exercise Goal Re-Evaluation   Exercise Goals Review Able to understand and use rate of perceived exertion (RPE) scale;Able to understand and use Dyspnea scale;Knowledge and understanding of Target Heart Rate Range (THRR);Understanding of Exercise Prescription Increase Physical Activity;Increase Strength and Stamina;Understanding of Exercise Prescription Increase Physical Activity;Increase Strength and Stamina;Understanding of Exercise Prescription     Comments Reviewed RPE  and dyspnea scale, THR and program prescription with pt today.  Pt voiced understanding and was given a  copy of goals to take home. Imothy is off to a good start in the program. He has done well with the treadmill at a speed of 1.4 mph with no incline. He also was able to improve to level 4 on the recumbent bike andlevel 5 on the T4 nustep. We will continue to monitor his progress.  Ormal is doing well in rehab. He increased his speed on the treadmill from 1.4 mph to 1.6 mph with no incline. He also has maintained consistent workloads on the T4 nustep and recumbent bike. We will continue to monitor his progress in the program.     Expected Outcomes Short: Use RPE daily to regulate intensity. Long: Follow program prescription in THR. Short: Continue to follow current exercise prescription. Long: Continue to increased MET level and stamina. Short: Try level 5 on the T4 nustep. Long: Continue exercise to improve strength and stamina.              Discharge Exercise Prescription (Final Exercise Prescription Changes):  Exercise Prescription Changes - 12/30/22 1000       Response to Exercise   Blood Pressure (Admit) 148/60    Blood Pressure (Exercise) 166/62    Blood Pressure (Exit) 108/58    Heart Rate (Admit) 62 bpm    Heart Rate (Exercise) 101 bpm    Heart Rate (Exit) 92 bpm    Oxygen Saturation (Admit) 96 %    Oxygen Saturation (Exercise) 91 %    Oxygen Saturation (Exit) 98 %    Rating of Perceived Exertion (Exercise) 14    Perceived Dyspnea (Exercise) 2    Symptoms SOB    Duration Progress to 30 minutes of  aerobic without signs/symptoms of physical distress    Intensity THRR unchanged      Progression   Progression Continue to progress workloads to maintain intensity without signs/symptoms of physical distress.    Average METs 2.35      Resistance Training   Training Prescription Yes    Weight 4 lb    Reps 10-15      Interval Training   Interval Training No      Treadmill   MPH 1.6    Grade 0    Minutes 15    METs 2.23      Recumbant Bike   Level 3    Watts 19    Minutes 15    METs 2.47      NuStep   Level 4    Minutes 15    METs 2.5      Oxygen   Maintain Oxygen Saturation 88% or higher             Nutrition:  Target Goals: Understanding of nutrition guidelines, daily intake of sodium 1500mg , cholesterol 200mg , calories 30%  from fat and 7% or less from saturated fats, daily to have 5 or more servings of fruits and vegetables.  Education: All About Nutrition: -Group instruction provided by verbal, written material, interactive activities, discussions, models, and posters to present general guidelines for heart healthy nutrition including fat, fiber, MyPlate, the role of sodium in heart healthy nutrition, utilization of the nutrition label, and utilization of this knowledge for meal planning. Follow up email sent as well. Written material given at graduation. Flowsheet Row Pulmonary Rehab from 12/31/2022 in [facility] Cardiac and Pulmonary Rehab  Education need identified 11/27/22       Biometrics:  Pre Biometrics - 11/27/22 1346       Pre Biometrics  Height 6\' 1"  (1.854 m)    Weight 279 lb 9.6 oz (126.8 kg)    Waist Circumference 50 inches    Hip Circumference 53 inches    Waist to Hip Ratio 0.94 %    BMI (Calculated) 36.9    Single Leg Stand 0 seconds              Nutrition Therapy Plan and Nutrition Goals:  Nutrition Therapy & Goals - 11/27/22 1131       Nutrition Therapy   Diet Mediterranean    Protein (specify units) 90    Fiber 30 grams    Whole Grain Foods 3 servings    Saturated Fats 15 max. grams    Fruits and Vegetables 5 servings/day    Sodium 1.5 grams      Personal Nutrition Goals   Nutrition Goal Eat a protein at every meal (use protein shake if needed)    Personal Goal #2 Small frequent meals    Personal Goal #3 Try lentils as plant based protein    Comments He reports he doesn't eat breakfast, has a large lunch, small dinner. He says dinner is all veggies. Talked tp him about adding protein. Reviewed primer protein shake, low calorie (he is trying to lose weight) but RD still wants to ensure he has adequate protein intake. He also likes salmon. He is not sure if he has tried lentils, encouraged him to try them. His wife reads labels and watches for sodium and fat content. He eats  out a lot, wants to work on eating out less. Reviewed mediterranean diet handout, focusing on eating small frequent meals, protein at every meal.      Intervention Plan   Intervention Prescribe, educate and counsel regarding individualized specific dietary modifications aiming towards targeted core components such as weight, hypertension, lipid management, diabetes, heart failure and other comorbidities.;Nutrition handout(s) given to patient.    Expected Outcomes Short Term Goal: Understand basic principles of dietary content, such as calories, fat, sodium, cholesterol and nutrients.;Short Term Goal: A plan has been developed with personal nutrition goals set during dietitian appointment.;Long Term Goal: Adherence to prescribed nutrition plan.             Nutrition Assessments:  MEDIFICTS Score Key: ?70 Need to make dietary changes  40-70 Heart Healthy Diet ? 40 Therapeutic Level Cholesterol Diet  Flowsheet Row Pulmonary Rehab from 11/27/2022 in [facility] Cardiac and Pulmonary Rehab  Picture Your Plate Total Score on Admission 52      Picture Your Plate Scores: <16 Unhealthy dietary pattern with much room for improvement. 41-50 Dietary pattern unlikely to meet recommendations for good health and room for improvement. 51-60 More healthful dietary pattern, with some room for improvement.  >60 Healthy dietary pattern, although there may be some specific behaviors that could be improved.   Nutrition Goals Re-Evaluation:  Nutrition Goals Re-Evaluation     Row Name 12/17/22 0950             Goals   Current Weight 280 lb (127 kg)       Comment Patient was informed on why it is important to maintain a balanced diet when dealing with Respiratory issues. Explained that it takes a lot of energy to breath and when they are short of breath often they will need to have a good diet to help keep up with the calories they are expending for breathing.       Expected Outcome Short: Choose and plan  snacks accordingly  to patients caloric intake to improve breathing. Long: Maintain a diet independently that meets their caloric intake to aid in daily shortness of breath.                Nutrition Goals Discharge (Final Nutrition Goals Re-Evaluation):  Nutrition Goals Re-Evaluation - 12/17/22 0950       Goals   Current Weight 280 lb (127 kg)    Comment Patient was informed on why it is important to maintain a balanced diet when dealing with Respiratory issues. Explained that it takes a lot of energy to breath and when they are short of breath often they will need to have a good diet to help keep up with the calories they are expending for breathing.    Expected Outcome Short: Choose and plan snacks accordingly to patients caloric intake to improve breathing. Long: Maintain a diet independently that meets their caloric intake to aid in daily shortness of breath.             Psychosocial: Target Goals: Acknowledge presence or absence of significant depression and/or stress, maximize coping skills, provide positive support system. Participant is able to verbalize types and ability to use techniques and skills needed for reducing stress and depression.   Education: Stress, Anxiety, and Depression - Group verbal and visual presentation to define topics covered.  Reviews how body is impacted by stress, anxiety, and depression.  Also discusses healthy ways to reduce stress and to treat/manage anxiety and depression.  Written material given at graduation. Flowsheet Row Pulmonary Rehab from 12/31/2022 in [facility] Cardiac and Pulmonary Rehab  Date 12/31/22  Educator SB  Instruction Review Code 1- [employer] Understanding       Education: Sleep Hygiene -Provides group verbal and written instruction about how sleep can affect your health.  Define sleep hygiene, discuss sleep cycles and impact of sleep habits. Review good sleep hygiene tips.    Initial Review & Psychosocial Screening:   Initial Psych Review & Screening - 11/19/22 1349       Initial Review   Current issues with None Identified      Family Dynamics   Good Support System? 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 He is retiring from an [employer]. He has good family support system. His wife and his faith are big supporters in his life.      Barriers   Psychosocial barriers to participate in program There are no identifiable barriers or psychosocial needs.;The patient should benefit from training in stress management and relaxation.      Screening Interventions   Interventions To provide support and resources with identified psychosocial needs;Provide feedback about the scores to participant;Encouraged to exercise    Expected Outcomes Short Term goal: Utilizing psychosocial counselor, staff and physician to assist with identification of specific Stressors or current issues interfering with healing process. Setting desired goal for each stressor or current issue identified.;Long Term Goal: Stressors or current issues are controlled or eliminated.;Short Term goal: Identification and review with participant of any Quality of Life or Depression concerns found by scoring the questionnaire.;Long Term goal: The participant improves quality of Life and PHQ9 Scores as seen by post scores and/or verbalization of changes             Quality of Life Scores:  Scores of 19 and below usually indicate a poorer quality of life in these areas.  A difference of  2-3 points is a  clinically meaningful difference.  A difference of 2-3 points in the total score of the Quality of Life Index has been associated with significant improvement in overall quality of life, self-image, physical symptoms, and general health in studies assessing change in quality of life.  PHQ-9: Review Flowsheet       12/17/2022 11/27/2022 12/06/2020  Depression screen PHQ 2/9  Decreased Interest 0 1 0   Down, Depressed, Hopeless 0 1 1  PHQ - 2 Score 0 2 1  Altered sleeping 0 1 -  Tired, decreased energy 2 1 -  Change in appetite 0 0 -  Feeling bad or failure about yourself  0 1 -  Trouble concentrating 0 0 -  Moving slowly or fidgety/restless 0 0 -  Suicidal thoughts 0 0 -  PHQ-9 Score 2 5 -  Difficult doing work/chores Not difficult at all Not difficult at all -    Details           Interpretation of Total Score  Total Score Depression Severity:  1-4 = Minimal depression, 5-9 = Mild depression, 10-14 = Moderate depression, 15-19 = Moderately severe depression, 20-27 = Severe depression   Psychosocial Evaluation and Intervention:  Psychosocial Evaluation - 11/19/22 1353       Psychosocial Evaluation & Interventions   Interventions Encouraged to exercise with the program and follow exercise prescription;Relaxation education;Stress management education    Comments 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 He is retiring from an [employer]. He has good family support system. His wife and his faith are big supporters in his life.    Expected Outcomes Short: Start [facility] to help with mood. Long: Maintain a healthy mental state.    Continue Psychosocial Services  Follow up required by staff             Psychosocial Re-Evaluation:  Psychosocial Re-Evaluation     Row Name 12/17/22 [phone]             Psychosocial Re-Evaluation   Current issues with None Identified       Comments Reviewed patient health questionnaire (PHQ-9) with patient for follow up. Previously, patients score indicated signs/symptoms of depression.  Reviewed to see if patient is improving symptom wise while in program.  Score improved and patient states that it is because he has been able to gain some strength.       Expected Outcomes Short: Continue to attend [facility] regularly for regular exercise and social engagement. Long: Continue to improve symptoms and manage a  positive mental state.       Interventions Encouraged to attend Pulmonary Rehabilitation for the exercise       Continue Psychosocial Services  Follow up required by staff                Psychosocial Discharge (Final Psychosocial Re-Evaluation):  Psychosocial Re-Evaluation - 12/17/22 0952       Psychosocial Re-Evaluation   Current issues with None Identified    Comments Reviewed patient health questionnaire (PHQ-9) with patient for follow up. Previously, patients score indicated signs/symptoms of depression.  Reviewed to see if patient is improving symptom wise while in program.  Score improved and patient states that it is because he has been able to gain some strength.    Expected Outcomes Short: Continue to attend [facility] regularly for regular exercise and social engagement. Long: Continue to improve symptoms and manage a positive mental state.    Interventions Encouraged to attend Pulmonary  Rehabilitation for the exercise    Continue Psychosocial Services  Follow up required by staff             Education: Education Goals: Education classes will be provided on a weekly basis, covering required topics. Participant will state understanding/return demonstration of topics presented.  Learning Barriers/Preferences:  Learning Barriers/Preferences - 11/19/22 1348       Learning Barriers/Preferences   Learning Barriers None    Learning Preferences None             General Pulmonary Education Topics:  Infection Prevention: - Provides verbal and written material to individual with discussion of infection control including proper hand washing and proper equipment cleaning during exercise session. Flowsheet Row Pulmonary Rehab from 12/31/2022 in [facility] Cardiac and Pulmonary Rehab  Date 11/19/22  Educator [facility]  Instruction Review Code 1- Verbalizes Understanding       Falls Prevention: - Provides verbal and written material to individual with discussion of falls  prevention and safety. Flowsheet Row Pulmonary Rehab from 12/31/2022 in [facility] Cardiac and Pulmonary Rehab  Date 11/19/22  Educator [facility]  Instruction Review Code 1- Verbalizes Understanding       Chronic Lung Disease Review: - Group verbal instruction with posters, models, PowerPoint presentations and videos,  to review new updates, new respiratory medications, new advancements in procedures and treatments. Providing information on websites and "800" numbers for continued self-education. Includes information about supplement oxygen, available portable oxygen systems, continuous and intermittent flow rates, oxygen safety, concentrators, and Medicare reimbursement for oxygen. Explanation of Pulmonary Drugs, including class, frequency, complications, importance of spacers, rinsing mouth after steroid MDI's, and proper cleaning methods for nebulizers. Review of basic lung anatomy and physiology related to function, structure, and complications of lung disease. Review of risk factors. Discussion about methods for diagnosing sleep apnea and types of masks and machines for OSA. Includes a review of the use of types of environmental controls: home humidity, furnaces, filters, dust mite/pet prevention, HEPA vacuums. Discussion about weather changes, air quality and the benefits of nasal washing. Instruction on Warning signs, infection symptoms, calling MD promptly, preventive modes, and value of vaccinations. Review of effective airway clearance, coughing and/or vibration techniques. Emphasizing that all should Create an Action Plan. Written material given at graduation. Flowsheet Row Pulmonary Rehab from 12/31/2022 in [facility] Cardiac and Pulmonary Rehab  Education need identified 11/27/22       AED/CPR: - Group verbal and written instruction with the use of models to demonstrate the basic use of the AED with the basic ABC's of resuscitation.    Anatomy and Cardiac Procedures: - Group verbal and visual  presentation and models provide information about basic cardiac anatomy and function. Reviews the testing methods done to diagnose heart disease and the outcomes of the test results. Describes the treatment choices: Medical Management, Angioplasty, or Coronary Bypass Surgery for treating various heart conditions including Myocardial Infarction, Angina, Valve Disease, and Cardiac Arrhythmias.  Written material given at graduation.   Medication Safety: - Group verbal and visual instruction to review commonly prescribed medications for heart and lung disease. Reviews the medication, class of the drug, and side effects. Includes the steps to properly store meds and maintain the prescription regimen.  Written material given at graduation. Flowsheet Row Pulmonary Rehab from 12/31/2022 in [facility] Cardiac and Pulmonary Rehab  Education need identified 11/27/22       Other: -Provides group and verbal instruction on various topics (see comments)   Knowledge Questionnaire Score:  Knowledge Questionnaire Score -  11/27/22 1203       Knowledge Questionnaire Score   Pre Score 12/18              Core Components/Risk Factors/Patient Goals at Admission:  Personal Goals and Risk Factors at Admission - 11/19/22 1348       Core Components/Risk Factors/Patient Goals on Admission    Weight Management Yes;Weight Loss    Intervention Weight Management: Develop a combined nutrition and exercise program designed to reach desired caloric intake, while maintaining appropriate intake of nutrient and fiber, sodium and fats, and appropriate energy expenditure required for the weight goal.;Weight Management: Provide education and appropriate resources to help participant work on and attain dietary goals.;Weight Management/Obesity: Establish reasonable short term and long term weight goals.;Obesity: Provide education and appropriate resources to help participant work on and attain dietary goals.    Expected Outcomes  Short Term: Continue to assess and modify interventions until short term weight is achieved;Long Term: Adherence to nutrition and physical activity/exercise program aimed toward attainment of established weight goal;Weight Loss: Understanding of general recommendations for a balanced deficit meal plan, which promotes 1-2 lb weight loss per week and includes a negative energy balance of [phone] kcal/d;Understanding recommendations for meals to include 15-35% energy as protein, 25-35% energy from fat, 35-60% energy from carbohydrates, less than 200mg  of dietary cholesterol, 20-35 gm of total fiber daily;Understanding of distribution of calorie intake throughout the day with the consumption of 4-5 meals/snacks    Improve shortness of breath with ADL's Yes    Intervention Provide education, individualized exercise plan and daily activity instruction to help decrease symptoms of SOB with activities of daily living.    Expected Outcomes Short Term: Improve cardiorespiratory fitness to achieve a reduction of symptoms when performing ADLs;Long Term: Be able to perform more ADLs without symptoms or delay the onset of symptoms    Diabetes Yes    Intervention Provide education about signs/symptoms and action to take for hypo/hyperglycemia.;Provide education about proper nutrition, including hydration, and aerobic/resistive exercise prescription along with prescribed medications to achieve blood glucose in normal ranges: Fasting glucose 65-99 mg/dL    Expected Outcomes Short Term: Participant verbalizes understanding of the signs/symptoms and immediate care of hyper/hypoglycemia, proper foot care and importance of medication, aerobic/resistive exercise and nutrition plan for blood glucose control.;Long Term: Attainment of HbA1C < 7%.    Heart Failure Yes    Intervention Provide a combined exercise and nutrition program that is supplemented with education, support and counseling about heart failure. Directed toward  relieving symptoms such as shortness of breath, decreased exercise tolerance, and extremity edema.    Expected Outcomes Improve functional capacity of life;Short term: Attendance in program 2-3 days a week with increased exercise capacity. Reported lower sodium intake. Reported increased fruit and vegetable intake. Reports medication compliance.;Short term: Daily weights obtained and reported for increase. Utilizing diuretic protocols set by physician.;Long term: Adoption of self-care skills and reduction of barriers for early signs and symptoms recognition and intervention leading to self-care maintenance.    Hypertension Yes    Intervention Provide education on lifestyle modifcations including regular physical activity/exercise, weight management, moderate sodium restriction and increased consumption of fresh fruit, vegetables, and low fat dairy, alcohol moderation, and smoking cessation.;Monitor prescription use compliance.    Expected Outcomes Short Term: Continued assessment and intervention until BP is < 140/51mm HG in hypertensive participants. < 130/15mm HG in hypertensive participants with diabetes, heart failure or chronic kidney disease.;Long Term: Maintenance of blood pressure at goal levels.  Lipids Yes    Intervention Provide education and support for participant on nutrition & aerobic/resistive exercise along with prescribed medications to achieve LDL 70mg , HDL >40mg .    Expected Outcomes Short Term: Participant states understanding of desired cholesterol values and is compliant with medications prescribed. Participant is following exercise prescription and nutrition guidelines.;Long Term: Cholesterol controlled with medications as prescribed, with individualized exercise RX and with personalized nutrition plan. Value goals: LDL < 70mg , HDL > 40 mg.             Education:Diabetes - Individual verbal and written instruction to review signs/symptoms of diabetes, desired ranges of  glucose level fasting, after meals and with exercise. Acknowledge that pre and post exercise glucose checks will be done for 3 sessions at entry of program. Flowsheet Row Pulmonary Rehab from 12/31/2022 in [facility] Cardiac and Pulmonary Rehab  Date 11/19/22  Educator [facility]  Instruction Review Code 1- Verbalizes Understanding       Know Your Numbers and Heart Failure: - Group verbal and visual instruction to discuss disease risk factors for cardiac and pulmonary disease and treatment options.  Reviews associated critical values for Overweight/Obesity, Hypertension, Cholesterol, and Diabetes.  Discusses basics of heart failure: signs/symptoms and treatments.  Introduces Heart Failure Zone chart for action plan for heart failure.  Written material given at graduation. Flowsheet Row Pulmonary Rehab from 12/31/2022 in [facility] Cardiac and Pulmonary Rehab  Education need identified 11/27/22       Core Components/Risk Factors/Patient Goals Review:   Goals and Risk Factor Review     Row Name 12/17/22 0945             Core Components/Risk Factors/Patient Goals Review   Personal Goals Review Improve shortness of breath with ADL's       Review Spoke to patient about their shortness of breath and what they can do to improve. Patient has been informed of breathing techniques when starting the program. Patient is informed to tell staff if they have had any med changes and that certain meds they are taking or not taking can be causing shortness of breath.       Expected Outcomes Short: Attend [facility] regularly to improve shortness of breath with ADL's. Long: maintain independence with ADL's                Core Components/Risk Factors/Patient Goals at Discharge (Final Review):   Goals and Risk Factor Review - 12/17/22 0945       Core Components/Risk Factors/Patient Goals Review   Personal Goals Review Improve shortness of breath with ADL's    Review Spoke to patient about their shortness of breath  and what they can do to improve. Patient has been informed of breathing techniques when starting the program. Patient is informed to tell staff if they have had any med changes and that certain meds they are taking or not taking can be causing shortness of breath.    Expected Outcomes Short: Attend [facility] regularly to improve shortness of breath with ADL's. Long: maintain independence with ADL's             ITP Comments:  ITP Comments     Row Name 11/19/22 1347 11/27/22 1202 12/03/22 0929 12/09/22 1407 01/07/23 1127   ITP Comments Virtual Visit completed. Patient informed on EP and RD appointment and 6 Minute walk test. Patient also informed of patient health questionnaires on My Chart. Patient Verbalizes understanding. Visit diagnosis can be found in [facility] 08/06/[facility]22. Completed and gym orientation. Initial  ITP created and sent for review to Dr. Vida Rigger, Medical Director. First full day of exercise!  Patient was oriented to gym and equipment including functions, settings, policies, and procedures.  Patient's individual exercise prescription and treatment plan were reviewed.  All starting workloads were established based on the results of the 6 minute walk test done at initial orientation visit.  The plan for exercise progression was also introduced and progression will be customized based on patient's performance and goals. 30 Day review completed. Medical Director ITP review done, changes made as directed, and signed approval by Medical Director.   new to program 30 Day review completed. Medical Director ITP review done, changes made as directed, and signed approval by Medical Director.            Comments:

## 2023-01-09 ENCOUNTER — Encounter: Payer: Medicare Other | Admitting: *Deleted

## 2023-01-09 DIAGNOSIS — J449 Chronic obstructive pulmonary disease, unspecified: Secondary | ICD-10-CM | POA: Diagnosis not present

## 2023-01-09 NOTE — Progress Notes (Signed)
Daily Session Note  Patient Details  Name: Glen Jenkins MRN: [id number] Date of Birth: Oct 24, 1940 Referring Provider:   Flowsheet Row Pulmonary Rehab from 11/27/2022 in [facility] Cardiac and Pulmonary Rehab  Referring Provider Dr. Vida Rigger       Encounter Date: 01/09/2023  Check In:  Session Check In - 01/09/23 1036       Check-In   Supervising physician immediately available to respond to emergencies See telemetry face sheet for immediately available ER MD    Location [facility]-Cardiac & Pulmonary Rehab    Staff Present Cora Collum, RN, BSN, CCRP;Joseph Hood, RCP,RRT,BSRT;Noah Tickle, [place], Exercise Physiologist    Virtual Visit No    Medication changes reported     No    Fall or balance concerns reported    No    Warm-up and Cool-down Performed on first and last piece of equipment    Resistance Training Performed Yes    VAD Patient? No    PAD/SET Patient? No      Pain Assessment   Currently in Pain? No/denies                Social History   Tobacco Use  Smoking Status Former   Current packs/day: 0.00   Average packs/day: 3.0 packs/day for 42.0 years (126.0 ttl pk-yrs)   Types: Cigarettes   Start date: 8   Quit date: 1999   Years since quitting: 25.6  Smokeless Tobacco Never    Goals Met:  Proper associated with RPD/PD & O2 Sat Independence with exercise equipment Exercise tolerated well No report of concerns or symptoms today  Goals Unmet:  Not Applicable  Comments: Pt able to follow exercise prescription today without complaint.  Will continue to monitor for progression.    Dr. Bethann Punches is Medical Director for [facility] Cardiac Rehabilitation.  Dr. Vida Rigger is Medical Director for [facility] Pulmonary Rehabilitation.

## 2023-01-14 ENCOUNTER — Encounter: Payer: Medicare Other | Admitting: *Deleted

## 2023-01-14 DIAGNOSIS — J449 Chronic obstructive pulmonary disease, unspecified: Secondary | ICD-10-CM

## 2023-01-14 NOTE — Progress Notes (Signed)
Daily Session Note  Patient Details  Name: Glen Jenkins MRN: [id number] Date of Birth: 09/25/1940 Referring Provider:   Flowsheet Row Pulmonary Rehab from 11/27/2022 in [facility] Cardiac and Pulmonary Rehab  Referring Provider Dr. Vida Rigger       Encounter Date: 01/14/2023  Check In:  Session Check In - 01/14/23 0936       Check-In   Supervising physician immediately available to respond to emergencies See telemetry face sheet for immediately available ER MD    Location [facility]-Cardiac & Pulmonary Rehab    Staff Present Rory Percy, MS, Exercise Physiologist; Katrinka Blazing, RN, ADN;Susanne Bice, RN, BSN, CCRP    Virtual Visit No    Medication changes reported     No    Fall or balance concerns reported    No    Warm-up and Cool-down Performed on first and last piece of equipment    Resistance Training Performed Yes    VAD Patient? No    PAD/SET Patient? No      Pain Assessment   Currently in Pain? No/denies                Social History   Tobacco Use  Smoking Status Former   Current packs/day: 0.00   Average packs/day: 3.0 packs/day for 42.0 years (126.0 ttl pk-yrs)   Types: Cigarettes   Start date: 89   Quit date: 1999   Years since quitting: 25.6  Smokeless Tobacco Never    Goals Met:  Independence with exercise equipment Exercise tolerated well No report of concerns or symptoms today Strength training completed today  Goals Unmet:  Not Applicable  Comments: Pt able to follow exercise prescription today without complaint.  Will continue to monitor for progression.    Dr. Bethann Punches is Medical Director for [facility] Cardiac Rehabilitation.  Dr. Vida Rigger is Medical Director for [facility] Pulmonary Rehabilitation.

## 2023-01-16 ENCOUNTER — Encounter: Payer: Medicare Other | Admitting: *Deleted

## 2023-01-16 ENCOUNTER — Ambulatory Visit
Admission: RE | Admit: 2023-01-16 | Discharge: 2023-01-16 | Disposition: A | Discharge status: Home / Self Care | Payer: Medicare Other | Source: Ambulatory Visit | Attending: Pulmonary Disease | Admitting: Pulmonary Disease

## 2023-01-16 DIAGNOSIS — J439 Emphysema, unspecified: Secondary | ICD-10-CM | POA: Insufficient documentation

## 2023-01-16 DIAGNOSIS — I700 Atherosclerosis of aorta: Secondary | ICD-10-CM | POA: Diagnosis not present

## 2023-01-16 DIAGNOSIS — R918 Other nonspecific abnormal finding of lung field: Secondary | ICD-10-CM | POA: Insufficient documentation

## 2023-01-16 DIAGNOSIS — J449 Chronic obstructive pulmonary disease, unspecified: Secondary | ICD-10-CM

## 2023-01-16 NOTE — Progress Notes (Signed)
Daily Session Note  Patient Details  Name: Glen Jenkins MRN: [id number] Date of Birth: November 22, 1940 Referring Provider:   Flowsheet Row Pulmonary Rehab from 11/27/2022 in [facility] Cardiac and Pulmonary Rehab  Referring Provider Dr. Vida Rigger       Encounter Date: 01/16/2023  Check In:  Session Check In - 01/16/23 0939       Check-In   Location [facility]-Cardiac & Pulmonary Rehab    Staff Present Cyndia Diver, RN, BSN, MA;Susanne Bice, RN, BSN, CCRP;Noah Tickle, BS, Exercise Physiologist    Virtual Visit No    Medication changes reported     No    Fall or balance concerns reported    No    Tobacco Cessation No Change    Warm-up and Cool-down Performed on first and last piece of equipment    Resistance Training Performed Yes    VAD Patient? No    PAD/SET Patient? No      Pain Assessment   Currently in Pain? No/denies                Social History   Tobacco Use  Smoking Status Former   Current packs/day: 0.00   Average packs/day: 3.0 packs/day for 42.0 years (126.0 ttl pk-yrs)   Types: Cigarettes   Start date: 57   Quit date: 1999   Years since quitting: 25.6  Smokeless Tobacco Never    Goals Met:  Independence with exercise equipment Exercise tolerated well No report of concerns or symptoms today  Goals Unmet:  Not Applicable  Comments: Pt able to follow exercise prescription today without complaint.  Will continue to monitor for progression.    Dr. Bethann Punches is Medical Director for [facility] Cardiac Rehabilitation.  Dr. Vida Rigger is Medical Director for [facility] Pulmonary Rehabilitation.

## 2023-01-16 NOTE — Progress Notes (Signed)
Daily Session Note  Patient Details  Name: Glen Jenkins MRN: [id number] Date of Birth: [birth year] Referring Provider:   Flowsheet Row Pulmonary Rehab from 11/27/2022 in [facility] Cardiac and Pulmonary Rehab  Referring Provider Dr. Vida Rigger       Encounter Date: 01/16/2023  Check In:  Session Check In - 01/16/23 0939       Check-In   Location [facility]-Cardiac & Pulmonary Rehab    Staff Present Cyndia Diver, RN, BSN, MA;Susanne Bice, RN, BSN, CCRP;Noah Tickle, BS, Exercise Physiologist    Virtual Visit No    Medication changes reported     No    Fall or balance concerns reported    No    Tobacco Cessation No Change    Warm-up and Cool-down Performed on first and last piece of equipment    Resistance Training Performed Yes    VAD Patient? No    PAD/SET Patient? No      Pain Assessment   Currently in Pain? No/denies                Social History   Tobacco Use  Smoking Status Former   Current packs/day: 0.00   Average packs/day: 3.0 packs/day for 42.0 years (126.0 ttl pk-yrs)   Types: Cigarettes   Start date: 42   Quit date: 1999   Years since quitting: 25.6  Smokeless Tobacco Never    Goals Met:   Goals Unmet:    Comments: .[email or web address]   Dr. Bethann Punches is Medical Director for [facility] Cardiac Rehabilitation.  Dr. Vida Rigger is Medical Director for [facility] Pulmonary Rehabilitation.

## 2023-01-21 ENCOUNTER — Encounter: Payer: Medicare Other | Admitting: *Deleted

## 2023-01-21 DIAGNOSIS — J449 Chronic obstructive pulmonary disease, unspecified: Secondary | ICD-10-CM

## 2023-01-21 NOTE — Progress Notes (Signed)
Daily Session Note  Patient Details  Name: Glen Jenkins MRN: [id number] Date of Birth: [birth year] Referring Provider:   Flowsheet Row Pulmonary Rehab from 11/27/2022 in [facility] Cardiac and Pulmonary Rehab  Referring Provider Dr. Vida Rigger       Encounter Date: 01/21/2023  Check In:  Session Check In - 01/21/23 0917       Check-In   Supervising physician immediately available to respond to emergencies See telemetry face sheet for immediately available ER MD    Location [facility]-Cardiac & Pulmonary Rehab    Staff Present Cora Collum, RN, BSN, CCRP;Margaret Best, MS, Exercise Physiologist;Zoriah Pulice Katrinka Blazing, RN, ADN    Virtual Visit No    Medication changes reported     No    Fall or balance concerns reported    No    Warm-up and Cool-down Performed on first and last piece of equipment    Resistance Training Performed Yes    VAD Patient? No    PAD/SET Patient? No      Pain Assessment   Currently in Pain? No/denies                Social History   Tobacco Use  Smoking Status Former   Current packs/day: 0.00   Average packs/day: 3.0 packs/day for 42.0 years (126.0 ttl pk-yrs)   Types: Cigarettes   Start date: 13   Quit date: 1999   Years since quitting: 25.6  Smokeless Tobacco Never    Goals Met:  Independence with exercise equipment Exercise tolerated well No report of concerns or symptoms today Strength training completed today  Goals Unmet:  Not Applicable  Comments: Pt able to follow exercise prescription today without complaint.  Will continue to monitor for progression.    Dr. Bethann Punches is Medical Director for [facility] Cardiac Rehabilitation.  Dr. Vida Rigger is Medical Director for [facility] Pulmonary Rehabilitation.

## 2023-01-23 ENCOUNTER — Encounter: Payer: Medicare Other | Admitting: *Deleted

## 2023-01-23 DIAGNOSIS — J449 Chronic obstructive pulmonary disease, unspecified: Secondary | ICD-10-CM | POA: Diagnosis not present

## 2023-01-23 NOTE — Progress Notes (Signed)
Daily Session Note  Patient Details  Name: [facility] MRN: [id number] Date of Birth: Feb 21, 1941 Referring Provider:   Flowsheet Row Pulmonary Rehab from 11/27/2022 in [facility] Cardiac and Pulmonary Rehab  Referring Provider Dr. Vida Rigger       Encounter Date: 01/23/2023  Check In:  Session Check In - 01/23/23 1042       Check-In   Supervising physician immediately available to respond to emergencies See telemetry face sheet for immediately available ER MD    Location [facility]-Cardiac & Pulmonary Rehab    Staff Present Cora Collum, RN, BSN, CCRP;Noah Tickle, BS, Exercise Physiologist;Joseph [place], [place]    Virtual Visit No    Medication changes reported     No    Fall or balance concerns reported    No    Warm-up and Cool-down Performed on first and last piece of equipment    Resistance Training Performed Yes    VAD Patient? No    PAD/SET Patient? No      Pain Assessment   Currently in Pain? No/denies                Social History   Tobacco Use  Smoking Status Former   Current packs/day: 0.00   Average packs/day: 3.0 packs/day for 42.0 years (126.0 ttl pk-yrs)   Types: Cigarettes   Start date: 14   Quit date: 1999   Years since quitting: 25.6  Smokeless Tobacco Never    Goals Met:  Proper associated with RPD/PD & O2 Sat Independence with exercise equipment Exercise tolerated well No report of concerns or symptoms today  Goals Unmet:  Not Applicable  Comments: Pt able to follow exercise prescription today without complaint.  Will continue to monitor for progression.    Dr. Bethann Punches is Medical Director for [facility] Cardiac Rehabilitation.  Dr. Vida Rigger is Medical Director for [facility] Pulmonary Rehabilitation.

## 2023-01-29 ENCOUNTER — Ambulatory Visit: Payer: Medicare Other | Admitting: Podiatry

## 2023-01-29 ENCOUNTER — Encounter: Payer: Self-pay | Admitting: Podiatry

## 2023-01-29 DIAGNOSIS — M79675 Pain in left toe(s): Secondary | ICD-10-CM

## 2023-01-29 DIAGNOSIS — B351 Tinea unguium: Secondary | ICD-10-CM

## 2023-01-29 DIAGNOSIS — E119 Type 2 diabetes mellitus without complications: Secondary | ICD-10-CM

## 2023-01-29 DIAGNOSIS — Z794 Long term (current) use of insulin: Secondary | ICD-10-CM

## 2023-01-29 DIAGNOSIS — M79674 Pain in right toe(s): Secondary | ICD-10-CM

## 2023-01-29 DIAGNOSIS — L84 Corns and callosities: Secondary | ICD-10-CM | POA: Diagnosis not present

## 2023-01-29 NOTE — Progress Notes (Signed)
  Subjective:  Patient ID: Glen Jenkins, male    DOB: 30-Dec-1940,  MRN: [id number]  Glen Jenkins presents to clinic today for: preventative diabetic foot care and callus(es) of both feet and painful thick toenails that are difficult to trim. Painful toenails interfere with ambulation. Aggravating factors include wearing enclosed shoe gear. Pain is relieved with periodic professional debridement. Painful calluses are aggravated when weightbearing with and without shoegear. Pain is relieved with periodic professional debridement.  Chief Complaint  Patient presents with   Nail Problem    DFC,Referring Provider Marguarite Arbour, MD   PCP,lov:07/24,A1C:7.1,BS:86      PCP is Marguarite Arbour, MD.  Allergies  Allergen Reactions   Sulfa Antibiotics     Review of Systems: Negative except as noted in the HPI.  Objective: No changes noted in today's physical examination. There were no vitals filed for this visit.  Glen Jenkins is a pleasant 82 y.o. male in NAD. AAO x 3.  Vascular Examination: Capillary refill time <3 seconds b/l LE. Palpable pedal pulses b/l LE. Skin temperature gradient WNL b/l. No varicosities b/l. Trace edema noted BLE.Marland Kitchen  Dermatological Examination: Pedal skin with normal turgor, texture and tone b/l. No open wounds. No interdigital macerations b/l. Toenails 2-5 b/l thickened, discolored, dystrophic with subungual debris. There is pain on palpation to dorsal aspect of nailplates. Incurvated nailplate medial border right hallux and both borders of right hallux.  Nail border hypertrophy absent. There is tenderness to palpation. Sign(s) of infection: no clinical signs of infection noted on examination today.. Hyperkeratotic lesion(s) submet head 1 right foot and submet head 5 b/l.  No erythema, no edema, no drainage, no fluctuance..  Neurological Examination: Protective sensation intact with 10 gram monofilament b/l LE. Vibratory sensation intact b/l LE.    Musculoskeletal Examination: Muscle strength 5/5 to all lower extremity muscle groups bilaterally. Pes planus deformity noted bilateral LE. Patient ambulates independent of any assistive aids.  Assessment/Plan: 1. Pain due to onychomycosis of toenails of both feet   2. Callus   3. Type 2 diabetes mellitus without complication, with long-term current use of insulin (HCC)     -Consent given for treatment as described below: -Examined patient. -Patient to continue soft, supportive shoe gear daily. -Toenails were debrided in length and girth 2-5 bilaterally with sterile nail nippers and dremel without iatrogenic bleeding.  -No invasive procedure(s) performed. Offending nail border debrided and curretaged medial border left hallux and both borders of right hallux utilizing sterile nail nipper and currette. Borders cleansed with alcohol . No further treatment required by patient/caregiver. Call office if there are any concerns. -Callus(es) submet head 1 right foot and submet head 5 b/l pared utilizing sharp debridement with sterile blade without complication or incident. Total number debrided =3. -Patient/POA to call should there be question/concern in the interim.   Return in about 10 weeks (around 04/09/2023).  Freddie Breech, DPM

## 2023-02-04 ENCOUNTER — Encounter: Payer: Self-pay | Admitting: *Deleted

## 2023-02-04 ENCOUNTER — Encounter: Payer: Medicare Other | Attending: Pulmonary Disease | Admitting: *Deleted

## 2023-02-04 DIAGNOSIS — J449 Chronic obstructive pulmonary disease, unspecified: Secondary | ICD-10-CM

## 2023-02-04 DIAGNOSIS — Z5189 Encounter for other specified aftercare: Secondary | ICD-10-CM | POA: Insufficient documentation

## 2023-02-04 NOTE — Progress Notes (Signed)
Pulmonary Individual Treatment Plan  Patient Details  Name: FELIBERTO GEIGEL MRN: [id number] Date of Birth: 12-05-1940 Referring Provider:   Flowsheet Row Pulmonary Rehab from 11/27/2022 in [facility] Cardiac and Pulmonary Rehab  Referring Provider Dr. Vida Rigger       Initial Encounter Date:  Flowsheet Row Pulmonary Rehab from 11/27/2022 in [facility] Cardiac and Pulmonary Rehab  Date 11/27/22       Visit Diagnosis: Chronic obstructive pulmonary disease, unspecified COPD type (HCC)  Patient's Home Medications on Admission:  Current Outpatient Medications:    acetaminophen (TYLENOL) 500 MG tablet, Take by mouth., Disp: , Rfl:    allopurinol (ZYLOPRIM) 100 MG tablet, Take by mouth., Disp: , Rfl:    amiodarone (PACERONE) 200 MG tablet, Take 0.5 tablets by mouth daily., Disp: , Rfl:    apixaban (ELIQUIS) 5 MG TABS tablet, Take by mouth., Disp: , Rfl:    Ascorbic Acid (VITAMIN C) 1000 MG tablet, Take 1,000 mg by mouth daily. (Patient not taking: Reported on 11/19/2022), Disp: , Rfl:    b complex vitamins capsule, Take 1 capsule by mouth daily. (Patient not taking: Reported on 12/06/2020), Disp: , Rfl:    budesonide (PULMICORT) 0.5 MG/2ML nebulizer solution, Inhale into the lungs., Disp: , Rfl:    Cholecalciferol (VITAMIN D-3) 125 MCG (5000 UT) TABS, Take by mouth daily. (Patient not taking: Reported on 12/06/2020), Disp: , Rfl:    colchicine 0.6 MG tablet, , Disp: , Rfl:    CONTOUR NEXT TEST test strip, , Disp: , Rfl:    docusate calcium (SURFAK) 240 MG capsule, Take 240 mg by mouth daily. (Patient not taking: Reported on 01/29/2023), Disp: , Rfl:    docusate calcium (SURFAK) 240 MG capsule, Take by mouth., Disp: , Rfl:    ELIQUIS 5 MG TABS tablet, Take 5 mg by mouth 2 (two) times daily., Disp: , Rfl:    furosemide (LASIX) 40 MG tablet, Take 1 tablet by mouth 2 (two) times daily., Disp: , Rfl:    glipiZIDE (GLUCOTROL XL) 10 MG 24 hr tablet, Take 10 mg by mouth daily with breakfast., Disp: , Rfl:     ipratropium-albuterol (DUONEB) 0.5-2.5 (3) MG/3ML SOLN, Inhale into the lungs., Disp: , Rfl:    isosorbide mononitrate (IMDUR) 30 MG 24 hr tablet, Take 30 mg by mouth daily., Disp: , Rfl:    lisinopril (ZESTRIL) 40 MG tablet, Take 1 tablet by mouth daily., Disp: , Rfl:    metFORMIN (GLUCOPHAGE) 1000 MG tablet, Take 1,000 mg by mouth 2 (two) times daily with a meal. (Patient not taking: Reported on 05/05/2022), Disp: , Rfl:    Microlet Lancets MISC, once daily Use as instructed., Disp: , Rfl:    Multiple Vitamin (ANTIOXIDANT A/C/E PO), Take 1 tablet by mouth daily. (Patient not taking: Reported on 12/06/2020), Disp: , Rfl:    Multiple Vitamins-Minerals (ICAPS AREDS 2 PO), Take by mouth. (Patient not taking: Reported on 11/19/2022), Disp: , Rfl:    simvastatin (ZOCOR) 40 MG tablet, Take 1 tablet by mouth at bedtime., Disp: , Rfl:   Past Medical History: Past Medical History:  Diagnosis Date   AAA (abdominal aortic aneurysm) (HCC)    Anginal pain (HCC)    Arthritis    lower back   Cancer (HCC)    Lung Cancer; Squamous Cell Carcinoma   Cardiomyopathy (HCC)    Coronary artery disease    CTS (carpal tunnel syndrome)    right - numbness thumb and 1st and 2nd fingers   Diabetes mellitus without  complication (HCC)    Dysrhythmia    Atrial Fibrillation   GERD (gastroesophageal reflux disease)    Hypercholesterolemia    Hypertension    Nephropathy, diabetic (HCC)    Onychomycosis    Renal insufficiency    Sleep apnea    Varicose veins    Wears dentures    partial upper   Wears hearing aid in both ears     Tobacco Use: Social History   Tobacco Use  Smoking Status Former   Current packs/day: 0.00   Average packs/day: 3.0 packs/day for 42.0 years (126.0 ttl pk-yrs)   Types: Cigarettes   Start date: 42   Quit date: 1999   Years since quitting: 25.6  Smokeless Tobacco Never    Labs: Review Flowsheet        No data to display           Pulmonary Assessment Scores:   Pulmonary Assessment Scores     Row Name 11/27/22 1204         ADL UCSD   ADL Phase Entry     SOB Score total 26     Rest 0     Walk 2     Stairs 3     Bath 0     Dress 1     Shop 1       CAT Score   CAT Score 18       mMRC Score   mMRC Score 2              UCSD: Self-administered rating of dyspnea associated with activities of daily living (ADLs) 6-point scale (0 = "not at all" to 5 = "maximal or unable to do because of breathlessness")  Scoring Scores range from 0 to 120.  Minimally important difference is 5 units  CAT: CAT can identify the health impairment of COPD patients and is better correlated with disease progression.  CAT has a scoring range of zero to 40. The CAT score is classified into four groups of low (less than 10), medium (10 - 20), high (21-30) and very high (31-40) based on the impact level of disease on health status. A CAT score over 10 suggests significant symptoms.  A worsening CAT score could be explained by an exacerbation, poor medication adherence, poor inhaler technique, or progression of COPD or comorbid conditions.  CAT MCID is 2 points  mMRC: mMRC (Modified Medical Research Council) Dyspnea Scale is used to assess the degree of baseline functional disability in patients of respiratory disease due to dyspnea. No minimal important difference is established. A decrease in score of 1 point or greater is considered a positive change.   Pulmonary Function Assessment:  Pulmonary Function Assessment - 11/19/22 1348       Breath   Shortness of Breath Yes;Limiting activity             Exercise Target Goals: Exercise Program Goal: Individual exercise prescription set using results from initial 6 min walk test and THRR while considering  patient's activity barriers and safety.   Exercise Prescription Goal: Initial exercise prescription builds to 30-45 minutes a day of aerobic activity, 2-3 days per week.  Home exercise guidelines will be  given to patient during program as part of exercise prescription that the participant will acknowledge.  Education: Aerobic Exercise: - Group verbal and visual presentation on the components of exercise prescription. Introduces F.I.T.T principle from ACSM for exercise prescriptions.  Reviews F.I.T.T. principles of aerobic exercise including progression. Written  material given at graduation.   Education: Resistance Exercise: - Group verbal and visual presentation on the components of exercise prescription. Introduces F.I.T.T principle from ACSM for exercise prescriptions  Reviews F.I.T.T. principles of resistance exercise including progression. Written material given at graduation.    Education: Exercise & Equipment Safety: - Individual verbal instruction and demonstration of equipment use and safety with use of the equipment. Flowsheet Row Pulmonary Rehab from 12/31/2022 in [facility] Cardiac and Pulmonary Rehab  Date 11/19/22  Educator [facility]  Instruction Review Code 1- Verbalizes Understanding       Education: Exercise Physiology & General Exercise Guidelines: - Group verbal and written instruction with models to review the exercise physiology of the cardiovascular system and associated critical values. Provides general exercise guidelines with specific guidelines to those with heart or lung disease.    Education: Flexibility, Balance, Mind/Body Relaxation: - Group verbal and visual presentation with interactive activity on the components of exercise prescription. Introduces F.I.T.T principle from ACSM for exercise prescriptions. Reviews F.I.T.T. principles of flexibility and balance exercise training including progression. Also discusses the mind body connection.  Reviews various relaxation techniques to help reduce and manage stress (i.e. Deep breathing, progressive muscle relaxation, and visualization). Balance handout provided to take home. Written material given at graduation.   Activity  Barriers & Risk Stratification:  Activity Barriers & Cardiac Risk Stratification - 11/27/22 1346       Activity Barriers & Cardiac Risk Stratification   Activity Barriers Neck/Spine Problems;Back Problems             6 Minute Walk:  6 Minute Walk     Row Name 11/27/22 1341         6 Minute Walk   Phase Initial     Distance 900 feet     Walk Time 6 minutes     # of Rest Breaks 0     MPH 1.7     METS 1.26     RPE 13     Perceived Dyspnea  3     VO2 Peak 4.39     Symptoms Yes (comment)     Comments SOB     Resting HR 53 bpm     Resting BP 138/56     Resting Oxygen Saturation  95 %     Exercise Oxygen Saturation  during 6 min walk 91 %     Max Ex. HR 110 bpm     Max Ex. BP 158/62     2 Minute Post BP 134/58       Interval HR   1 Minute HR 68     2 Minute HR 79     3 Minute HR 86     4 Minute HR 110     5 Minute HR 106     6 Minute HR 98     2 Minute Post HR 66     Interval Heart Rate? Yes       Interval Oxygen   Interval Oxygen? Yes     Baseline Oxygen Saturation % 95 %     1 Minute Oxygen Saturation % 95 %     1 Minute Liters of Oxygen 0 L  RA     2 Minute Oxygen Saturation % 95 %     2 Minute Liters of Oxygen 0 L  RA     3 Minute Oxygen Saturation % 97 %     3 Minute Liters of Oxygen 0 L  RA  4 Minute Oxygen Saturation % 95 %     4 Minute Liters of Oxygen 0 L  RA     5 Minute Oxygen Saturation % 94 %     5 Minute Liters of Oxygen 0 L  RA     6 Minute Oxygen Saturation % 91 %     6 Minute Liters of Oxygen 0 L  RA     2 Minute Post Oxygen Saturation % 98 %     2 Minute Post Liters of Oxygen 0 L  RA             Oxygen Initial Assessment:  Oxygen Initial Assessment - 11/19/22 1347       Home Oxygen   Home Oxygen Device None    Sleep Oxygen Prescription None    Home Exercise Oxygen Prescription None    Home Resting Oxygen Prescription None      Initial 6 min Walk   Oxygen Used None      Program Oxygen Prescription   Program Oxygen  Prescription None      Intervention   Short Term Goals To learn and understand importance of monitoring SPO2 with pulse oximeter and demonstrate accurate use of the pulse oximeter.;To learn and demonstrate proper pursed lip breathing techniques or other breathing techniques. ;To learn and understand importance of maintaining oxygen saturations>88%;To learn and demonstrate proper use of respiratory medications;To learn and exhibit compliance with exercise, home and travel O2 prescription    Long  Term Goals Exhibits compliance with exercise, home  and travel O2 prescription;Verbalizes importance of monitoring SPO2 with pulse oximeter and return demonstration;Exhibits proper breathing techniques, such as pursed lip breathing or other method taught during program session;Demonstrates proper use of MDI's;Compliance with respiratory medication;Maintenance of O2 saturations>88%             Oxygen Re-Evaluation:  Oxygen Re-Evaluation     Row Name 12/17/22 0944             Program Oxygen Prescription   Program Oxygen Prescription None         Home Oxygen   Home Oxygen Device None       Sleep Oxygen Prescription None       Home Exercise Oxygen Prescription None       Home Resting Oxygen Prescription None         Goals/Expected Outcomes   Short Term Goals To learn and demonstrate proper pursed lip breathing techniques or other breathing techniques.        Long  Term Goals Exhibits proper breathing techniques, such as pursed lip breathing or other method taught during program session       Comments Informed patient how to perform the Pursed Lipped breathing technique. Told patient to Inhale through the nose and out the mouth with pursed lips to keep their airways open, help oxygenate them better, practice when at rest or doing strenuous activity. Patient Verbalizes understanding of technique and will work on and be reiterated during [facility].       Goals/Expected Outcomes Short: use PLB with  exertion. Long: use PLB on exertion proficiently and independently.                Oxygen Discharge (Final Oxygen Re-Evaluation):  Oxygen Re-Evaluation - 12/17/22 0944       Program Oxygen Prescription   Program Oxygen Prescription None      Home Oxygen   Home Oxygen Device None    Sleep Oxygen Prescription None  Home Exercise Oxygen Prescription None    Home Resting Oxygen Prescription None      Goals/Expected Outcomes   Short Term Goals To learn and demonstrate proper pursed lip breathing techniques or other breathing techniques.     Long  Term Goals Exhibits proper breathing techniques, such as pursed lip breathing or other method taught during program session    Comments Informed patient how to perform the Pursed Lipped breathing technique. Told patient to Inhale through the nose and out the mouth with pursed lips to keep their airways open, help oxygenate them better, practice when at rest or doing strenuous activity. Patient Verbalizes understanding of technique and will work on and be reiterated during [facility].    Goals/Expected Outcomes Short: use PLB with exertion. Long: use PLB on exertion proficiently and independently.             Initial Exercise Prescription:  Initial Exercise Prescription - 11/27/22 1300       Date of Initial Exercise RX and Referring Provider   Date 11/27/22    Referring Provider Dr. Vida Rigger      Oxygen   Maintain Oxygen Saturation 88% or higher      Treadmill   MPH 1.4    Grade 0    Minutes 15    METs 2.07      NuStep   Level 2    SPM 80    Minutes 15    METs 1.26      Biostep-RELP   Level 1    SPM 50    Minutes 15    METs 1.26      Prescription Details   Frequency (times per week) 2    Duration Progress to 30 minutes of continuous aerobic without signs/symptoms of physical distress      Intensity   THRR 40-80% of Max Heartrate 86-120    Ratings of Perceived Exertion 11-13    Perceived Dyspnea 0-4       Progression   Progression Continue to progress workloads to maintain intensity without signs/symptoms of physical distress.      Resistance Training   Training Prescription Yes    Weight 4 lb    Reps 10-15             Perform Capillary Blood Glucose checks as needed.  Exercise Prescription Changes:   Exercise Prescription Changes     Row Name 11/27/22 1300 12/15/22 0800 12/30/22 1000 01/13/23 1500 01/27/23 1400     Response to Exercise   Blood Pressure (Admit) 138/56 120/50 148/60 104/58 118/50   Blood Pressure (Exercise) 158/62 162/66 166/62 148/62 158/66   Blood Pressure (Exit) 134/58 118/62 108/58 104/56 116/54   Heart Rate (Admit) 53 bpm 52 bpm 62 bpm 57 bpm 78 bpm   Heart Rate (Exercise) 110 bpm 109 bpm 101 bpm 91 bpm 96 bpm   Heart Rate (Exit) 66 bpm 64 bpm 92 bpm 66 bpm 76 bpm   Oxygen Saturation (Admit) 95 % 95 % 96 % 93 % 97 %   Oxygen Saturation (Exercise) 91 % 94 % 91 % 93 % 93 %   Oxygen Saturation (Exit) 98 % 97 % 98 % 97 % 96 %   Rating of Perceived Exertion (Exercise) 13 15 14 14 14    Perceived Dyspnea (Exercise) 3 3 2  0 1   Symptoms SOB SOB SOB SOB SOB   Comments Results First two weeks of exercise -- -- --   Duration -- Progress to 30  minutes of  aerobic without signs/symptoms of physical distress Progress to 30 minutes of  aerobic without signs/symptoms of physical distress Progress to 30 minutes of  aerobic without signs/symptoms of physical distress Progress to 30 minutes of  aerobic without signs/symptoms of physical distress   Intensity -- THRR unchanged THRR unchanged THRR unchanged THRR unchanged     Progression   Progression -- Continue to progress workloads to maintain intensity without signs/symptoms of physical distress. Continue to progress workloads to maintain intensity without signs/symptoms of physical distress. Continue to progress workloads to maintain intensity without signs/symptoms of physical distress. Continue to progress workloads  to maintain intensity without signs/symptoms of physical distress.   Average METs -- 2.61 2.35 2.16 2.1     Resistance Training   Training Prescription -- Yes Yes Yes Yes   Weight -- 4 lb 4 lb 5 5   Reps -- 10-15 10-15 10-15 10-15     Interval Training   Interval Training -- No No No No     Oxygen   Oxygen -- -- -- Continuous --     Treadmill   MPH -- 1.4 1.6 1.6 1.5   Grade -- 0 0 0 1   Minutes -- 15 15 15 15    METs -- 2.07 2.23 2.23 2.35     Recumbant Bike   Level -- 4 3 -- 3   Watts -- 19 19 -- 20   Minutes -- 15 15 -- 15   METs -- 2.47 2.47 -- 2.5     NuStep   Level -- 5 4 3  --   Minutes -- 15 15 15  --   METs -- 4.1 2.5 2.3 --     T5 Nustep   Level -- -- -- 2  T6 3  T6 - level 1   Minutes -- -- -- 15 15   METs -- -- -- 1.9 2  T6 - 1.8 METs     Oxygen   Maintain Oxygen Saturation -- 88% or higher 88% or higher 88% or higher 88% or higher            Exercise Comments:   Exercise Comments     Row Name 12/03/22 1610           Exercise Comments First full day of exercise!  Patient was oriented to gym and equipment including functions, settings, policies, and procedures.  Patient's individual exercise prescription and treatment plan were reviewed.  All starting workloads were established based on the results of the 6 minute walk test done at initial orientation visit.  The plan for exercise progression was also introduced and progression will be customized based on patient's performance and goals.                Exercise Goals and Review:   Exercise Goals     Row Name 11/27/22 1247             Exercise Goals   Increase Physical Activity Yes       Intervention Provide advice, education, support and counseling about physical activity/exercise needs.;Develop an individualized exercise prescription for aerobic and resistive training based on initial evaluation findings, risk stratification, comorbidities and participant's personal goals.        Expected Outcomes Short Term: Attend rehab on a regular basis to increase amount of physical activity.;Long Term: Add in home exercise to make exercise part of routine and to increase amount of physical activity.;Long Term: Exercising regularly at least 3-5 days a week.  Increase Strength and Stamina Yes       Intervention Provide advice, education, support and counseling about physical activity/exercise needs.;Develop an individualized exercise prescription for aerobic and resistive training based on initial evaluation findings, risk stratification, comorbidities and participant's personal goals.       Expected Outcomes Short Term: Increase workloads from initial exercise prescription for resistance, speed, and METs.;Long Term: Improve cardiorespiratory fitness, muscular endurance and strength as measured by increased METs and functional capacity ( );Short Term: Perform resistance training exercises routinely during rehab and add in resistance training at home       Able to understand and use rate of perceived exertion (RPE) scale Yes       Intervention Provide education and explanation on how to use RPE scale       Expected Outcomes Short Term: Able to use RPE daily in rehab to express subjective intensity level;Long Term:  Able to use RPE to guide intensity level when exercising independently       Able to understand and use Dyspnea scale Yes       Intervention Provide education and explanation on how to use Dyspnea scale       Expected Outcomes Short Term: Able to use Dyspnea scale daily in rehab to express subjective sense of shortness of breath during exertion;Long Term: Able to use Dyspnea scale to guide intensity level when exercising independently       Knowledge and understanding of Target Heart Rate Range (THRR) Yes       Intervention Provide education and explanation of THRR including how the numbers were predicted and where they are located for reference       Expected Outcomes Short  Term: Able to state/look up THRR;Long Term: Able to use THRR to govern intensity when exercising independently;Short Term: Able to use daily as guideline for intensity in rehab       Able to check pulse independently Yes       Intervention Provide education and demonstration on how to check pulse in carotid and radial arteries.;Review the importance of being able to check your own pulse for safety during independent exercise       Expected Outcomes Short Term: Able to explain why pulse checking is important during independent exercise;Long Term: Able to check pulse independently and accurately       Understanding of Exercise Prescription Yes       Intervention Provide education, explanation, and written materials on patient's individual exercise prescription       Expected Outcomes Short Term: Able to explain program exercise prescription;Long Term: Able to explain home exercise prescription to exercise independently                Exercise Goals Re-Evaluation :  Exercise Goals Re-Evaluation     Row Name 12/03/22 0929 12/15/22 1610 12/30/22 1037 01/13/23 1542 01/27/23 1456     Exercise Goal Re-Evaluation   Exercise Goals Review Able to understand and use rate of perceived exertion (RPE) scale;Able to understand and use Dyspnea scale;Knowledge and understanding of Target Heart Rate Range (THRR);Understanding of Exercise Prescription Increase Physical Activity;Increase Strength and Stamina;Understanding of Exercise Prescription Increase Physical Activity;Increase Strength and Stamina;Understanding of Exercise Prescription Increase Physical Activity;Increase Strength and Stamina;Understanding of Exercise Prescription Increase Physical Activity;Increase Strength and Stamina;Understanding of Exercise Prescription   Comments Reviewed RPE  and dyspnea scale, THR and program prescription with pt today.  Pt voiced understanding and was given a copy of goals to take home. Sage is off to a  good start in  the program. He has done well with the treadmill at a speed of 1.4 mph with no incline. He also was able to improve to level 4 on the recumbent bike andlevel 5 on the T4 nustep. We will continue to monitor his progress. Zohaib is doing well in rehab. He increased his speed on the treadmill from 1.4 mph to 1.6 mph with no incline. He also has maintained consistent workloads on the T4 nustep and recumbent bike. We will continue to monitor his progress in the program. Khamryn continues to do well in rehab. He recently increased his hand weights from 4 to 5lbs. He also used the new T6 on level 2, which was recently added to our gym. We will continue to monitor his progress in the program. Kendyn continues to do well in rehab. He recently increased his grade on the treadmill from 0 to 1%. He has been consistent since last review on his current exercise prescription. We will continue to monitor his progress in the program.   Expected Outcomes Short: Use RPE daily to regulate intensity. Long: Follow program prescription in THR. Short: Continue to follow current exercise prescription. Long: Continue to increased MET level and stamina. Short: Try level 5 on the T4 nustep. Long: Continue exercise to improve strength and stamina. Short: Try level 5 on the T4 nustep, try 0.5% incline on the treadmill. Long: Continue exercise to improve strength and stamina. Short: Try level 5 on the T4 nustep. Long: Continue exercise to improve strength and stamina.            Discharge Exercise Prescription (Final Exercise Prescription Changes):  Exercise Prescription Changes - 01/27/23 1400       Response to Exercise   Blood Pressure (Admit) 118/50    Blood Pressure (Exercise) 158/66    Blood Pressure (Exit) 116/54    Heart Rate (Admit) 78 bpm    Heart Rate (Exercise) 96 bpm    Heart Rate (Exit) 76 bpm    Oxygen Saturation (Admit) 97 %    Oxygen Saturation (Exercise) 93 %    Oxygen Saturation (Exit) 96 %    Rating of Perceived  Exertion (Exercise) 14    Perceived Dyspnea (Exercise) 1    Symptoms SOB    Duration Progress to 30 minutes of  aerobic without signs/symptoms of physical distress    Intensity THRR unchanged      Progression   Progression Continue to progress workloads to maintain intensity without signs/symptoms of physical distress.    Average METs 2.1      Resistance Training   Training Prescription Yes    Weight 5    Reps 10-15      Interval Training   Interval Training No      Treadmill   MPH 1.5    Grade 1    Minutes 15    METs 2.35      Recumbant Bike   Level 3    Watts 20    Minutes 15    METs 2.5      T5 Nustep   Level 3   T6 - level 1   Minutes 15    METs 2   T6 - 1.8 METs     Oxygen   Maintain Oxygen Saturation 88% or higher             Nutrition:  Target Goals: Understanding of nutrition guidelines, daily intake of sodium 1500mg , cholesterol 200mg , calories 30% from fat and 7% or  less from saturated fats, daily to have 5 or more servings of fruits and vegetables.  Education: [facility] Nutrition: -Group instruction provided by verbal, written material, interactive activities, discussions, models, and posters to present general guidelines for heart healthy nutrition including fat, fiber, MyPlate, the role of sodium in heart healthy nutrition, utilization of the nutrition label, and utilization of this knowledge for meal planning. Follow up email sent as well. Written material given at graduation. Flowsheet Row Pulmonary Rehab from 12/31/2022 in [facility] Cardiac and Pulmonary Rehab  Education need identified 11/27/22       Biometrics:  Pre Biometrics - 11/27/22 1346       Pre Biometrics   Height 6\' 1"  (1.854 m)    Weight 279 lb 9.6 oz (126.8 kg)    Waist Circumference 50 inches    Hip Circumference 53 inches    Waist to Hip Ratio 0.94 %    BMI (Calculated) 36.9    Single Leg Stand 0 seconds              Nutrition Therapy Plan and Nutrition Goals:   Nutrition Therapy & Goals - 11/27/22 1131       Nutrition Therapy   Diet Mediterranean    Protein (specify units) 90    Fiber 30 grams    Whole Grain Foods 3 servings    Saturated Fats 15 max. grams    Fruits and Vegetables 5 servings/day    Sodium 1.5 grams      Personal Nutrition Goals   Nutrition Goal Eat a protein at every meal (use protein shake if needed)    Personal Goal #2 Small frequent meals    Personal Goal #3 Try lentils as plant based protein    Comments He reports he doesn't eat breakfast, has a large lunch, small dinner. He says dinner is all veggies. Talked tp him about adding protein. Reviewed primer protein shake, low calorie (he is trying to lose weight) but RD still wants to ensure he has adequate protein intake. He also likes salmon. He is not sure if he has tried lentils, encouraged him to try them. His wife reads labels and watches for sodium and fat content. He eats out a lot, wants to work on eating out less. Reviewed mediterranean diet handout, focusing on eating small frequent meals, protein at every meal.      Intervention Plan   Intervention Prescribe, educate and counsel regarding individualized specific dietary modifications aiming towards targeted core components such as weight, hypertension, lipid management, diabetes, heart failure and other comorbidities.;Nutrition handout(s) given to patient.    Expected Outcomes Short Term Goal: Understand basic principles of dietary content, such as calories, fat, sodium, cholesterol and nutrients.;Short Term Goal: A plan has been developed with personal nutrition goals set during dietitian appointment.;Long Term Goal: Adherence to prescribed nutrition plan.             Nutrition Assessments:  MEDIFICTS Score Key: ?70 Need to make dietary changes  40-70 Heart Healthy Diet ? 40 Therapeutic Level Cholesterol Diet  Flowsheet Row Pulmonary Rehab from 11/27/2022 in [facility] Cardiac and Pulmonary Rehab  Picture Your  Plate Total Score on Admission 52      Picture Your Plate Scores: <32 Unhealthy dietary pattern with much room for improvement. 41-50 Dietary pattern unlikely to meet recommendations for good health and room for improvement. 51-60 More healthful dietary pattern, with some room for improvement.  >60 Healthy dietary pattern, although there may be some specific behaviors that could  be improved.   Nutrition Goals Re-Evaluation:  Nutrition Goals Re-Evaluation     Row Name 12/17/22 0950             Goals   Current Weight 280 lb (127 kg)       Comment Patient was informed on why it is important to maintain a balanced diet when dealing with Respiratory issues. Explained that it takes a lot of energy to breath and when they are short of breath often they will need to have a good diet to help keep up with the calories they are expending for breathing.       Expected Outcome Short: Choose and plan snacks accordingly to patients caloric intake to improve breathing. Long: Maintain a diet independently that meets their caloric intake to aid in daily shortness of breath.                Nutrition Goals Discharge (Final Nutrition Goals Re-Evaluation):  Nutrition Goals Re-Evaluation - 12/17/22 0950       Goals   Current Weight 280 lb (127 kg)    Comment Patient was informed on why it is important to maintain a balanced diet when dealing with Respiratory issues. Explained that it takes a lot of energy to breath and when they are short of breath often they will need to have a good diet to help keep up with the calories they are expending for breathing.    Expected Outcome Short: Choose and plan snacks accordingly to patients caloric intake to improve breathing. Long: Maintain a diet independently that meets their caloric intake to aid in daily shortness of breath.             Psychosocial: Target Goals: Acknowledge presence or absence of significant depression and/or stress, maximize coping  skills, provide positive support system. Participant is able to verbalize types and ability to use techniques and skills needed for reducing stress and depression.   Education: Stress, Anxiety, and Depression - Group verbal and visual presentation to define topics covered.  Reviews how body is impacted by stress, anxiety, and depression.  Also discusses healthy ways to reduce stress and to treat/manage anxiety and depression.  Written material given at graduation. Flowsheet Row Pulmonary Rehab from 12/31/2022 in [facility] Cardiac and Pulmonary Rehab  Date 12/31/22  Educator SB  Instruction Review Code 1- [employer] Understanding       Education: Sleep Hygiene -Provides group verbal and written instruction about how sleep can affect your health.  Define sleep hygiene, discuss sleep cycles and impact of sleep habits. Review good sleep hygiene tips.    Initial Review & Psychosocial Screening:  Initial Psych Review & Screening - 11/19/22 1349       Initial Review   Current issues with None Identified      Family Dynamics   Good Support System? 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 He is retiring from an [employer]. He has good family support system. His wife and his faith are big supporters in his life.      Barriers   Psychosocial barriers to participate in program There are no identifiable barriers or psychosocial needs.;The patient should benefit from training in stress management and relaxation.      Screening Interventions   Interventions To provide support and resources with identified psychosocial needs;Provide feedback about the scores to participant;Encouraged to exercise    Expected Outcomes Short Term goal: Utilizing psychosocial counselor, staff  and physician to assist with identification of specific Stressors or current issues interfering with healing process. Setting desired goal for each stressor or current issue  identified.;Long Term Goal: Stressors or current issues are controlled or eliminated.;Short Term goal: Identification and review with participant of any Quality of Life or Depression concerns found by scoring the questionnaire.;Long Term goal: The participant improves quality of Life and PHQ9 Scores as seen by post scores and/or verbalization of changes             Quality of Life Scores:  Scores of 19 and below usually indicate a poorer quality of life in these areas.  A difference of  2-3 points is a clinically meaningful difference.  A difference of 2-3 points in the total score of the Quality of Life Index has been associated with significant improvement in overall quality of life, self-image, physical symptoms, and general health in studies assessing change in quality of life.  PHQ-9: Review Flowsheet       12/17/2022 11/27/2022 12/06/2020  Depression screen PHQ 2/9  Decreased Interest 0 1 0  Down, Depressed, Hopeless 0 1 1  PHQ - 2 Score 0 2 1  Altered sleeping 0 1 -  Tired, decreased energy 2 1 -  Change in appetite 0 0 -  Feeling bad or failure about yourself  0 1 -  Trouble concentrating 0 0 -  Moving slowly or fidgety/restless 0 0 -  Suicidal thoughts 0 0 -  PHQ-9 Score 2 5 -  Difficult doing work/chores Not difficult at all Not difficult at all -    Details           Interpretation of Total Score  Total Score Depression Severity:  1-4 = Minimal depression, 5-9 = Mild depression, 10-14 = Moderate depression, 15-19 = Moderately severe depression, 20-27 = Severe depression   Psychosocial Evaluation and Intervention:  Psychosocial Evaluation - 11/19/22 1353       Psychosocial Evaluation & Interventions   Interventions Encouraged to exercise with the program and follow exercise prescription;Relaxation education;Stress management education    Comments 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 He is retiring from an [employer]. He has  good family support system. His wife and his faith are big supporters in his life.    Expected Outcomes Short: Start [facility] to help with mood. Long: Maintain a healthy mental state.    Continue Psychosocial Services  Follow up required by staff             Psychosocial Re-Evaluation:  Psychosocial Re-Evaluation     Row Name 12/17/22 [id number]             Psychosocial Re-Evaluation   Current issues with None Identified       Comments Reviewed patient health questionnaire (PHQ-9) with patient for follow up. Previously, patients score indicated signs/symptoms of depression.  Reviewed to see if patient is improving symptom wise while in program.  Score improved and patient states that it is because he has been able to gain some strength.       Expected Outcomes Short: Continue to attend [facility] regularly for regular exercise and social engagement. Long: Continue to improve symptoms and manage a positive mental state.       Interventions Encouraged to attend Pulmonary Rehabilitation for the exercise       Continue Psychosocial Services  Follow up required by staff  Psychosocial Discharge (Final Psychosocial Re-Evaluation):  Psychosocial Re-Evaluation - 12/17/22 0952       Psychosocial Re-Evaluation   Current issues with None Identified    Comments Reviewed patient health questionnaire (PHQ-9) with patient for follow up. Previously, patients score indicated signs/symptoms of depression.  Reviewed to see if patient is improving symptom wise while in program.  Score improved and patient states that it is because he has been able to gain some strength.    Expected Outcomes Short: Continue to attend [facility] regularly for regular exercise and social engagement. Long: Continue to improve symptoms and manage a positive mental state.    Interventions Encouraged to attend Pulmonary Rehabilitation for the exercise    Continue Psychosocial Services  Follow up required by staff              Education: Education Goals: Education classes will be provided on a weekly basis, covering required topics. Participant will state understanding/return demonstration of topics presented.  Learning Barriers/Preferences:  Learning Barriers/Preferences - 11/19/22 1348       Learning Barriers/Preferences   Learning Barriers None    Learning Preferences None             General Pulmonary Education Topics:  Infection Prevention: - Provides verbal and written material to individual with discussion of infection control including proper hand washing and proper equipment cleaning during exercise session. Flowsheet Row Pulmonary Rehab from 12/31/2022 in [facility] Cardiac and Pulmonary Rehab  Date 11/19/22  Educator [facility]  Instruction Review Code 1- Verbalizes Understanding       Falls Prevention: - Provides verbal and written material to individual with discussion of falls prevention and safety. Flowsheet Row Pulmonary Rehab from 12/31/2022 in [facility] Cardiac and Pulmonary Rehab  Date 11/19/22  Educator [facility]  Instruction Review Code 1- Verbalizes Understanding       Chronic Lung Disease Review: - Group verbal instruction with posters, models, PowerPoint presentations and videos,  to review new updates, new respiratory medications, new advancements in procedures and treatments. Providing information on websites and "800" numbers for continued self-education. Includes information about supplement oxygen, available portable oxygen systems, continuous and intermittent flow rates, oxygen safety, concentrators, and Medicare reimbursement for oxygen. Explanation of Pulmonary Drugs, including class, frequency, complications, importance of spacers, rinsing mouth after steroid MDI's, and proper cleaning methods for nebulizers. Review of basic lung anatomy and physiology related to function, structure, and complications of lung disease. Review of risk factors. Discussion about methods for  diagnosing sleep apnea and types of masks and machines for OSA. Includes a review of the use of types of environmental controls: home humidity, furnaces, filters, dust mite/pet prevention, HEPA vacuums. Discussion about weather changes, air quality and the benefits of nasal washing. Instruction on Warning signs, infection symptoms, calling MD promptly, preventive modes, and value of vaccinations. Review of effective airway clearance, coughing and/or vibration techniques. Emphasizing that all should Create an Action Plan. Written material given at graduation. Flowsheet Row Pulmonary Rehab from 12/31/2022 in [facility] Cardiac and Pulmonary Rehab  Education need identified 11/27/22       AED/CPR: - Group verbal and written instruction with the use of models to demonstrate the basic use of the AED with the basic ABC's of resuscitation.    Anatomy and Cardiac Procedures: - Group verbal and visual presentation and models provide information about basic cardiac anatomy and function. Reviews the testing methods done to diagnose heart disease and the outcomes of the test results. Describes the treatment choices: Medical Management, Angioplasty, or  Coronary Bypass Surgery for treating various heart conditions including Myocardial Infarction, Angina, Valve Disease, and Cardiac Arrhythmias.  Written material given at graduation.   Medication Safety: - Group verbal and visual instruction to review commonly prescribed medications for heart and lung disease. Reviews the medication, class of the drug, and side effects. Includes the steps to properly store meds and maintain the prescription regimen.  Written material given at graduation. Flowsheet Row Pulmonary Rehab from 12/31/2022 in [facility] Cardiac and Pulmonary Rehab  Education need identified 11/27/22       Other: -Provides group and verbal instruction on various topics (see comments)   Knowledge Questionnaire Score:  Knowledge Questionnaire Score - 11/27/22  1203       Knowledge Questionnaire Score   Pre Score 12/18              Core Components/Risk Factors/Patient Goals at Admission:  Personal Goals and Risk Factors at Admission - 11/19/22 1348       Core Components/Risk Factors/Patient Goals on Admission    Weight Management Yes;Weight Loss    Intervention Weight Management: Develop a combined nutrition and exercise program designed to reach desired caloric intake, while maintaining appropriate intake of nutrient and fiber, sodium and fats, and appropriate energy expenditure required for the weight goal.;Weight Management: Provide education and appropriate resources to help participant work on and attain dietary goals.;Weight Management/Obesity: Establish reasonable short term and long term weight goals.;Obesity: Provide education and appropriate resources to help participant work on and attain dietary goals.    Expected Outcomes Short Term: Continue to assess and modify interventions until short term weight is achieved;Long Term: Adherence to nutrition and physical activity/exercise program aimed toward attainment of established weight goal;Weight Loss: Understanding of general recommendations for a balanced deficit meal plan, which promotes 1-2 lb weight loss per week and includes a negative energy balance of [phone] kcal/d;Understanding recommendations for meals to include 15-35% energy as protein, 25-35% energy from fat, 35-60% energy from carbohydrates, less than 200mg  of dietary cholesterol, 20-35 gm of total fiber daily;Understanding of distribution of calorie intake throughout the day with the consumption of 4-5 meals/snacks    Improve shortness of breath with ADL's Yes    Intervention Provide education, individualized exercise plan and daily activity instruction to help decrease symptoms of SOB with activities of daily living.    Expected Outcomes Short Term: Improve cardiorespiratory fitness to achieve a reduction of symptoms when  performing ADLs;Long Term: Be able to perform more ADLs without symptoms or delay the onset of symptoms    Diabetes Yes    Intervention Provide education about signs/symptoms and action to take for hypo/hyperglycemia.;Provide education about proper nutrition, including hydration, and aerobic/resistive exercise prescription along with prescribed medications to achieve blood glucose in normal ranges: Fasting glucose 65-99 mg/dL    Expected Outcomes Short Term: Participant verbalizes understanding of the signs/symptoms and immediate care of hyper/hypoglycemia, proper foot care and importance of medication, aerobic/resistive exercise and nutrition plan for blood glucose control.;Long Term: Attainment of HbA1C < 7%.    Heart Failure Yes    Intervention Provide a combined exercise and nutrition program that is supplemented with education, support and counseling about heart failure. Directed toward relieving symptoms such as shortness of breath, decreased exercise tolerance, and extremity edema.    Expected Outcomes Improve functional capacity of life;Short term: Attendance in program 2-3 days a week with increased exercise capacity. Reported lower sodium intake. Reported increased fruit and vegetable intake. Reports medication compliance.;Short term: Daily weights obtained and  reported for increase. Utilizing diuretic protocols set by physician.;Long term: Adoption of self-care skills and reduction of barriers for early signs and symptoms recognition and intervention leading to self-care maintenance.    Hypertension Yes    Intervention Provide education on lifestyle modifcations including regular physical activity/exercise, weight management, moderate sodium restriction and increased consumption of fresh fruit, vegetables, and low fat dairy, alcohol moderation, and smoking cessation.;Monitor prescription use compliance.    Expected Outcomes Short Term: Continued assessment and intervention until BP is < 140/63mm  HG in hypertensive participants. < 130/62mm HG in hypertensive participants with diabetes, heart failure or chronic kidney disease.;Long Term: Maintenance of blood pressure at goal levels.    Lipids Yes    Intervention Provide education and support for participant on nutrition & aerobic/resistive exercise along with prescribed medications to achieve LDL 70mg , HDL >40mg .    Expected Outcomes Short Term: Participant states understanding of desired cholesterol values and is compliant with medications prescribed. Participant is following exercise prescription and nutrition guidelines.;Long Term: Cholesterol controlled with medications as prescribed, with individualized exercise RX and with personalized nutrition plan. Value goals: LDL < 70mg , HDL > 40 mg.             Education:Diabetes - Individual verbal and written instruction to review signs/symptoms of diabetes, desired ranges of glucose level fasting, after meals and with exercise. Acknowledge that pre and post exercise glucose checks will be done for 3 sessions at entry of program. Flowsheet Row Pulmonary Rehab from 12/31/2022 in [facility] Cardiac and Pulmonary Rehab  Date 11/19/22  Educator [facility]  Instruction Review Code 1- Verbalizes Understanding       Know Your Numbers and Heart Failure: - Group verbal and visual instruction to discuss disease risk factors for cardiac and pulmonary disease and treatment options.  Reviews associated critical values for Overweight/Obesity, Hypertension, Cholesterol, and Diabetes.  Discusses basics of heart failure: signs/symptoms and treatments.  Introduces Heart Failure Zone chart for action plan for heart failure.  Written material given at graduation. Flowsheet Row Pulmonary Rehab from 12/31/2022 in [facility] Cardiac and Pulmonary Rehab  Education need identified 11/27/22       Core Components/Risk Factors/Patient Goals Review:   Goals and Risk Factor Review     Row Name 12/17/22 0945             Core  Components/Risk Factors/Patient Goals Review   Personal Goals Review Improve shortness of breath with ADL's       Review Spoke to patient about their shortness of breath and what they can do to improve. Patient has been informed of breathing techniques when starting the program. Patient is informed to tell staff if they have had any med changes and that certain meds they are taking or not taking can be causing shortness of breath.       Expected Outcomes Short: Attend [facility] regularly to improve shortness of breath with ADL's. Long: maintain independence with ADL's                Core Components/Risk Factors/Patient Goals at Discharge (Final Review):   Goals and Risk Factor Review - 12/17/22 0945       Core Components/Risk Factors/Patient Goals Review   Personal Goals Review Improve shortness of breath with ADL's    Review Spoke to patient about their shortness of breath and what they can do to improve. Patient has been informed of breathing techniques when starting the program. Patient is informed to tell staff if they have had any med  changes and that certain meds they are taking or not taking can be causing shortness of breath.    Expected Outcomes Short: Attend [facility] regularly to improve shortness of breath with ADL's. Long: maintain independence with ADL's             ITP Comments:  ITP Comments     Row Name 11/19/22 1347 11/27/22 1202 12/03/22 0929 12/09/22 1407 01/07/23 1127   ITP Comments Virtual Visit completed. Patient informed on EP and RD appointment and 6 Minute walk test. Patient also informed of patient health questionnaires on My Chart. Patient Verbalizes understanding. Visit diagnosis can be found in [facility] 08/06/[facility]22. Completed and gym orientation. Initial ITP created and sent for review to Dr. Vida Rigger, Medical Director. First full day of exercise!  Patient was oriented to gym and equipment including functions, settings, policies, and procedures.   Patient's individual exercise prescription and treatment plan were reviewed.  All starting workloads were established based on the results of the 6 minute walk test done at initial orientation visit.  The plan for exercise progression was also introduced and progression will be customized based on patient's performance and goals. 30 Day review completed. Medical Director ITP review done, changes made as directed, and signed approval by Medical Director.   new to program 30 Day review completed. Medical Director ITP review done, changes made as directed, and signed approval by Medical Director.    Row Name 02/04/23 0942           ITP Comments 30 Day review completed. Medical Director ITP review done, changes made as directed, and signed approval by Medical Director.                Comments:

## 2023-02-04 NOTE — Progress Notes (Signed)
Daily Session Note  Patient Details  Name: Glen Jenkins MRN: [id number] Date of Birth: [birth year] Referring Provider:   Flowsheet Row Pulmonary Rehab from 11/27/2022 in [facility]  Referring Provider Dr. Vida Rigger       Encounter Date: 02/04/2023  Check In:  Session Check In - 02/04/23 1033       Check-In   Supervising physician immediately available to respond to emergencies See telemetry face sheet for immediately available ER MD    Location [facility]-Cardiac & Pulmonary Rehab    Staff Present Ronette Deter, BS, Exercise Physiologist;Meredith Jewel Baize, RN BSN;Maxon Conetta BS, , Exercise Physiologist;Fisher Hargadon Katrinka Blazing, RN, ADN    Virtual Visit No    Medication changes reported     No    Fall or balance concerns reported    No    Warm-up and Cool-down Performed on first and last piece of equipment    Resistance Training Performed Yes    VAD Patient? No    PAD/SET Patient? No      Pain Assessment   Currently in Pain? No/denies                Social History   Tobacco Use  Smoking Status Former   Current packs/day: 0.00   Average packs/day: 3.0 packs/day for 42.0 years (126.0 ttl pk-yrs)   Types: Cigarettes   Start date: 18   Quit date: 1999   Years since quitting: 25.6  Smokeless Tobacco Never    Goals Met:  Independence with exercise equipment Exercise tolerated well No report of concerns or symptoms today Strength training completed today  Goals Unmet:  Not Applicable  Comments: Pt able to follow exercise prescription today without complaint.  Will continue to monitor for progression.    Dr. Bethann Punches is Medical Director for [facility] Card[facility] Rehabilitation.  Dr. Vida Rigger is Medical Director for [facility] Pulmonary Rehabilitation.

## 2023-02-11 ENCOUNTER — Encounter: Payer: Medicare Other | Admitting: *Deleted

## 2023-02-18 ENCOUNTER — Encounter: Payer: Self-pay | Admitting: *Deleted

## 2023-02-18 ENCOUNTER — Telehealth: Payer: Self-pay | Admitting: *Deleted

## 2023-02-18 ENCOUNTER — Encounter: Payer: Medicare Other | Admitting: *Deleted

## 2023-02-18 DIAGNOSIS — J449 Chronic obstructive pulmonary disease, unspecified: Secondary | ICD-10-CM

## 2023-02-18 NOTE — Progress Notes (Signed)
Pulmonary Individual Treatment Plan  Patient Details  Name: Glen Jenkins MRN: [id number] Date of Birth: [birth year] Referring Provider:   Flowsheet Row Pulmonary Rehab from 11/27/2022 in [facility]  Referring Provider Dr. Vida Rigger       Initial Encounter Date:  Flowsheet Row Pulmonary Rehab from 11/27/2022 in [facility] Cardiac and Pulmonary Rehab  Date 11/27/22       Visit Diagnosis: Chronic obstructive pulmonary disease, unspecified COPD type (HCC)  Patient's Home Medications on Admission:  Current Outpatient Medications:    acetaminophen (TYLENOL) 500 MG tablet, Take by mouth., Disp: , Rfl:    allopurinol (ZYLOPRIM) 100 MG tablet, Take by mouth., Disp: , Rfl:    amiodarone (PACERONE) 200 MG tablet, Take 0.5 tablets by mouth daily., Disp: , Rfl:    apixaban (ELIQUIS) 5 MG TABS tablet, Take by mouth., Disp: , Rfl:    Ascorbic Acid (VITAMIN C) 1000 MG tablet, Take 1,000 mg by mouth daily. (Patient not taking: Reported on 11/19/2022), Disp: , Rfl:    b complex vitamins capsule, Take 1 capsule by mouth daily. (Patient not taking: Reported on 12/06/2020), Disp: , Rfl:    budesonide (PULMICORT) 0.5 MG/2ML nebulizer solution, Inhale into the lungs., Disp: , Rfl:    Cholecalciferol (VITAMIN D-3) 125 MCG (5000 UT) TABS, Take by mouth daily. (Patient not taking: Reported on 12/06/2020), Disp: , Rfl:    colchicine 0.6 MG tablet, , Disp: , Rfl:    CONTOUR NEXT TEST test strip, , Disp: , Rfl:    docusate calcium (SURFAK) 240 MG capsule, Take 240 mg by mouth daily. (Patient not taking: Reported on 01/29/2023), Disp: , Rfl:    docusate calcium (SURFAK) 240 MG capsule, Take by mouth., Disp: , Rfl:    ELIQUIS 5 MG TABS tablet, Take 5 mg by mouth 2 (two) times daily., Disp: , Rfl:    furosemide (LASIX) 40 MG tablet, Take 1 tablet by mouth 2 (two) times daily., Disp: , Rfl:    glipiZIDE (GLUCOTROL XL) 10 MG 24 hr tablet, Take 10 mg by mouth daily with breakfast., Disp: , Rfl:     ipratropium-albuterol (DUONEB) 0.5-2.5 (3) MG/3ML SOLN, Inhale into the lungs., Disp: , Rfl:    isosorbide mononitrate (IMDUR) 30 MG 24 hr tablet, Take 30 mg by mouth daily., Disp: , Rfl:    lisinopril (ZESTRIL) 40 MG tablet, Take 1 tablet by mouth daily., Disp: , Rfl:    metFORMIN (GLUCOPHAGE) 1000 MG tablet, Take 1,000 mg by mouth 2 (two) times daily with a meal. (Patient not taking: Reported on 05/05/2022), Disp: , Rfl:    Microlet Lancets MISC, once daily Use as instructed., Disp: , Rfl:    Multiple Vitamin (ANTIOXIDANT A/C/E PO), Take 1 tablet by mouth daily. (Patient not taking: Reported on 12/06/2020), Disp: , Rfl:    Multiple Vitamins-Minerals (ICAPS AREDS 2 PO), Take by mouth. (Patient not taking: Reported on 11/19/2022), Disp: , Rfl:    simvastatin (ZOCOR) 40 MG tablet, Take 1 tablet by mouth at bedtime., Disp: , Rfl:   Past Medical History: Past Medical History:  Diagnosis Date   AAA (abdominal aortic aneurysm) (HCC)    Anginal pain (HCC)    Arthritis    lower back   Cancer (HCC)    Lung Cancer; Squamous Cell Carcinoma   Cardiomyopathy (HCC)    Coronary artery disease    CTS (carpal tunnel syndrome)    right - numbness thumb and 1st and 2nd fingers   Diabetes mellitus without  complication (HCC)    Dysrhythmia    Atrial Fibrillation   GERD (gastroesophageal reflux disease)    Hypercholesterolemia    Hypertension    Nephropathy, diabetic (HCC)    Onychomycosis    Renal insufficiency    Sleep apnea    Varicose veins    Wears dentures    partial upper   Wears hearing aid in both ears     Tobacco Use: Social History   Tobacco Use  Smoking Status Former   Current packs/day: 0.00   Average packs/day: 3.0 packs/day for 42.0 years (126.0 ttl pk-yrs)   Types: Cigarettes   Start date: 29   Quit date: 1999   Years since quitting: 25.7  Smokeless Tobacco Never    Labs: Review Flowsheet        No data to display           Pulmonary Assessment Scores:   Pulmonary Assessment Scores     Row Name 11/27/22 1204         ADL UCSD   ADL Phase Entry     SOB Score total 26     Rest 0     Walk 2     Stairs 3     Bath 0     Dress 1     Shop 1       CAT Score   CAT Score 18       mMRC Score   mMRC Score 2              UCSD: Self-administered rating of dyspnea associated with activities of daily living (ADLs) 6-point scale (0 = "not at all" to 5 = "maximal or unable to do because of breathlessness")  Scoring Scores range from 0 to 120.  Minimally important difference is 5 units  CAT: CAT can identify the health impairment of COPD patients and is better correlated with disease progression.  CAT has a scoring range of zero to 40. The CAT score is classified into four groups of low (less than 10), medium (10 - 20), high (21-30) and very high (31-40) based on the impact level of disease on health status. A CAT score over 10 suggests significant symptoms.  A worsening CAT score could be explained by an exacerbation, poor medication adherence, poor inhaler technique, or progression of COPD or comorbid conditions.  CAT MCID is 2 points  mMRC: mMRC (Modified Medical Research Council) Dyspnea Scale is used to assess the degree of baseline functional disability in patients of respiratory disease due to dyspnea. No minimal important difference is established. A decrease in score of 1 point or greater is considered a positive change.   Pulmonary Function Assessment:  Pulmonary Function Assessment - 11/19/22 1348       Breath   Shortness of Breath Yes;Limiting activity             Exercise Target Goals: Exercise Program Goal: Individual exercise prescription set using results from initial 6 min walk test and THRR while considering  patient's activity barriers and safety.   Exercise Prescription Goal: Initial exercise prescription builds to 30-45 minutes a day of aerobic activity, 2-3 days per week.  Home exercise guidelines will be  given to patient during program as part of exercise prescription that the participant will acknowledge.  Education: Aerobic Exercise: - Group verbal and visual presentation on the components of exercise prescription. Introduces F.I.T.T principle from ACSM for exercise prescriptions.  Reviews F.I.T.T. principles of aerobic exercise including progression. Written  material given at graduation.   Education: Resistance Exercise: - Group verbal and visual presentation on the components of exercise prescription. Introduces F.I.T.T principle from ACSM for exercise prescriptions  Reviews F.I.T.T. principles of resistance exercise including progression. Written material given at graduation.    Education: Exercise & Equipment Safety: - Individual verbal instruction and demonstration of equipment use and safety with use of the equipment. Flowsheet Row Pulmonary Rehab from 12/31/2022 in [facility] Cardiac and Pulmonary Rehab  Date 11/19/22  Educator [facility]  Instruction Review Code 1- Verbalizes Understanding       Education: Exercise Physiology & General Exercise Guidelines: - Group verbal and written instruction with models to review the exercise physiology of the cardiovascular system and associated critical values. Provides general exercise guidelines with specific guidelines to those with heart or lung disease.    Education: Flexibility, Balance, Mind/Body Relaxation: - Group verbal and visual presentation with interactive activity on the components of exercise prescription. Introduces F.I.T.T principle from ACSM for exercise prescriptions. Reviews F.I.T.T. principles of flexibility and balance exercise training including progression. Also discusses the mind body connection.  Reviews various relaxation techniques to help reduce and manage stress (i.e. Deep breathing, progressive muscle relaxation, and visualization). Balance handout provided to take home. Written material given at graduation.   Activity  Barriers & Risk Stratification:  Activity Barriers & Cardiac Risk Stratification - 11/27/22 1346       Activity Barriers & Cardiac Risk Stratification   Activity Barriers Neck/Spine Problems;Back Problems             6 Minute Walk:  6 Minute Walk     Row Name 11/27/22 1341         6 Minute Walk   Phase Initial     Distance 900 feet     Walk Time 6 minutes     # of Rest Breaks 0     MPH 1.7     METS 1.26     RPE 13     Perceived Dyspnea  3     VO2 Peak 4.39     Symptoms Yes (comment)     Comments SOB     Resting HR 53 bpm     Resting BP 138/56     Resting Oxygen Saturation  95 %     Exercise Oxygen Saturation  during 6 min walk 91 %     Max Ex. HR 110 bpm     Max Ex. BP 158/62     2 Minute Post BP 134/58       Interval HR   1 Minute HR 68     2 Minute HR 79     3 Minute HR 86     4 Minute HR 110     5 Minute HR 106     6 Minute HR 98     2 Minute Post HR 66     Interval Heart Rate? Yes       Interval Oxygen   Interval Oxygen? Yes     Baseline Oxygen Saturation % 95 %     1 Minute Oxygen Saturation % 95 %     1 Minute Liters of Oxygen 0 L  RA     2 Minute Oxygen Saturation % 95 %     2 Minute Liters of Oxygen 0 L  RA     3 Minute Oxygen Saturation % 97 %     3 Minute Liters of Oxygen 0 L  RA  4 Minute Oxygen Saturation % 95 %     4 Minute Liters of Oxygen 0 L  RA     5 Minute Oxygen Saturation % 94 %     5 Minute Liters of Oxygen 0 L  RA     6 Minute Oxygen Saturation % 91 %     6 Minute Liters of Oxygen 0 L  RA     2 Minute Post Oxygen Saturation % 98 %     2 Minute Post Liters of Oxygen 0 L  RA             Oxygen Initial Assessment:  Oxygen Initial Assessment - 11/19/22 1347       Home Oxygen   Home Oxygen Device None    Sleep Oxygen Prescription None    Home Exercise Oxygen Prescription None    Home Resting Oxygen Prescription None      Initial 6 min Walk   Oxygen Used None      Program Oxygen Prescription   Program Oxygen  Prescription None      Intervention   Short Term Goals To learn and understand importance of monitoring SPO2 with pulse oximeter and demonstrate accurate use of the pulse oximeter.;To learn and demonstrate proper pursed lip breathing techniques or other breathing techniques. ;To learn and understand importance of maintaining oxygen saturations>88%;To learn and demonstrate proper use of respiratory medications;To learn and exhibit compliance with exercise, home and travel O2 prescription    Long  Term Goals Exhibits compliance with exercise, home  and travel O2 prescription;Verbalizes importance of monitoring SPO2 with pulse oximeter and return demonstration;Exhibits proper breathing techniques, such as pursed lip breathing or other method taught during program session;Demonstrates proper use of MDI's;Compliance with respiratory medication;Maintenance of O2 saturations>88%             Oxygen Re-Evaluation:  Oxygen Re-Evaluation     Row Name 12/17/22 0944             Program Oxygen Prescription   Program Oxygen Prescription None         Home Oxygen   Home Oxygen Device None       Sleep Oxygen Prescription None       Home Exercise Oxygen Prescription None       Home Resting Oxygen Prescription None         Goals/Expected Outcomes   Short Term Goals To learn and demonstrate proper pursed lip breathing techniques or other breathing techniques.        Long  Term Goals Exhibits proper breathing techniques, such as pursed lip breathing or other method taught during program session       Comments Informed patient how to perform the Pursed Lipped breathing technique. Told patient to Inhale through the nose and out the mouth with pursed lips to keep their airways open, help oxygenate them better, practice when at rest or doing strenuous activity. Patient Verbalizes understanding of technique and will work on and be reiterated during [facility].       Goals/Expected Outcomes Short: use PLB with  exertion. Long: use PLB on exertion proficiently and independently.                Oxygen Discharge (Final Oxygen Re-Evaluation):  Oxygen Re-Evaluation - 12/17/22 0944       Program Oxygen Prescription   Program Oxygen Prescription None      Home Oxygen   Home Oxygen Device None    Sleep Oxygen Prescription None  Home Exercise Oxygen Prescription None    Home Resting Oxygen Prescription None      Goals/Expected Outcomes   Short Term Goals To learn and demonstrate proper pursed lip breathing techniques or other breathing techniques.     Long  Term Goals Exhibits proper breathing techniques, such as pursed lip breathing or other method taught during program session    Comments Informed patient how to perform the Pursed Lipped breathing technique. Told patient to Inhale through the nose and out the mouth with pursed lips to keep their airways open, help oxygenate them better, practice when at rest or doing strenuous activity. Patient Verbalizes understanding of technique and will work on and be reiterated during [facility].    Goals/Expected Outcomes Short: use PLB with exertion. Long: use PLB on exertion proficiently and independently.             Initial Exercise Prescription:  Initial Exercise Prescription - 11/27/22 1300       Date of Initial Exercise RX and Referring Provider   Date 11/27/22    Referring Provider Dr. Vida Rigger      Oxygen   Maintain Oxygen Saturation 88% or higher      Treadmill   MPH 1.4    Grade 0    Minutes 15    METs 2.07      NuStep   Level 2    SPM 80    Minutes 15    METs 1.26      Biostep-RELP   Level 1    SPM 50    Minutes 15    METs 1.26      Prescription Details   Frequency (times per week) 2    Duration Progress to 30 minutes of continuous aerobic without signs/symptoms of physical distress      Intensity   THRR 40-80% of Max Heartrate 86-120    Ratings of Perceived Exertion 11-13    Perceived Dyspnea 0-4       Progression   Progression Continue to progress workloads to maintain intensity without signs/symptoms of physical distress.      Resistance Training   Training Prescription Yes    Weight 4 lb    Reps 10-15             Perform Capillary Blood Glucose checks as needed.  Exercise Prescription Changes:   Exercise Prescription Changes     Row Name 11/27/22 1300 12/15/22 0800 12/30/22 1000 01/13/23 1500 01/27/23 1400     Response to Exercise   Blood Pressure (Admit) 138/56 120/50 148/60 104/58 118/50   Blood Pressure (Exercise) 158/62 162/66 166/62 148/62 158/66   Blood Pressure (Exit) 134/58 118/62 108/58 104/56 116/54   Heart Rate (Admit) 53 bpm 52 bpm 62 bpm 57 bpm 78 bpm   Heart Rate (Exercise) 110 bpm 109 bpm 101 bpm 91 bpm 96 bpm   Heart Rate (Exit) 66 bpm 64 bpm 92 bpm 66 bpm 76 bpm   Oxygen Saturation (Admit) 95 % 95 % 96 % 93 % 97 %   Oxygen Saturation (Exercise) 91 % 94 % 91 % 93 % 93 %   Oxygen Saturation (Exit) 98 % 97 % 98 % 97 % 96 %   Rating of Perceived Exertion (Exercise) 13 15 14 14 14    Perceived Dyspnea (Exercise) 3 3 2  0 1   Symptoms SOB SOB SOB SOB SOB   Comments Results First two weeks of exercise -- -- --   Duration -- Progress to 30  minutes of  aerobic without signs/symptoms of physical distress Progress to 30 minutes of  aerobic without signs/symptoms of physical distress Progress to 30 minutes of  aerobic without signs/symptoms of physical distress Progress to 30 minutes of  aerobic without signs/symptoms of physical distress   Intensity -- THRR unchanged THRR unchanged THRR unchanged THRR unchanged     Progression   Progression -- Continue to progress workloads to maintain intensity without signs/symptoms of physical distress. Continue to progress workloads to maintain intensity without signs/symptoms of physical distress. Continue to progress workloads to maintain intensity without signs/symptoms of physical distress. Continue to progress workloads  to maintain intensity without signs/symptoms of physical distress.   Average METs -- 2.61 2.35 2.16 2.1     Resistance Training   Training Prescription -- Yes Yes Yes Yes   Weight -- 4 lb 4 lb 5 5   Reps -- 10-15 10-15 10-15 10-15     Interval Training   Interval Training -- No No No No     Oxygen   Oxygen -- -- -- Continuous --     Treadmill   MPH -- 1.4 1.6 1.6 1.5   Grade -- 0 0 0 1   Minutes -- 15 15 15 15    METs -- 2.07 2.23 2.23 2.35     Recumbant Bike   Level -- 4 3 -- 3   Watts -- 19 19 -- 20   Minutes -- 15 15 -- 15   METs -- 2.47 2.47 -- 2.5     NuStep   Level -- 5 4 3  --   Minutes -- 15 15 15  --   METs -- 4.1 2.5 2.3 --     T5 Nustep   Level -- -- -- 2  T6 3  T6 - level 1   Minutes -- -- -- 15 15   METs -- -- -- 1.9 2  T6 - 1.8 METs     Oxygen   Maintain Oxygen Saturation -- 88% or higher 88% or higher 88% or higher 88% or higher    Row Name 02/11/23 0800             Response to Exercise   Blood Pressure (Admit) 120/60       Blood Pressure (Exit) 126/58       Heart Rate (Admit) 56 bpm       Heart Rate (Exercise) 65 bpm       Heart Rate (Exit) 62 bpm       Oxygen Saturation (Admit) 95 %       Oxygen Saturation (Exercise) 97 %       Oxygen Saturation (Exit) 97 %       Rating of Perceived Exertion (Exercise) 12       Symptoms SOB       Duration Progress to 30 minutes of  aerobic without signs/symptoms of physical distress       Intensity THRR unchanged         Progression   Progression Continue to progress workloads to maintain intensity without signs/symptoms of physical distress.       Average METs 1         Resistance Training   Training Prescription Yes       Weight 5 lb       Reps 10-15         Interval Training   Interval Training No         Arm Ergometer   Level 2  Minutes 15       METs 1         Oxygen   Maintain Oxygen Saturation 88% or higher                Exercise Comments:   Exercise Comments     Row  Name 12/03/22 0929           Exercise Comments First full day of exercise!  Patient was oriented to gym and equipment including functions, settings, policies, and procedures.  Patient's individual exercise prescription and treatment plan were reviewed.  All starting workloads were established based on the results of the 6 minute walk test done at initial orientation visit.  The plan for exercise progression was also introduced and progression will be customized based on patient's performance and goals.                Exercise Goals and Review:   Exercise Goals     Row Name 11/27/22 1247             Exercise Goals   Increase Physical Activity Yes       Intervention Provide advice, education, support and counseling about physical activity/exercise needs.;Develop an individualized exercise prescription for aerobic and resistive training based on initial evaluation findings, risk stratification, comorbidities and participant's personal goals.       Expected Outcomes Short Term: Attend rehab on a regular basis to increase amount of physical activity.;Long Term: Add in home exercise to make exercise part of routine and to increase amount of physical activity.;Long Term: Exercising regularly at least 3-5 days a week.       Increase Strength and Stamina Yes       Intervention Provide advice, education, support and counseling about physical activity/exercise needs.;Develop an individualized exercise prescription for aerobic and resistive training based on initial evaluation findings, risk stratification, comorbidities and participant's personal goals.       Expected Outcomes Short Term: Increase workloads from initial exercise prescription for resistance, speed, and METs.;Long Term: Improve cardiorespiratory fitness, muscular endurance and strength as measured by increased METs and functional capacity ( );Short Term: Perform resistance training exercises routinely during rehab and add in  resistance training at home       Able to understand and use rate of perceived exertion (RPE) scale Yes       Intervention Provide education and explanation on how to use RPE scale       Expected Outcomes Short Term: Able to use RPE daily in rehab to express subjective intensity level;Long Term:  Able to use RPE to guide intensity level when exercising independently       Able to understand and use Dyspnea scale Yes       Intervention Provide education and explanation on how to use Dyspnea scale       Expected Outcomes Short Term: Able to use Dyspnea scale daily in rehab to express subjective sense of shortness of breath during exertion;Long Term: Able to use Dyspnea scale to guide intensity level when exercising independently       Knowledge and understanding of Target Heart Rate Range (THRR) Yes       Intervention Provide education and explanation of THRR including how the numbers were predicted and where they are located for reference       Expected Outcomes Short Term: Able to state/look up THRR;Long Term: Able to use THRR to govern intensity when exercising independently;Short Term: Able to use daily as guideline for intensity in  rehab       Able to check pulse independently Yes       Intervention Provide education and demonstration on how to check pulse in carotid and radial arteries.;Review the importance of being able to check your own pulse for safety during independent exercise       Expected Outcomes Short Term: Able to explain why pulse checking is important during independent exercise;Long Term: Able to check pulse independently and accurately       Understanding of Exercise Prescription Yes       Intervention Provide education, explanation, and written materials on patient's individual exercise prescription       Expected Outcomes Short Term: Able to explain program exercise prescription;Long Term: Able to explain home exercise prescription to exercise independently                 Exercise Goals Re-Evaluation :  Exercise Goals Re-Evaluation     Row Name 12/03/22 0929 12/15/22 4098 12/30/22 1037 01/13/23 1542 01/27/23 1456     Exercise Goal Re-Evaluation   Exercise Goals Review Able to understand and use rate of perceived exertion (RPE) scale;Able to understand and use Dyspnea scale;Knowledge and understanding of Target Heart Rate Range (THRR);Understanding of Exercise Prescription Increase Physical Activity;Increase Strength and Stamina;Understanding of Exercise Prescription Increase Physical Activity;Increase Strength and Stamina;Understanding of Exercise Prescription Increase Physical Activity;Increase Strength and Stamina;Understanding of Exercise Prescription Increase Physical Activity;Increase Strength and Stamina;Understanding of Exercise Prescription   Comments Reviewed RPE  and dyspnea scale, THR and program prescription with pt today.  Pt voiced understanding and was given a copy of goals to take home. Glen Jenkins is off to a good start in the program. He has done well with the treadmill at a speed of 1.4 mph with no incline. He also was able to improve to level 4 on the recumbent bike andlevel 5 on the T4 nustep. We will continue to monitor his progress. Glen Jenkins is doing well in rehab. He increased his speed on the treadmill from 1.4 mph to 1.6 mph with no incline. He also has maintained consistent workloads on the T4 nustep and recumbent bike. We will continue to monitor his progress in the program. Glen Jenkins continues to do well in rehab. He recently increased his hand weights from 4 to 5lbs. He also used the new T6 on level 2, which was recently added to our gym. We will continue to monitor his progress in the program. Glen Jenkins continues to do well in rehab. He recently increased his grade on the treadmill from 0 to 1%. He has been consistent since last review on his current exercise prescription. We will continue to monitor his progress in the program.   Expected Outcomes Short: Use  RPE daily to regulate intensity. Long: Follow program prescription in THR. Short: Continue to follow current exercise prescription. Long: Continue to increased MET level and stamina. Short: Try level 5 on the T4 nustep. Long: Continue exercise to improve strength and stamina. Short: Try level 5 on the T4 nustep, try 0.5% incline on the treadmill. Long: Continue exercise to improve strength and stamina. Short: Try level 5 on the T4 nustep. Long: Continue exercise to improve strength and stamina.    Row Name 02/11/23 0848             Exercise Goal Re-Evaluation   Exercise Goals Review Increase Physical Activity;Increase Strength and Stamina;Understanding of Exercise Prescription       Comments Glen Jenkins has only attended one rehab session since the last  review. During his one session he began using the arm crank and did well at level 2. He did also have to leave early due to feeling nauseaus. We will continue to monitor his progress when he returns to the program.       Expected Outcomes Short: Return to regular attendance in the program. Long: Continue exercise to improve strength and stamina.                Discharge Exercise Prescription (Final Exercise Prescription Changes):  Exercise Prescription Changes - 02/11/23 0800       Response to Exercise   Blood Pressure (Admit) 120/60    Blood Pressure (Exit) 126/58    Heart Rate (Admit) 56 bpm    Heart Rate (Exercise) 65 bpm    Heart Rate (Exit) 62 bpm    Oxygen Saturation (Admit) 95 %    Oxygen Saturation (Exercise) 97 %    Oxygen Saturation (Exit) 97 %    Rating of Perceived Exertion (Exercise) 12    Symptoms SOB    Duration Progress to 30 minutes of  aerobic without signs/symptoms of physical distress    Intensity THRR unchanged      Progression   Progression Continue to progress workloads to maintain intensity without signs/symptoms of physical distress.    Average METs 1      Resistance Training   Training Prescription Yes     Weight 5 lb    Reps 10-15      Interval Training   Interval Training No      Arm Ergometer   Level 2    Minutes 15    METs 1      Oxygen   Maintain Oxygen Saturation 88% or higher             Nutrition:  Target Goals: Understanding of nutrition guidelines, daily intake of sodium 1500mg , cholesterol 200mg , calories 30% from fat and 7% or less from saturated fats, daily to have 5 or more servings of fruits and vegetables.  Education: All About Nutrition: -Group instruction provided by verbal, written material, interactive activities, discussions, models, and posters to present general guidelines for heart healthy nutrition including fat, fiber, MyPlate, the role of sodium in heart healthy nutrition, utilization of the nutrition label, and utilization of this knowledge for meal planning. Follow up email sent as well. Written material given at graduation. Flowsheet Row Pulmonary Rehab from 12/31/2022 in [facility] Card[facility] and Pulmonary Rehab  Education need identified 11/27/22       Biometrics:  Pre Biometrics - 11/27/22 1346       Pre Biometrics   Height 6\' 1"  (1.854 m)    Weight 279 lb 9.6 oz (126.8 kg)    Waist Circumference 50 inches    Hip Circumference 53 inches    Waist to Hip Ratio 0.94 %    BMI (Calculated) 36.9    Single Leg Stand 0 seconds              Nutrition Therapy Plan and Nutrition Goals:  Nutrition Therapy & Goals - 11/27/22 1131       Nutrition Therapy   Diet Mediterranean    Protein (specify units) 90    Fiber 30 grams    Whole Grain Foods 3 servings    Saturated Fats 15 max. grams    Fruits and Vegetables 5 servings/day    Sodium 1.5 grams      Personal Nutrition Goals   Nutrition Goal Eat a protein at every  meal (use protein shake if needed)    Personal Goal #2 Small frequent meals    Personal Goal #3 Try lentils as plant based protein    Comments He reports he doesn't eat breakfast, has a large lunch, small dinner. He says dinner is  all veggies. Talked tp him about adding protein. Reviewed primer protein shake, low calorie (he is trying to lose weight) but RD still wants to ensure he has adequate protein intake. He also likes salmon. He is not sure if he has tried lentils, encouraged him to try them. His wife reads labels and watches for sodium and fat content. He eats out a lot, wants to work on eating out less. Reviewed mediterranean diet handout, focusing on eating small frequent meals, protein at every meal.      Intervention Plan   Intervention Prescribe, educate and counsel regarding individualized specific dietary modifications aiming towards targeted core components such as weight, hypertension, lipid management, diabetes, heart failure and other comorbidities.;Nutrition handout(s) given to patient.    Expected Outcomes Short Term Goal: Understand basic principles of dietary content, such as calories, fat, sodium, cholesterol and nutrients.;Short Term Goal: A plan has been developed with personal nutrition goals set during dietitian appointment.;Long Term Goal: Adherence to prescribed nutrition plan.             Nutrition Assessments:  MEDIFICTS Score Key: >=70 Need to make dietary changes  40-70 Heart Healthy Diet <= 40 Therapeutic Level Cholesterol Diet  Flowsheet Row Pulmonary Rehab from 11/27/2022 in [facility] Cardiac and Pulmonary Rehab  Picture Your Plate Total Score on Admission 52      Picture Your Plate Scores: <40 Unhealthy dietary pattern with much room for improvement. 41-50 Dietary pattern unlikely to meet recommendations for good health and room for improvement. 51-60 More healthful dietary pattern, with some room for improvement.  >60 Healthy dietary pattern, although there may be some specific behaviors that could be improved.   Nutrition Goals Re-Evaluation:  Nutrition Goals Re-Evaluation     Row Name 12/17/22 0950             Goals   Current Weight 280 lb (127 kg)       Comment  Patient was informed on why it is important to maintain a balanced diet when dealing with Respiratory issues. Explained that it takes a lot of energy to breath and when they are short of breath often they will need to have a good diet to help keep up with the calories they are expending for breathing.       Expected Outcome Short: Choose and plan snacks accordingly to patients caloric intake to improve breathing. Long: Maintain a diet independently that meets their caloric intake to aid in daily shortness of breath.                Nutrition Goals Discharge (Final Nutrition Goals Re-Evaluation):  Nutrition Goals Re-Evaluation - 12/17/22 0950       Goals   Current Weight 280 lb (127 kg)    Comment Patient was informed on why it is important to maintain a balanced diet when dealing with Respiratory issues. Explained that it takes a lot of energy to breath and when they are short of breath often they will need to have a good diet to help keep up with the calories they are expending for breathing.    Expected Outcome Short: Choose and plan snacks accordingly to patients caloric intake to improve breathing. Long: Maintain a diet independently  that meets their caloric intake to aid in daily shortness of breath.             Psychosocial: Target Goals: Acknowledge presence or absence of significant depression and/or stress, maximize coping skills, provide positive support system. Participant is able to verbalize types and ability to use techniques and skills needed for reducing stress and depression.   Education: Stress, Anxiety, and Depression - Group verbal and visual presentation to define topics covered.  Reviews how body is impacted by stress, anxiety, and depression.  Also discusses healthy ways to reduce stress and to treat/manage anxiety and depression.  Written material given at graduation. Flowsheet Row Pulmonary Rehab from 12/31/2022 in [facility] Cardiac and Pulmonary Rehab  Date 12/31/22   Educator SB  Instruction Review Code 1- [employer] Understanding       Education: Sleep Hygiene -Provides group verbal and written instruction about how sleep can affect your health.  Define sleep hygiene, discuss sleep cycles and impact of sleep habits. Review good sleep hygiene tips.    Initial Review & Psychosocial Screening:  Initial Psych Review & Screening - 11/19/22 1349       Initial Review   Current issues with None Identified      Family Dynamics   Good Support System? 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 He is retiring from an [employer]. He has good family support system. His wife and his faith are big supporters in his life.      Barriers   Psychosocial barriers to participate in program There are no identifiable barriers or psychosocial needs.;The patient should benefit from training in stress management and relaxation.      Screening Interventions   Interventions To provide support and resources with identified psychosocial needs;Provide feedback about the scores to participant;Encouraged to exercise    Expected Outcomes Short Term goal: Utilizing psychosocial counselor, staff and physician to assist with identification of specific Stressors or current issues interfering with healing process. Setting desired goal for each stressor or current issue identified.;Long Term Goal: Stressors or current issues are controlled or eliminated.;Short Term goal: Identification and review with participant of any Quality of Life or Depression concerns found by scoring the questionnaire.;Long Term goal: The participant improves quality of Life and PHQ9 Scores as seen by post scores and/or verbalization of changes             Quality of Life Scores:  Scores of 19 and below usually indicate a poorer quality of life in these areas.  A difference of  2-3 points is a clinically meaningful difference.  A difference of 2-3 points in  the total score of the Quality of Life Index has been associated with significant improvement in overall quality of life, self-image, physical symptoms, and general health in studies assessing change in quality of life.  PHQ-9: Review Flowsheet       12/17/2022 11/27/2022 12/06/2020  Depression screen PHQ 2/9  Decreased Interest 0 1 0  Down, Depressed, Hopeless 0 1 1  PHQ - 2 Score 0 2 1  Altered sleeping 0 1 -  Tired, decreased energy 2 1 -  Change in appetite 0 0 -  Feeling bad or failure about yourself  0 1 -  Trouble concentrating 0 0 -  Moving slowly or fidgety/restless 0 0 -  Suicidal thoughts 0 0 -  PHQ-9 Score 2 5 -  Difficult doing work/chores Not difficult at all Not difficult  at all -    Details           Interpretation of Total Score  Total Score Depression Severity:  1-4 = Minimal depression, 5-9 = Mild depression, 10-14 = Moderate depression, 15-19 = Moderately severe depression, 20-27 = Severe depression   Psychosocial Evaluation and Intervention:  Psychosocial Evaluation - 11/19/22 1353       Psychosocial Evaluation & Interventions   Interventions Encouraged to exercise with the program and follow exercise prescription;Relaxation education;Stress management education    Comments 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 He is retiring from an [employer]. He has good family support system. His wife and his faith are big supporters in his life.    Expected Outcomes Short: Start [facility] to help with mood. Long: Maintain a healthy mental state.    Continue Psychosocial Services  Follow up required by staff             Psychosocial Re-Evaluation:  Psychosocial Re-Evaluation     Row Name 12/17/22 [phone]             Psychosocial Re-Evaluation   Current issues with None Identified       Comments Reviewed patient health questionnaire (PHQ-9) with patient for follow up. Previously, patients score indicated signs/symptoms of  depression.  Reviewed to see if patient is improving symptom wise while in program.  Score improved and patient states that it is because he has been able to gain some strength.       Expected Outcomes Short: Continue to attend [facility] regularly for regular exercise and social engagement. Long: Continue to improve symptoms and manage a positive mental state.       Interventions Encouraged to attend Pulmonary Rehabilitation for the exercise       Continue Psychosocial Services  Follow up required by staff                Psychosocial Discharge (Final Psychosocial Re-Evaluation):  Psychosocial Re-Evaluation - 12/17/22 0952       Psychosocial Re-Evaluation   Current issues with None Identified    Comments Reviewed patient health questionnaire (PHQ-9) with patient for follow up. Previously, patients score indicated signs/symptoms of depression.  Reviewed to see if patient is improving symptom wise while in program.  Score improved and patient states that it is because he has been able to gain some strength.    Expected Outcomes Short: Continue to attend [facility] regularly for regular exercise and social engagement. Long: Continue to improve symptoms and manage a positive mental state.    Interventions Encouraged to attend Pulmonary Rehabilitation for the exercise    Continue Psychosocial Services  Follow up required by staff             Education: Education Goals: Education classes will be provided on a weekly basis, covering required topics. Participant will state understanding/return demonstration of topics presented.  Learning Barriers/Preferences:  Learning Barriers/Preferences - 11/19/22 1348       Learning Barriers/Preferences   Learning Barriers None    Learning Preferences None             General Pulmonary Education Topics:  Infection Prevention: - Provides verbal and written material to individual with discussion of infection control including proper hand  washing and proper equipment cleaning during exercise session. Flowsheet Row Pulmonary Rehab from 12/31/2022 in [facility] Cardiac and Pulmonary Rehab  Date 11/19/22  Educator [facility]  Instruction Review Code 1- Verbalizes Understanding  Falls Prevention: - Provides verbal and written material to individual with discussion of falls prevention and safety. Flowsheet Row Pulmonary Rehab from 12/31/2022 in [facility] Cardiac and Pulmonary Rehab  Date 11/19/22  Educator [facility]  Instruction Review Code 1- Verbalizes Understanding       Chronic Lung Disease Review: - Group verbal instruction with posters, models, PowerPoint presentations and videos,  to review new updates, new respiratory medications, new advancements in procedures and treatments. Providing information on websites and "800" numbers for continued self-education. Includes information about supplement oxygen, available portable oxygen systems, continuous and intermittent flow rates, oxygen safety, concentrators, and Medicare reimbursement for oxygen. Explanation of Pulmonary Drugs, including class, frequency, complications, importance of spacers, rinsing mouth after steroid MDI's, and proper cleaning methods for nebulizers. Review of basic lung anatomy and physiology related to function, structure, and complications of lung disease. Review of risk factors. Discussion about methods for diagnosing sleep apnea and types of masks and machines for OSA. Includes a review of the use of types of environmental controls: home humidity, furnaces, filters, dust mite/pet prevention, HEPA vacuums. Discussion about weather changes, air quality and the benefits of nasal washing. Instruction on Warning signs, infection symptoms, calling MD promptly, preventive modes, and value of vaccinations. Review of effective airway clearance, coughing and/or vibration techniques. Emphasizing that all should Create an Action Plan. Written material given at graduation. Flowsheet Row  Pulmonary Rehab from 12/31/2022 in [facility] Cardiac and Pulmonary Rehab  Education need identified 11/27/22       AED/CPR: - Group verbal and written instruction with the use of models to demonstrate the basic use of the AED with the basic ABC's of resuscitation.    Anatomy and Cardiac Procedures: - Group verbal and visual presentation and models provide information about basic cardiac anatomy and function. Reviews the testing methods done to diagnose heart disease and the outcomes of the test results. Describes the treatment choices: Medical Management, Angioplasty, or Coronary Bypass Surgery for treating various heart conditions including Myocardial Infarction, Angina, Valve Disease, and Cardiac Arrhythmias.  Written material given at graduation.   Medication Safety: - Group verbal and visual instruction to review commonly prescribed medications for heart and lung disease. Reviews the medication, class of the drug, and side effects. Includes the steps to properly store meds and maintain the prescription regimen.  Written material given at graduation. Flowsheet Row Pulmonary Rehab from 12/31/2022 in [facility] Cardiac and Pulmonary Rehab  Education need identified 11/27/22       Other: -Provides group and verbal instruction on various topics (see comments)   Knowledge Questionnaire Score:  Knowledge Questionnaire Score - 11/27/22 1203       Knowledge Questionnaire Score   Pre Score 12/18              Core Components/Risk Factors/Patient Goals at Admission:  Personal Goals and Risk Factors at Admission - 11/19/22 1348       Core Components/Risk Factors/Patient Goals on Admission    Weight Management Yes;Weight Loss    Intervention Weight Management: Develop a combined nutrition and exercise program designed to reach desired caloric intake, while maintaining appropriate intake of nutrient and fiber, sodium and fats, and appropriate energy expenditure required for the weight  goal.;Weight Management: Provide education and appropriate resources to help participant work on and attain dietary goals.;Weight Management/Obesity: Establish reasonable short term and long term weight goals.;Obesity: Provide education and appropriate resources to help participant work on and attain dietary goals.    Expected Outcomes Short Term: Continue to  assess and modify interventions until short term weight is achieved;Long Term: Adherence to nutrition and physical activity/exercise program aimed toward attainment of established weight goal;Weight Loss: Understanding of general recommendations for a balanced deficit meal plan, which promotes 1-2 lb weight loss per week and includes a negative energy balance of [phone] kcal/d;Understanding recommendations for meals to include 15-35% energy as protein, 25-35% energy from fat, 35-60% energy from carbohydrates, less than 200mg  of dietary cholesterol, 20-35 gm of total fiber daily;Understanding of distribution of calorie intake throughout the day with the consumption of 4-5 meals/snacks    Improve shortness of breath with ADL's Yes    Intervention Provide education, individualized exercise plan and daily activity instruction to help decrease symptoms of SOB with activities of daily living.    Expected Outcomes Short Term: Improve cardiorespiratory fitness to achieve a reduction of symptoms when performing ADLs;Long Term: Be able to perform more ADLs without symptoms or delay the onset of symptoms    Diabetes Yes    Intervention Provide education about signs/symptoms and action to take for hypo/hyperglycemia.;Provide education about proper nutrition, including hydration, and aerobic/resistive exercise prescription along with prescribed medications to achieve blood glucose in normal ranges: Fasting glucose 65-99 mg/dL    Expected Outcomes Short Term: Participant verbalizes understanding of the signs/symptoms and immediate care of hyper/hypoglycemia, proper  foot care and importance of medication, aerobic/resistive exercise and nutrition plan for blood glucose control.;Long Term: Attainment of HbA1C < 7%.    Heart Failure Yes    Intervention Provide a combined exercise and nutrition program that is supplemented with education, support and counseling about heart failure. Directed toward relieving symptoms such as shortness of breath, decreased exercise tolerance, and extremity edema.    Expected Outcomes Improve functional capacity of life;Short term: Attendance in program 2-3 days a week with increased exercise capacity. Reported lower sodium intake. Reported increased fruit and vegetable intake. Reports medication compliance.;Short term: Daily weights obtained and reported for increase. Utilizing diuretic protocols set by physician.;Long term: Adoption of self-care skills and reduction of barriers for early signs and symptoms recognition and intervention leading to self-care maintenance.    Hypertension Yes    Intervention Provide education on lifestyle modifcations including regular physical activity/exercise, weight management, moderate sodium restriction and increased consumption of fresh fruit, vegetables, and low fat dairy, alcohol moderation, and smoking cessation.;Monitor prescription use compliance.    Expected Outcomes Short Term: Continued assessment and intervention until BP is < 140/42mm HG in hypertensive participants. < 130/23mm HG in hypertensive participants with diabetes, heart failure or chronic kidney disease.;Long Term: Maintenance of blood pressure at goal levels.    Lipids Yes    Intervention Provide education and support for participant on nutrition & aerobic/resistive exercise along with prescribed medications to achieve LDL 70mg , HDL >40mg .    Expected Outcomes Short Term: Participant states understanding of desired cholesterol values and is compliant with medications prescribed. Participant is following exercise prescription and  nutrition guidelines.;Long Term: Cholesterol controlled with medications as prescribed, with individualized exercise RX and with personalized nutrition plan. Value goals: LDL < 70mg , HDL > 40 mg.             Education:Diabetes - Individual verbal and written instruction to review signs/symptoms of diabetes, desired ranges of glucose level fasting, after meals and with exercise. Acknowledge that pre and post exercise glucose checks will be done for 3 sessions at entry of program. Flowsheet Row Pulmonary Rehab from 12/31/2022 in [facility] Cardiac and Pulmonary Rehab  Date 11/19/22  Educator  [facility]  Instruction Review Code 1- Verbalizes Understanding       Know Your Numbers and Heart Failure: - Group verbal and visual instruction to discuss disease risk factors for cardiac and pulmonary disease and treatment options.  Reviews associated critical values for Overweight/Obesity, Hypertension, Cholesterol, and Diabetes.  Discusses basics of heart failure: signs/symptoms and treatments.  Introduces Heart Failure Zone chart for action plan for heart failure.  Written material given at graduation. Flowsheet Row Pulmonary Rehab from 12/31/2022 in [facility] Cardiac and Pulmonary Rehab  Education need identified 11/27/22       Core Components/Risk Factors/Patient Goals Review:   Goals and Risk Factor Review     Row Name 12/17/22 0945             Core Components/Risk Factors/Patient Goals Review   Personal Goals Review Improve shortness of breath with ADL's       Review Spoke to patient about their shortness of breath and what they can do to improve. Patient has been informed of breathing techniques when starting the program. Patient is informed to tell staff if they have had any med changes and that certain meds they are taking or not taking can be causing shortness of breath.       Expected Outcomes Short: Attend [facility] regularly to improve shortness of breath with ADL's. Long: maintain independence  with ADL's                Core Components/Risk Factors/Patient Goals at Discharge (Final Review):   Goals and Risk Factor Review - 12/17/22 0945       Core Components/Risk Factors/Patient Goals Review   Personal Goals Review Improve shortness of breath with ADL's    Review Spoke to patient about their shortness of breath and what they can do to improve. Patient has been informed of breathing techniques when starting the program. Patient is informed to tell staff if they have had any med changes and that certain meds they are taking or not taking can be causing shortness of breath.    Expected Outcomes Short: Attend [facility] regularly to improve shortness of breath with ADL's. Long: maintain independence with ADL's             ITP Comments:  ITP Comments     Row Name 11/19/22 1347 11/27/22 1202 12/03/22 0929 12/09/22 1407 01/07/23 1127   ITP Comments Virtual Visit completed. Patient informed on EP and RD appointment and 6 Minute walk test. Patient also informed of patient health questionnaires on My Chart. Patient Verbalizes understanding. Visit diagnosis can be found in [facility] 08/06/2022. Completed and gym orientation. Initial ITP created and sent for review to Dr. Vida Rigger, Medical Director. First full day of exercise!  Patient was oriented to gym and equipment including functions, settings, policies, and procedures.  Patient's individual exercise prescription and treatment plan were reviewed.  All starting workloads were established based on the results of the 6 minute walk test done at initial orientation visit.  The plan for exercise progression was also introduced and progression will be customized based on patient's performance and goals. 30 Day review completed. Medical Director ITP review done, changes made as directed, and signed approval by Medical Director.   new to program 30 Day review completed. Medical Director ITP review done, changes made as directed, and signed  approval by Medical Director.    Row Name 02/04/23 [phone] 02/18/23 1356         ITP Comments 30 Day review completed. Medical Director  ITP review done, changes made as directed, and signed approval by Medical Director. Pt called rehab this morning stating that he would like to discharge early from the program due to financial concerns. He voiced his appreciation for the program and states he will continue exercise through [employer] program. Sanjiv completed 15/36 sessions.               Comments: Discharge ITP

## 2023-02-18 NOTE — Telephone Encounter (Signed)
Pt called rehab this morning stating that he would like to discharge early from the program due to financial concerns. He voiced his appreciation for the program and states he will continue exercise through [employer] program. Glen Jenkins completed 15/36 sessions.

## 2023-02-18 NOTE — Progress Notes (Signed)
Discharge Summary:  Glen Jenkins  (DOB: 1940-07-07)  [name] discharged early from pulmonary rehab due to financial reasons. He completed 15/36 sessions. He states he will continue exercise at home and at gym using [employer] program.    6 Minute Walk     Row Name 11/27/22 1341         6 Minute Walk   Phase Initial     Distance 900 feet     Walk Time 6 minutes     # of Rest Breaks 0     MPH 1.7     METS 1.26     RPE 13     Perceived Dyspnea  3     VO2 Peak 4.39     Symptoms Yes (comment)     Comments SOB     Resting HR 53 bpm     Resting BP 138/56     Resting Oxygen Saturation  95 %     Exercise Oxygen Saturation  during 6 min walk 91 %     Max Ex. HR 110 bpm     Max Ex. BP 158/62     2 Minute Post BP 134/58       Interval HR   1 Minute HR 68     2 Minute HR 79     3 Minute HR 86     4 Minute HR 110     5 Minute HR 106     6 Minute HR 98     2 Minute Post HR 66     Interval Heart Rate? Yes       Interval Oxygen   Interval Oxygen? Yes     Baseline Oxygen Saturation % 95 %     1 Minute Oxygen Saturation % 95 %     1 Minute Liters of Oxygen 0 L  RA     2 Minute Oxygen Saturation % 95 %     2 Minute Liters of Oxygen 0 L  RA     3 Minute Oxygen Saturation % 97 %     3 Minute Liters of Oxygen 0 L  RA     4 Minute Oxygen Saturation % 95 %     4 Minute Liters of Oxygen 0 L  RA     5 Minute Oxygen Saturation % 94 %     5 Minute Liters of Oxygen 0 L  RA     6 Minute Oxygen Saturation % 91 %     6 Minute Liters of Oxygen 0 L  RA     2 Minute Post Oxygen Saturation % 98 %     2 Minute Post Liters of Oxygen 0 L  RA

## 2023-02-23 ENCOUNTER — Other Ambulatory Visit: Payer: Self-pay | Admitting: Pulmonary Disease

## 2023-02-23 DIAGNOSIS — R918 Other nonspecific abnormal finding of lung field: Secondary | ICD-10-CM

## 2023-02-26 ENCOUNTER — Encounter
Admission: RE | Admit: 2023-02-26 | Discharge: 2023-02-26 | Disposition: A | Discharge status: Home / Self Care | Payer: Medicare Other | Source: Ambulatory Visit | Attending: Pulmonary Disease | Admitting: Pulmonary Disease

## 2023-02-26 DIAGNOSIS — R918 Other nonspecific abnormal finding of lung field: Secondary | ICD-10-CM | POA: Insufficient documentation

## 2023-02-26 LAB — GLUCOSE, CAPILLARY: Glucose-Capillary: 87 mg/dL (ref 70–99)

## 2023-02-26 MED ORDER — FLUDEOXYGLUCOSE F - 18 (FDG) INJECTION
12.3200 | Freq: Once | INTRAVENOUS | Status: AC | PRN
  Administered 2023-02-26: 12.32 via INTRAVENOUS

## 2023-03-12 DIAGNOSIS — E66812 Obesity, class 2: Secondary | ICD-10-CM | POA: Insufficient documentation

## 2023-04-23 ENCOUNTER — Ambulatory Visit: Payer: Medicare Other | Admitting: Podiatry

## 2023-04-27 ENCOUNTER — Encounter: Payer: Self-pay | Admitting: Podiatry

## 2023-04-27 ENCOUNTER — Ambulatory Visit: Payer: Medicare Other | Admitting: Podiatry

## 2023-04-27 VITALS — Ht 73.0 in | Wt 280.0 lb

## 2023-04-27 DIAGNOSIS — L84 Corns and callosities: Secondary | ICD-10-CM | POA: Diagnosis not present

## 2023-04-27 DIAGNOSIS — M79675 Pain in left toe(s): Secondary | ICD-10-CM | POA: Diagnosis not present

## 2023-04-27 DIAGNOSIS — M79674 Pain in right toe(s): Secondary | ICD-10-CM

## 2023-04-27 DIAGNOSIS — Z794 Long term (current) use of insulin: Secondary | ICD-10-CM

## 2023-04-27 DIAGNOSIS — E119 Type 2 diabetes mellitus without complications: Secondary | ICD-10-CM

## 2023-04-27 DIAGNOSIS — B351 Tinea unguium: Secondary | ICD-10-CM | POA: Diagnosis not present

## 2023-04-27 NOTE — Progress Notes (Addendum)
  Subjective:  Patient ID: TRAVANTI BRANDO, male    DOB: September 25, 1940,  MRN: [id number]  Chucky May presents to clinic today for: preventative diabetic foot care and callus(es) b/l feet and painful thick toenails that are difficult to trim. Painful toenails interfere with ambulation. Aggravating factors include wearing enclosed shoe gear. Pain is relieved with periodic professional debridement. Painful calluses are aggravated when weightbearing with and without shoegear. Pain is relieved with periodic professional debridement. Chief Complaint  Patient presents with   Diabetes    Patient is here for RFC, Last A1C: 605(03/27/2023)-Pt is on blood thinners   PCP is Marguarite Arbour, MD.  Allergies  Allergen Reactions   Sulfa Antibiotics    Review of Systems: Negative except as noted in the HPI.  Objective: There were no vitals filed for this visit.  Fount KAICEN MCCUNE is a pleasant 82 y.o. male in NAD. AAO x 3.  Vascular Examination: Capillary refill time <3 seconds b/l LE. Palpable pedal pulses b/l LE. Skin temperature gradient WNL b/l. No varicosities b/l. Trace edema noted BLE.Marland Kitchen  Dermatological Examination: Pedal skin with normal turgor, texture and tone b/l. No open wounds. No interdigital macerations b/l. Toenails 2-5 b/l thickened, discolored, dystrophic with subungual debris. There is pain on palpation to dorsal aspect of nailplates.   Incurvated nailplate medial border left hallux and both borders of right hallux.  Nail border hypertrophy absent. There is tenderness to palpation. Sign(s) of infection: no clinical signs of infection noted on examination today.  Hyperkeratotic lesion(s) submet head 1 right foot and submet head 5 b/l.  No erythema, no edema, no drainage, no fluctuance..  Neurological Examination: Protective sensation intact with 10 gram monofilament b/l LE. Vibratory sensation intact b/l LE.   Musculoskeletal Examination: Muscle strength 5/5 to all lower extremity  muscle groups bilaterally. Pes planus deformity noted bilateral LE. Patient ambulates independent of any assistive aids.  Assessment/Plan: 1. Pain due to onychomycosis of toenails of both feet   2. Callus   3. Type 2 diabetes mellitus without complication, with long-term current use of insulin (HCC)   -Patient was evaluated today. All questions/concerns addressed on today's visit. -Continue foot and shoe inspections daily. Monitor blood glucose per PCP/Endocrinologist's recommendations. -Toenails x 10 debrided in length and girth without iatrogenic bleeding with sterile nail nipper and dremel.  -Callus(es) submet head 1 right foot, submet head 5 left foot, and submet head 5 right foot pared utilizing sterile scalpel blade without complication or incident. Total number debrided =3. -Patient/POA to call should there be question/concern in the interim.   Return in about 10 weeks (around 07/06/2023).  Freddie Breech, DPM      [place] LOCATION: [address], [place] [address]                   Office [phone]   [facility] LOCATION: [address] [place], [place] [address] Office [phone]

## 2023-06-16 DIAGNOSIS — G5603 Carpal tunnel syndrome, bilateral upper limbs: Secondary | ICD-10-CM | POA: Insufficient documentation

## 2023-07-03 ENCOUNTER — Emergency Department
Admission: EM | Admit: 2023-07-03 | Discharge: 2023-07-03 | Discharge status: Against Medical Advice | Payer: Medicare Other

## 2023-07-03 NOTE — ED Notes (Signed)
Patients wife notified security he was leaving

## 2023-07-30 ENCOUNTER — Ambulatory Visit: Payer: Medicare Other | Admitting: Podiatry

## 2023-07-30 DIAGNOSIS — B351 Tinea unguium: Secondary | ICD-10-CM | POA: Diagnosis not present

## 2023-07-30 DIAGNOSIS — M79674 Pain in right toe(s): Secondary | ICD-10-CM | POA: Diagnosis not present

## 2023-07-30 DIAGNOSIS — M79675 Pain in left toe(s): Secondary | ICD-10-CM | POA: Diagnosis not present

## 2023-07-30 DIAGNOSIS — E119 Type 2 diabetes mellitus without complications: Secondary | ICD-10-CM | POA: Diagnosis not present

## 2023-07-30 DIAGNOSIS — Z794 Long term (current) use of insulin: Secondary | ICD-10-CM | POA: Diagnosis not present

## 2023-07-30 DIAGNOSIS — L84 Corns and callosities: Secondary | ICD-10-CM

## 2023-08-02 ENCOUNTER — Encounter: Payer: Self-pay | Admitting: Podiatry

## 2023-08-02 NOTE — Progress Notes (Signed)
  Subjective:  Patient ID: Glen Jenkins, male    DOB: 1941/02/18,  MRN: [id number]  Glen Jenkins presents to clinic today for preventative diabetic foot care and callus(es) of both feet and painful mycotic toenails that are difficult to trim. Painful toenails interfere with ambulation. Aggravating factors include wearing enclosed shoe gear. Pain is relieved with periodic professional debridement. Painful calluses are aggravated when weightbearing with and without shoegear. Pain is relieved with periodic professional debridement.  Chief Complaint  Patient presents with   Diabetes    "Trim my nails."  PCP - Dr. Aram Jenkins saw in Jan. 2025, A1c - 7.1   New problem(s): None.   PCP is Sparks, Glen Lope, MD.  Allergies  Allergen Reactions   Marcelline Deist [Dapagliflozin] Other (See Comments)    My BP dropped tremendously.   Sulfa Antibiotics     Review of Systems: Negative except as noted in the HPI.  Objective: No changes noted in today's physical examination. There were no vitals filed for this visit. Glen Jenkins is a pleasant 83 y.o. male obese in NAD. AAO x 3.  Vascular Examination: Capillary refill time <3 seconds b/l LE. Palpable pedal pulses b/l LE. Skin temperature gradient WNL b/l. No varicosities b/l. Trace edema noted BLE.Marland Kitchen  Dermatological Examination: Pedal skin with normal turgor, texture and tone b/l. No open wounds. No interdigital macerations b/l. Toenails 2-5 b/l thickened, discolored, dystrophic with subungual debris. There is pain on palpation to dorsal aspect of nailplates.   Incurvated nailplate medial border left hallux and both borders of right hallux.  Nail border hypertrophy absent. There is tenderness to palpation. Sign(s) of infection: no clinical signs of infection noted on examination today.  Hyperkeratotic lesion(s) submet head 1 right foot and submet head 5 b/l.  No erythema, no edema, no drainage, no fluctuance..  Neurological Examination: Protective  sensation intact with 10 gram monofilament b/l LE. Vibratory sensation intact b/l LE.   Musculoskeletal Examination: Muscle strength 5/5 to all lower extremity muscle groups bilaterally. Pes planus deformity noted bilateral LE. Patient ambulates independent of any assistive aids.  Assessment/Plan: 1. Pain due to onychomycosis of toenails of both feet   2. Callus   3. Type 2 diabetes mellitus without complication, with long-term current use of insulin (HCC)     Patient was evaluated and treated. All patient's and/or POA's questions/concerns addressed on today's visit. Mycotic toenails 1-5 debrided in length and girth without incident. Callus(es) submet head 1 right foot and submet head 5 b/l pared with sharp debridement without incident. Continue daily foot inspections and monitor blood glucose per PCP/Endocrinologist's recommendations. Continue soft, supportive shoe gear daily. Report any pedal injuries to medical professional. Call office if there are any questions/concerns. -Patient/POA to call should there be question/concern in the interim.   Return in about 3 months (around 10/27/2023).  Freddie Breech, DPM      [place] LOCATION: [address], [place] [address]                   Office [phone]   [facility] LOCATION: [address] [place], [place] [address] Office [phone]

## 2023-08-06 ENCOUNTER — Other Ambulatory Visit: Payer: Self-pay | Admitting: Emergency Medicine

## 2023-08-06 DIAGNOSIS — J849 Interstitial pulmonary disease, unspecified: Secondary | ICD-10-CM

## 2023-08-10 ENCOUNTER — Ambulatory Visit
Admission: RE | Admit: 2023-08-10 | Discharge: 2023-08-10 | Disposition: A | Discharge status: Home / Self Care | Source: Ambulatory Visit | Attending: Emergency Medicine | Admitting: Emergency Medicine

## 2023-08-10 DIAGNOSIS — J849 Interstitial pulmonary disease, unspecified: Secondary | ICD-10-CM | POA: Insufficient documentation

## 2023-11-02 ENCOUNTER — Ambulatory Visit: Payer: Medicare Other | Admitting: Podiatry

## 2023-11-02 ENCOUNTER — Encounter: Payer: Self-pay | Admitting: Podiatry

## 2023-11-02 VITALS — Ht 73.0 in | Wt 280.0 lb

## 2023-11-02 DIAGNOSIS — B351 Tinea unguium: Secondary | ICD-10-CM

## 2023-11-02 DIAGNOSIS — M79674 Pain in right toe(s): Secondary | ICD-10-CM | POA: Diagnosis not present

## 2023-11-02 DIAGNOSIS — M2141 Flat foot [pes planus] (acquired), right foot: Secondary | ICD-10-CM

## 2023-11-02 DIAGNOSIS — E119 Type 2 diabetes mellitus without complications: Secondary | ICD-10-CM

## 2023-11-02 DIAGNOSIS — M2142 Flat foot [pes planus] (acquired), left foot: Secondary | ICD-10-CM | POA: Diagnosis not present

## 2023-11-02 DIAGNOSIS — L84 Corns and callosities: Secondary | ICD-10-CM | POA: Diagnosis not present

## 2023-11-02 DIAGNOSIS — Z794 Long term (current) use of insulin: Secondary | ICD-10-CM | POA: Diagnosis not present

## 2023-11-02 DIAGNOSIS — M79675 Pain in left toe(s): Secondary | ICD-10-CM

## 2023-11-02 NOTE — Progress Notes (Unsigned)
 ANNUAL DIABETIC FOOT EXAM  Subjective: Glen Jenkins presents today for annual diabetic foot exam.  Chief Complaint  Patient presents with   Nail Problem    Pt is here for [facility] last A1C was 7 PCP is Dr Claudius Cumins and LOV was in January.   Patient denies any h/o foot wounds.  Glen Helms, MD is patient's PCP.  Past Medical History:  Diagnosis Date   AAA (abdominal aortic aneurysm) (HCC)    Anginal pain (HCC)    Arthritis    lower back   Cancer (HCC)    Lung Cancer; Squamous Cell Carcinoma   Cardiomyopathy (HCC)    Coronary artery disease    CTS (carpal tunnel syndrome)    right - numbness thumb and 1st and 2nd fingers   Diabetes mellitus without complication (HCC)    Dysrhythmia    Atrial Fibrillation   GERD (gastroesophageal reflux disease)    Hypercholesterolemia    Hypertension    Nephropathy, diabetic (HCC)    Onychomycosis    Renal insufficiency    Sleep apnea    Varicose veins    Wears dentures    partial upper   Wears hearing aid in both ears    Patient Active Problem List   Diagnosis Date Noted   Bilateral carpal tunnel syndrome 06/16/2023   Class 2 severe obesity due to excess calories with serious comorbidity and body mass index (BMI) of 37.0 to 37.9 in adult (HCC) 03/12/2023   Pain due to onychomycosis of toenails of both feet 05/05/2022   Coronary atherosclerosis 02/13/2020   Hypercholesterolemia 02/13/2020   Localized edema 02/13/2020   Proteinuria 02/13/2020   Abdominal aortic aneurysm (HCC) 02/13/2020   Angina pectoris (HCC) 02/13/2020   Atrial fibrillation (HCC) 02/13/2020   Cardiomyopathy (HCC) 02/13/2020   Edema of lower extremity 08/08/2019   Essential hypertension 08/08/2019   Macular degeneration 10/20/2018   Bleeding hemorrhoids 02/09/2017   CKD (chronic kidney disease) stage 3, GFR 30-59 ml/min (HCC) 01/04/2016   Type 2 diabetes mellitus (HCC) 09/18/2014   Past Surgical History:  Procedure Laterality Date   CATARACT EXTRACTION  W/PHACO Left 06/27/2020   Procedure: CATARACT EXTRACTION PHACO AND INTRAOCULAR LENS PLACEMENT (IOC) LEFT DIABETIC 13.57 01:47.8 12.6%;  Surgeon: Annell Kidney, MD;  Location: [facility];  Service: Ophthalmology;  Laterality: Left;  Diabetic - oral meds   CATARACT EXTRACTION W/PHACO Right 07/11/2020   Procedure: CATARACT EXTRACTION PHACO AND INTRAOCULAR LENS PLACEMENT (IOC) RIGHT DIABETIC;  Surgeon: Annell Kidney, MD;  Location: [facility];  Service: Ophthalmology;  Laterality: Right;  9.28 1:15.4 12.3%   COLONOSCOPY WITH PROPOFOL  N/A 09/28/2015   Procedure: COLONOSCOPY WITH PROPOFOL ;  Surgeon: Cassie Click, MD;  Location: [facility];  Service: Endoscopy;  Laterality: N/A;   CORONARY ANGIOPLASTY     Stent placement x 3 (1999, 0981,1914)   TONSILLECTOMY     Current Outpatient Medications on File Prior to Visit  Medication Sig Dispense Refill   acetaminophen  (TYLENOL ) 500 MG tablet Take by mouth.     amiodarone (PACERONE) 200 MG tablet Take 0.5 tablets by mouth daily.     apixaban (ELIQUIS) 5 MG TABS tablet Take by mouth.     CONTOUR NEXT TEST test strip      docusate calcium (SURFAK) 240 MG capsule Take by mouth.     ELIQUIS 5 MG TABS tablet Take 5 mg by mouth 2 (two) times daily.     furosemide (LASIX) 40 MG tablet Take 1 tablet by mouth  2 (two) times daily.     glipiZIDE (GLUCOTROL XL) 10 MG 24 hr tablet Take 10 mg by mouth daily with breakfast.     isosorbide mononitrate (IMDUR) 30 MG 24 hr tablet Take 30 mg by mouth daily.     lisinopril (ZESTRIL) 40 MG tablet Take 1 tablet by mouth daily.     metFORMIN (GLUCOPHAGE) 1000 MG tablet Take 1,000 mg by mouth 2 (two) times daily with a meal.     Microlet Lancets MISC once daily Use as instructed.     Multiple Vitamin (ANTIOXIDANT A/C/E PO) Take 1 tablet by mouth daily.     Multiple Vitamins-Minerals (ICAPS AREDS 2 PO) Take by mouth.     simvastatin (ZOCOR) 40 MG tablet Take 1 tablet by mouth at bedtime.      allopurinol (ZYLOPRIM) 100 MG tablet Take by mouth.     ipratropium-albuterol (DUONEB) 0.5-2.5 (3) MG/3ML SOLN Inhale into the lungs.     No current facility-administered medications on file prior to visit.    Allergies  Allergen Reactions   Farxiga [Dapagliflozin] Other (See Comments)    My BP dropped tremendously.   Sulfa Antibiotics    Social History   Occupational History   Not on file  Tobacco Use   Smoking status: Former    Current packs/day: 0.00    Average packs/day: 3.0 packs/day for 42.0 years (126.0 ttl pk-yrs)    Types: Cigarettes    Start date: 57    Quit date: 1999    Years since quitting: 26.4   Smokeless tobacco: Never  Vaping Use   Vaping status: Never Used  Substance and Sexual Activity   Alcohol use: Yes    Alcohol/week: 3.0 standard drinks of alcohol    Types: 3 Standard drinks or equivalent per week    Comment: 2 drinks a week   Drug use: No   Sexual activity: Not on file   History reviewed. No pertinent family history. Immunization History  Administered Date(s) Administered   Fluad Quad(high Dose 65+) 02/28/2019   Fluzone Influenza virus vaccine,trivalent (IIV3), split virus 03/15/2014, 04/02/2015   Influenza Inj Mdck Quad Pf 03/06/2017, 04/08/2018, 03/06/2021, 03/10/2022   Influenza, Mdck, Trivalent,PF 6+ MOS(egg free) 03/12/2023   Influenza,inj,Quad PF,6+ Mos 02/09/2020   Influenza-Unspecified 02/17/2012, 03/17/2013, 03/26/2016, 03/06/2017   PFIZER(Purple Top)SARS-COV-2 Vaccination 06/09/2019, 06/30/2019   Pneumococcal Polysaccharide-23 02/28/2009   Td (Adult),5 Lf Tetanus Toxid, Preservative Free 04/07/2016     Review of Systems: Negative except as noted in the HPI.   Objective: There were no vitals filed for this visit.  Glen Jenkins is a pleasant 83 y.o. male in NAD. AAO X 3.  Diabetic foot exam was performed with the following findings:   Vascular Examination: Capillary refill time immediate b/l. Vascular status intact b/l  with palpable pedal pulses. Pedal hair present b/l. No pain with calf compression b/l. Skin temperature gradient WNL b/l. No cyanosis or clubbing b/l. No ischemia or gangrene noted b/l. Spider veins noted b/l. Trace edema b/l LE.Aaron Aas  Neurological Examination: Sensation grossly intact b/l with 10 gram monofilament. Vibratory sensation intact b/l.   Dermatological Examination: Pedal skin with normal turgor, texture and tone b/l.  No open wounds. No interdigital macerations.   Toenails 1-5 b/l thick, discolored, elongated with subungual debris and pain on dorsal palpation.   Hyperkeratotic lesion(s) submet head 1 right foot and submet head 5 b/l.  No erythema, no edema, no drainage, no fluctuance.  Musculoskeletal Examination: Muscle strength 5/5 to all lower  extremity muscle groups bilaterally. Pes planus deformity noted bilateral LE. Patient ambulates independent of any assistive aids.  Radiographs: None   ADA Risk Categorization: Low Risk :  Patient has all of the following: Intact protective sensation No prior foot ulcer  No severe deformity Pedal pulses present  Assessment: 1. Pain due to onychomycosis of toenails of both feet   2. Callus   3. Pes planus of both feet   4. Type 2 diabetes mellitus without complication, with long-term current use of insulin (HCC)   5. Encounter for diabetic foot exam (HCC)     Plan: Diabetic foot examination performed today. All patient's and/or POA's questions/concerns addressed on today's visit. Toenails 1-5 debrided in length and girth without incident. Callus(es) submet head 1 right foot and submet head 5 b/l pared with sharp debridement without incident. Continue daily foot inspections and monitor blood glucose per PCP/Endocrinologist's recommendations.Continue soft, supportive shoe gear daily. Report any pedal injuries to medical professional. Call office if there are any questions/concerns. -Patient/POA to call should there be question/concern  in the interim. Return in about 3 months (around 02/02/2024).  Luella Sager, DPM      [place] LOCATION: [address], [place] [address]                   Office [phone]   [facility] LOCATION: [address] [place], [place] [address] Office [phone]

## 2023-11-22 IMAGING — MR MR HEAD W/O CM
13 series · 48 of 48 positions shown · non-contrast
Comparison: Prior head CT examinations 05/18/2017 and earlier.
Brain MRI 09/19/2010.

CLINICAL DATA: Provided history: Somnolence. Malaise and fatigue.
Additional history provided by scanning technologist: Patient
reports working very long hours and feeling stressed/overwhelmed,
posterior headache, generalized weakness, fatigue, malaise.

EXAM:
MRI HEAD WITHOUT CONTRAST
TECHNIQUE: Multiplanar, multiecho pulse sequences of the brain and surrounding
structures were obtained without intravenous contrast.

[Series 5: ax dwi_tracew · axial · 3.0mm · 0.65mm/px · z∈[-43,+121]mm · 2 of 52 slices shown]
[im 1/52]
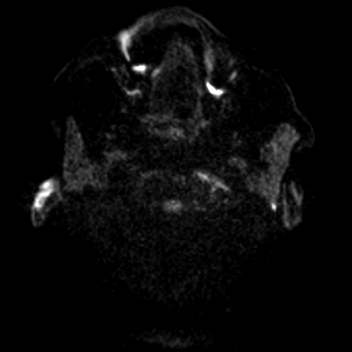
[im 52/52]
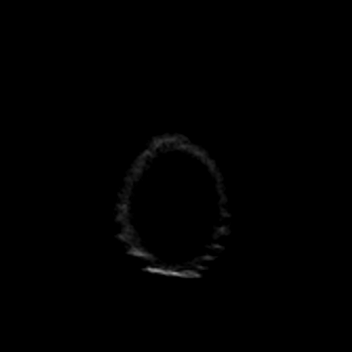

[Series 6: ax dwi_adc · axial · 3.0mm · 0.65mm/px · z∈[-43,+121]mm · 3 of 52 slices shown]
[im 1/52]
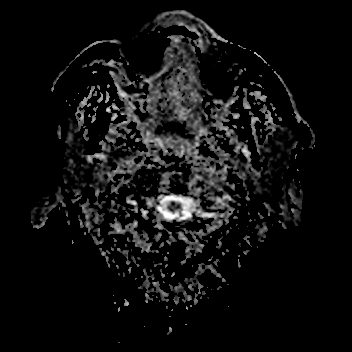
[im 26/52]
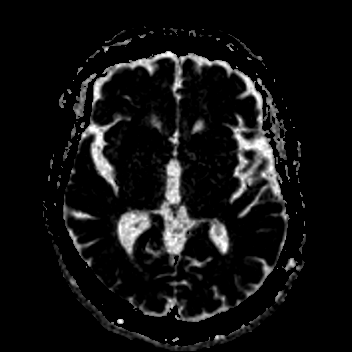
[im 52/52]
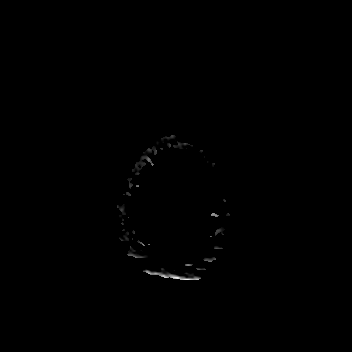

[Series 7: cor dwi_tracew · coronal · 5.0mm · 0.68mm/px · 3 of 40 slices shown]
[im 1/40]
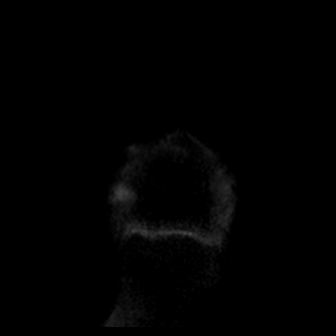
[im 20/40]
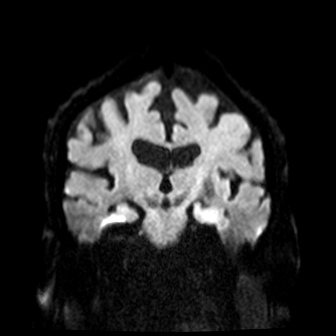
[im 40/40]
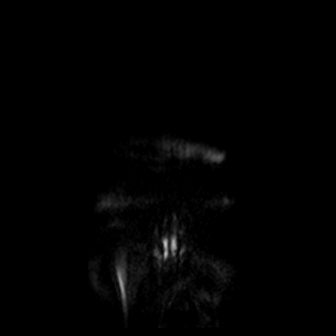

[Series 8: cor dwi_adc · coronal · 5.0mm · 0.68mm/px · 3 of 40 slices shown]
[im 1/40]
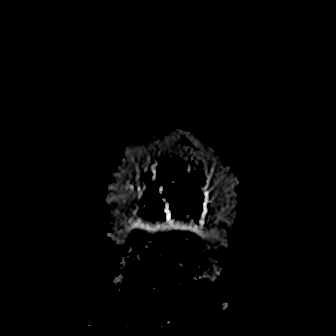
[im 20/40]
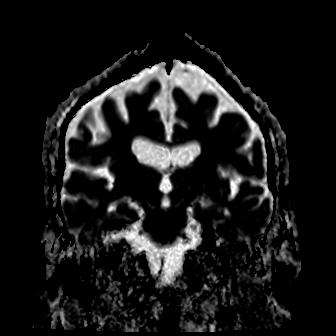
[im 40/40]
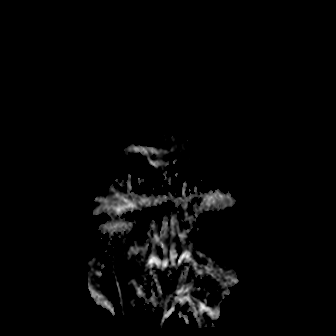

[Series 9: T1 · sagittal · 5.0mm · 0.62mm/px · 2 of 25 slices shown (1 of 2)]
[im 1/25]
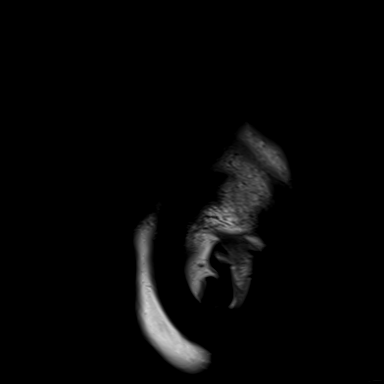
[im 25/25]
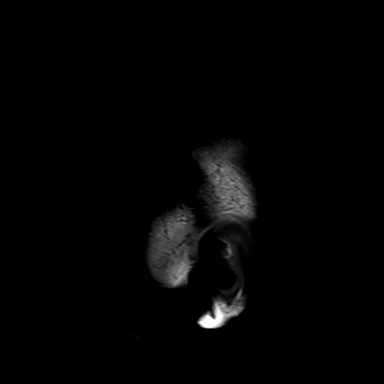

[Series 10: T2 · axial · 5.0mm · 0.53mm/px · z∈[-42,+121]mm · 2 of 29 slices shown (1 of 2)]
[im 1/29]
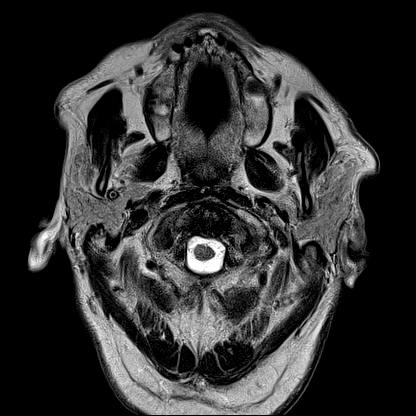
[im 29/29]
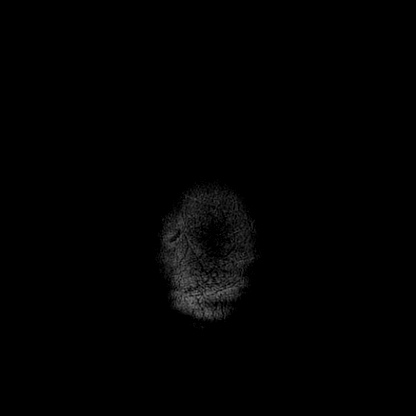

[Series 11: mag_images · axial · 3.0mm · 0.90mm/px · z∈[-46,+126]mm · 4 of 60 slices shown]
[im 1/60]
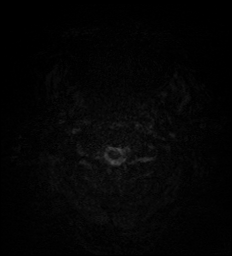
[im 20/60]
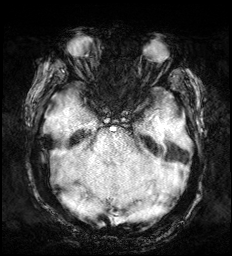
[im 40/60]
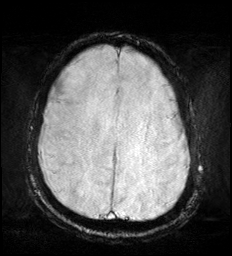
[im 60/60]
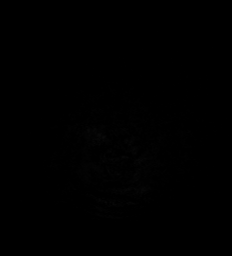

[Series 12: pha_images · axial · 3.0mm · 0.90mm/px · z∈[-43,+126]mm · 4 of 58 slices shown]
[im 1/58]
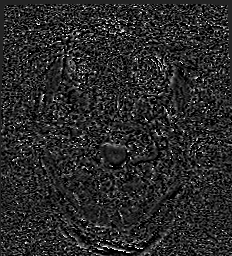
[im 20/58]
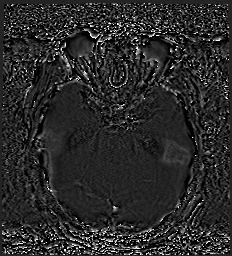
[im 39/58]
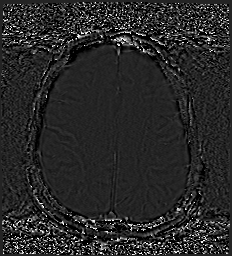
[im 58/58]
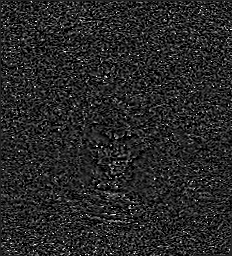

[Series 13: swi_images · axial · 3.0mm · 0.90mm/px · z∈[-46,+126]mm · 4 of 60 slices shown]
[im 1/60]
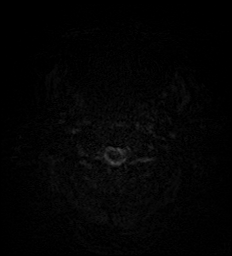
[im 20/60]
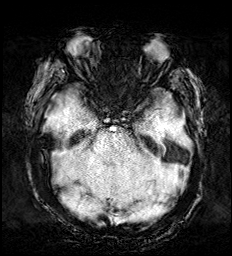
[im 40/60]
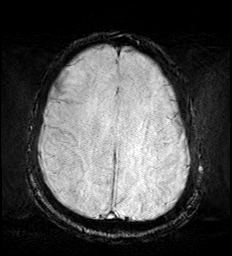
[im 60/60]
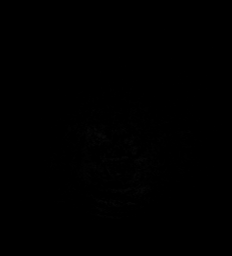

[Series 15: FLAIR · axial · 3.0mm · 0.53mm/px · z∈[-40,+118]mm · 4 of 55 slices shown (1 of 2)]
[im 1/55]
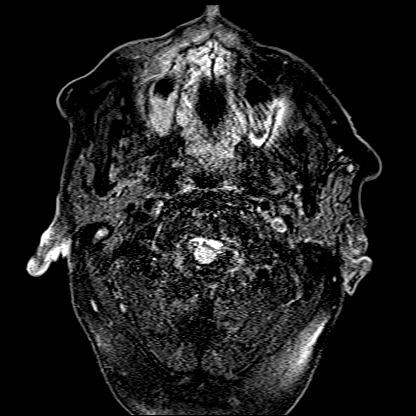
[im 19/55]
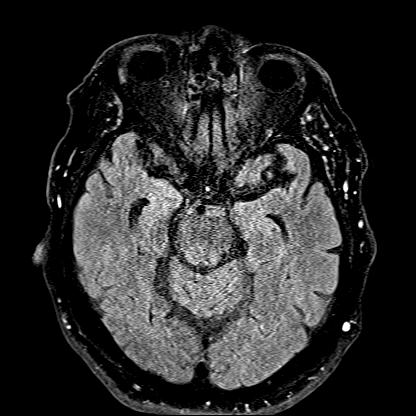
[im 37/55]
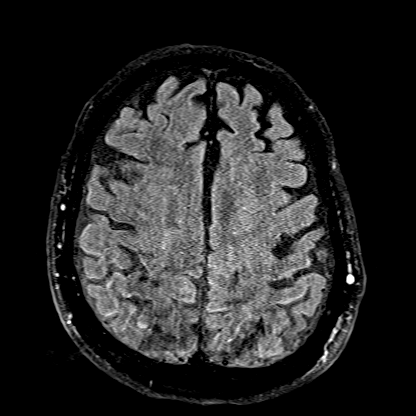
[im 55/55]
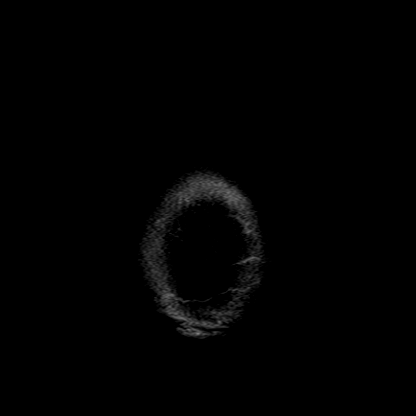

[Series 16: T1 · axial · 1.0mm · 0.98mm/px · z∈[-36,+134]mm · 11 of 175 slices shown (2 of 2)]
[im 1/175]
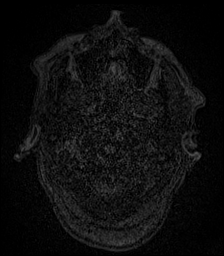
[im 18/175]
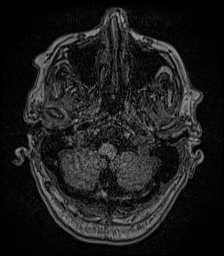
[im 35/175]
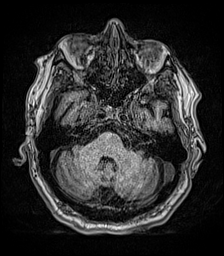
[im 53/175]
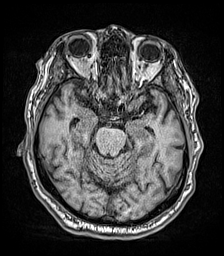
[im 70/175]
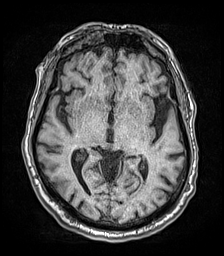
[im 88/175]
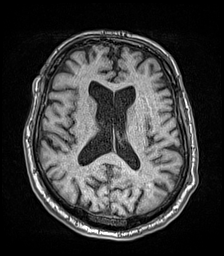
[im 105/175]
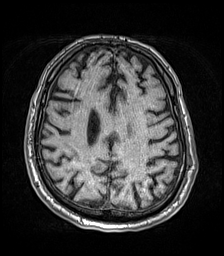
[im 122/175]
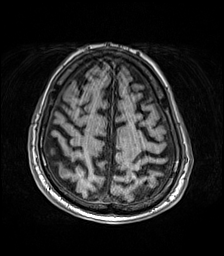
[im 140/175]
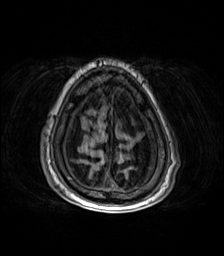
[im 157/175]
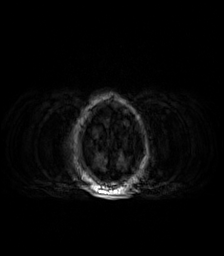
[im 175/175]
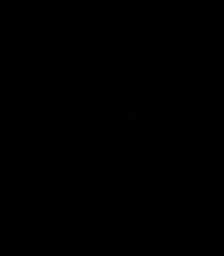

[Series 17: T2 · coronal · 5.0mm · 0.45mm/px · 2 of 30 slices shown (2 of 2)]
[im 1/30]
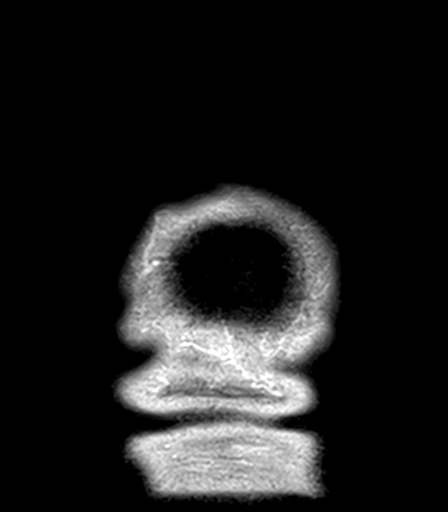
[im 30/30]
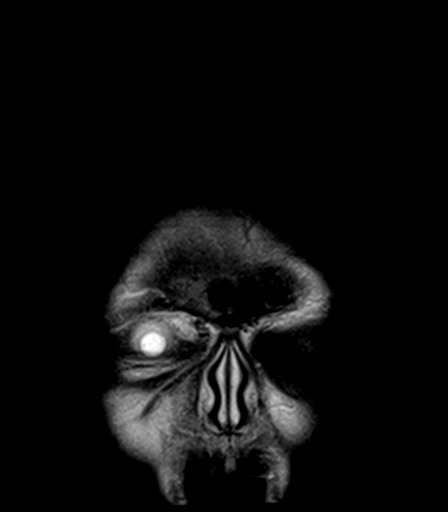

[Series 18: FLAIR · axial · 3.0mm · 1.20mm/px · z∈[-38,+120]mm · 4 of 55 slices shown (2 of 2)]
[im 1/55]
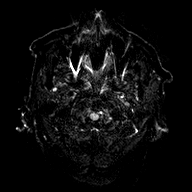
[im 19/55]
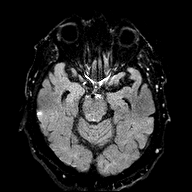
[im 37/55]
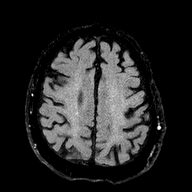
[im 55/55]
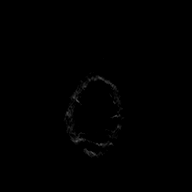

[48 of 48 positions shown; findings below may reference images not displayed]

FINDINGS: Intermittently motion degraded examination. Most notably, there is
moderate motion degradation of the sagittal T1-weighted sequence.

Brain:

Mild generalized cerebral atrophy.

Minimal multifocal T2 FLAIR hyperintense signal abnormality within
the cerebral white matter (predominantly in the periatrial regions),
nonspecific but compatible with chronic small vessel ischemic
disease.

There are two apparent small foci of T2 FLAIR hyperintense signal
abnormality along the high right frontoparietal lobes. However,
these foci are only appreciated one of the two acquired T2 FLAIR
sequences, there is no corresponding signal abnormality at these
sites on the remaining sequences, and these findings are likely
artifactual.

No cortical encephalomalacia is identified.

There is no acute infarct.

No evidence of an intracranial mass.

No chronic intracranial blood products.

No extra-axial fluid collection.

No midline shift.

Vascular: Maintained flow voids within the proximal large arterial
vessels.

Skull and upper cervical spine: No focal suspicious marrow lesion.

Sinuses/Orbits: Visualized orbits show no acute finding. Bilateral
ocular lens replacements. Mild mucosal thickening within the
bilateral ethmoid and maxillary sinuses.

Other: Bilateral mastoid effusions (right greater than left).
IMPRESSION: Intermittently motion degraded examination.

No evidence of acute intracranial abnormality.

Minimal chronic small vessel ischemic changes within the cerebral
white matter, new from the prior MRI of 09/19/2010.

Mild generalized cerebral atrophy.

Mild mucosal thickening within the bilateral ethmoid and maxillary
sinuses.

Bilateral mastoid effusions (greater on the right).

## 2024-01-26 ENCOUNTER — Ambulatory Visit: Attending: Internal Medicine | Admitting: Physical Therapy

## 2024-01-26 DIAGNOSIS — R269 Unspecified abnormalities of gait and mobility: Secondary | ICD-10-CM | POA: Insufficient documentation

## 2024-01-26 DIAGNOSIS — R2681 Unsteadiness on feet: Secondary | ICD-10-CM | POA: Diagnosis present

## 2024-01-26 DIAGNOSIS — R2689 Other abnormalities of gait and mobility: Secondary | ICD-10-CM | POA: Diagnosis present

## 2024-01-26 DIAGNOSIS — M6281 Muscle weakness (generalized): Secondary | ICD-10-CM | POA: Insufficient documentation

## 2024-01-26 DIAGNOSIS — R262 Difficulty in walking, not elsewhere classified: Secondary | ICD-10-CM | POA: Insufficient documentation

## 2024-01-26 NOTE — Therapy (Signed)
 OUTPATIENT PHYSICAL THERAPY NEURO EVALUATION   Patient Name: Glen Jenkins MRN: [id number] DOB:Jun 27, 1940, 83 y.o., male Today's Date: 01/26/2024   PCP:   Auston Reyes BIRCH, MD   REFERRING PROVIDER:   Auston Reyes BIRCH, MD    END OF SESSION:  PT End of Session - 01/26/24 1325     Visit Number 1    Number of Visits 24    Date for PT Re-Evaluation 04/19/24    Progress Note Due on Visit 10    PT Start Time 1318    PT Stop Time 1357    PT Time Calculation (min) 39 min    Equipment Utilized During Treatment Gait belt    Activity Tolerance Patient tolerated treatment well    Behavior During Therapy WFL for tasks assessed/performed          Past Medical History:  Diagnosis Date   AAA (abdominal aortic aneurysm) (HCC)    Anginal pain (HCC)    Arthritis    lower back   Cancer (HCC)    Lung Cancer; Squamous Cell Carcinoma   Cardiomyopathy (HCC)    Coronary artery disease    CTS (carpal tunnel syndrome)    right - numbness thumb and 1st and 2nd fingers   Diabetes mellitus without complication (HCC)    Dysrhythmia    Atrial Fibrillation   GERD (gastroesophageal reflux disease)    Hypercholesterolemia    Hypertension    Nephropathy, diabetic (HCC)    Onychomycosis    Renal insufficiency    Sleep apnea    Varicose veins    Wears dentures    partial upper   Wears hearing aid in both ears    Past Surgical History:  Procedure Laterality Date   CATARACT EXTRACTION W/PHACO Left 06/27/2020   Procedure: CATARACT EXTRACTION PHACO AND INTRAOCULAR LENS PLACEMENT (IOC) LEFT DIABETIC 13.57 01:47.8 12.6%;  Surgeon: Mittie Gaskin, MD;  Location: [facility];  Service: Ophthalmology;  Laterality: Left;  Diabetic - oral meds   CATARACT EXTRACTION W/PHACO Right 07/11/2020   Procedure: CATARACT EXTRACTION PHACO AND INTRAOCULAR LENS PLACEMENT (IOC) RIGHT DIABETIC;  Surgeon: Mittie Gaskin, MD;  Location: [facility];  Service: Ophthalmology;  Laterality:  Right;  9.28 1:15.4 12.3%   COLONOSCOPY WITH PROPOFOL  N/A 09/28/2015   Procedure: COLONOSCOPY WITH PROPOFOL ;  Surgeon: Lamar ONEIDA Holmes, MD;  Location: [facility];  Service: Endoscopy;  Laterality: N/A;   CORONARY ANGIOPLASTY     Stent placement x 3 (1999, [id number])   TONSILLECTOMY     Patient Active Problem List   Diagnosis Date Noted   Bilateral carpal tunnel syndrome 06/16/2023   Class 2 severe obesity due to excess calories with serious comorbidity and body mass index (BMI) of 37.0 to 37.9 in adult (HCC) 03/12/2023   Pain due to onychomycosis of toenails of both feet 05/05/2022   Coronary atherosclerosis 02/13/2020   Hypercholesterolemia 02/13/2020   Localized edema 02/13/2020   Proteinuria 02/13/2020   Abdominal aortic aneurysm (HCC) 02/13/2020   Angina pectoris (HCC) 02/13/2020   Atrial fibrillation (HCC) 02/13/2020   Cardiomyopathy (HCC) 02/13/2020   Edema of lower extremity 08/08/2019   Essential hypertension 08/08/2019   Macular degeneration 10/20/2018   Bleeding hemorrhoids 02/09/2017   CKD (chronic kidney disease) stage 3, GFR 30-59 ml/min (HCC) 01/04/2016   Type 2 diabetes mellitus (HCC) 09/18/2014    ONSET DATE: 3-4 years progressive   REFERRING DIAG: R29.898 (ICD-10-CM) - Leg weakness, bilateral   THERAPY DIAG:  No diagnosis found.  Rationale for  Evaluation and Treatment: Rehabilitation  SUBJECTIVE:                                                                                                                                                                                             SUBJECTIVE STATEMENT: Pt reports R LE is very weak. He has noticed grinding in the knee and difficulty getting in and out of chairs. Lower height chairs are a particular challenge. He reports in the last 6 months his weakness and imbalance have been more profound. Pt reports there are occasions that people feel the need to help him. Pt wife has been coming into clinic for PT as  well for about a month. Pt had one fall in the last 6 months when he tripped over a stool but fell onto the bed. Pt not in pain at the moment but has low back pain that comes and goes. Pt had previous shot in R knee and was told he did not nee surgery and th epain was relieved.  Pt accompanied by: self  PERTINENT HISTORY: PMX: OA, Cancer, DM, HTN  PAIN:  Are you having pain? No  PRECAUTIONS: None  RED FLAGS: None   WEIGHT BEARING RESTRICTIONS: No  FALLS: Has patient fallen in last 6 months? Yes. Number of falls 1  LIVING ENVIRONMENT: Lives with: lives with their spouse Lives in: House/apartment Stairs: Yes: External: 3 steps; can reach both Has following equipment at home: trecking pole   PLOF: Independent with household mobility with device and Independent with community mobility with device  PATIENT GOALS: Improve balance and strength   OBJECTIVE:   Note: Objective measures were completed at Evaluation unless otherwise noted.  DIAGNOSTIC FINDINGS: n/a  COGNITION: Overall cognitive status: Within functional limits for tasks assessed   SENSATION: Not tested    EDEMA:  Grade 1-2 Pitting edema noted on B Les, worse on R > L      POSTURE: rounded shoulders and forward head  LOWER EXTREMITY ROM:     Active  Right Eval Left Eval  Hip flexion    Hip extension    Hip abduction    Hip adduction    Hip internal rotation    Hip external rotation    Knee flexion 95 degrees   Knee extension    Ankle dorsiflexion    Ankle plantarflexion    Ankle inversion    Ankle eversion     (Blank rows = not tested)  LOWER EXTREMITY MMT:    MMT Right Eval Left Eval  Hip flexion 4 4  Hip extension    Hip abduction 4+ 4+  Hip  adduction 4+ 4+  Hip internal rotation    Hip external rotation    Knee flexion 4 4  Knee extension 4 4  Ankle dorsiflexion 4+ 4+  Ankle plantarflexion    Ankle inversion    Ankle eversion    (Blank rows = not tested)  BED MOBILITY:  No  reported difficulty  TRANSFERS: Sit to stand: Modified independence  Assistive device utilized: UE support      Stand to sit: Modified independence  Assistive device utilized: heavy UE      Chair to chair: Modified independence  Assistive device utilized: heavy UE        STAIRS: Not tested GAIT: Findings: Gait Characteristics: poor foot clearance- Right and poor foot clearance- Left, Distance walked: 30 ft, and Comments:    FUNCTIONAL TESTS:  5 times sit to stand: 32.05 sec heavy UE Timed up and go (TUG): 17.26 sec no AD : 11.36 sec ( .88 m/s)  BERG: test visit 2   PATIENT SURVEYS:  ABC scale: The Activities-Specific Balance Confidence (ABC) Scale 0% 10 20 30  40 50 60 70 80 90 100% No confidence<->completely confident  "How confident are you that you will not lose your balance or become unsteady when you . . .   Date tested 01/26/24  Walk around the house 100%  2. Walk up or down stairs 70%  3. Bend over and pick up a slipper from in front of a closet floor 80%  4. Reach for a small can off a shelf at eye level 100%  5. Stand on tip toes and reach for something above your head 80%  6. Stand on a chair and reach for something 40%  7. Sweep the floor 90%  8. Walk outside the house to a car parked in the driveway 100%  9. Get into or out of a car 90%  10. Walk across a parking lot to the mall 100%  11. Walk up or down a ramp 100%  12. Walk in a crowded mall where people rapidly walk past you 90%  13. Are bumped into by people as you walk through the mall 80%  14. Step onto or off of an escalator while you are holding onto the railing 90%  15. Step onto or off an escalator while holding onto parcels such that you cannot hold onto the railing 70%  16. Walk outside on icy sidewalks 80%  Total: #/16 85%                                                                                                                                 TREATMENT DATE: 01/26/24   SELF  CARE Patient instructed in plan of care, findings for evaluation, and ways of physical therapy may improve their function and quality of life.     PATIENT EDUCATION: Education details: POC Person educated: Patient Education method: Explanation Education comprehension: verbalized understanding   HOME EXERCISE PROGRAM:  Establish visit 2    GOALS: Goals reviewed with patient? Yes  SHORT TERM GOALS: Target date: 02/23/2024      Patient will be independent in home exercise program to improve strength/mobility for better functional independence with ADLs. Baseline: No HEP currently  Goal status: INITIAL   LONG TERM GOALS: Target date: 04/19/2024    1.  Patient will complete five times sit to stand test in < 15 seconds indicating an increased LE strength and improved balance. Baseline: 32.05 sec heavy UE  Goal status: INITIAL  2.  Patient will improve ABC score to 90%   to demonstrate statistically significant improvement in mobility and quality of life as it relates to their balance.  Baseline: 85% Goal status: INITIAL   3.  Patient will increase Berg Balance score by > 6 points to demonstrate decreased fall risk during functional activities. Baseline: test visit 2  Goal status: INITIAL   4.   Patient will reduce timed up and go to <11 seconds to reduce fall risk and demonstrate improved transfer/gait ability. Baseline: 17.26 sec Goal status: INITIAL  5.   Patient will increase 10 meter walk test to >1.19m/s as to improve gait speed for better community ambulation and to reduce fall risk. Baseline: .86 m/s Goal status: INITIAL  6.  Patient will improve right knee range of motion to 110 degrees or greater in active flexion in order to allow him to improve right lower extremity ability to assist with various transitions including squats and sit to stands.  Baseline: 95 degrees active  Goal status: INITIAL    ASSESSMENT:  CLINICAL IMPRESSION: Patient is an  83 year old male who presents to physical therapy for evaluation and treatment of balance impairments and lower extremity strength impairments.  Patient reports his weakness has been progressive over the last several years but has been more noticeable in the last 6 months.  Patient has significant difficulty transitioning from sit to stand and requires upper extremity assist heavily perform this transition.  Part of what is likely making this difficult his restricted knee range of motion in the right lower extremity and inability to utilize this and lower depth chairs causing him to rely on his left lower extremity for propulsion.  In addition patient has general strength deficits in bilateral lower extremities that can be addressed to improve his overall strength.  Will continue testing patient's balance in future session but overall patient does have some balance deficits creating increased risk factor for falls which is likely largely based from bilateral lower extremity weakness.  Patient will benefit from skilled physical therapy to address the above impairments and to address the newly established therapy goals.  OBJECTIVE IMPAIRMENTS: Abnormal gait, decreased activity tolerance, decreased balance, decreased endurance, decreased mobility, difficulty walking, and decreased strength.   ACTIVITY LIMITATIONS: standing, squatting, stairs, and transfers  PARTICIPATION LIMITATIONS: shopping and community activity  PERSONAL FACTORS: Behavior pattern and 3+ comorbidities:  PMX: OA, Cancer, DM, HTN are also affecting patient's functional outcome.   REHAB POTENTIAL: Good  CLINICAL DECISION MAKING: Evolving/moderate complexity  EVALUATION COMPLEXITY: Moderate  PLAN:  PT FREQUENCY: 2x/week  PT DURATION: 12 weeks  PLANNED INTERVENTIONS: [id number]- Physical Performance Testing, 97110-Therapeutic exercises, 97530- Therapeutic activity, 97112- Neuromuscular re-education, 97535- Self Care, [id number]- Manual  therapy, [phone]- Gait training, Balance training, Stair training, Joint mobilization, Joint manipulation, Cryotherapy, and Moist heat  PLAN FOR NEXT SESSION: BERG, knee ROM on R, general strength and balance    Lonni KATHEE Gainer, PT 01/26/2024, 5:28 PM

## 2024-01-27 ENCOUNTER — Ambulatory Visit

## 2024-01-27 DIAGNOSIS — M6281 Muscle weakness (generalized): Secondary | ICD-10-CM

## 2024-01-27 DIAGNOSIS — R2689 Other abnormalities of gait and mobility: Secondary | ICD-10-CM

## 2024-01-27 DIAGNOSIS — R2681 Unsteadiness on feet: Secondary | ICD-10-CM

## 2024-01-27 DIAGNOSIS — R269 Unspecified abnormalities of gait and mobility: Secondary | ICD-10-CM | POA: Diagnosis not present

## 2024-01-27 DIAGNOSIS — R262 Difficulty in walking, not elsewhere classified: Secondary | ICD-10-CM

## 2024-01-27 NOTE — Therapy (Signed)
 OUTPATIENT PHYSICAL THERAPY NEURO EVALUATION   Patient Name: Glen Jenkins MRN: [id number] DOB:03-Oct-1940, 83 y.o., male Today's Date: 01/27/2024   PCP:   Auston Reyes BIRCH, MD   REFERRING PROVIDER:   Auston Reyes BIRCH, MD    END OF SESSION:  PT End of Session - 01/27/24 1103     Visit Number 2    Number of Visits 24    Date for PT Re-Evaluation 04/19/24    Progress Note Due on Visit 10    PT Start Time 1058    PT Stop Time 1144    PT Time Calculation (min) 46 min    Equipment Utilized During Treatment Gait belt    Activity Tolerance Patient tolerated treatment well    Behavior During Therapy WFL for tasks assessed/performed          Past Medical History:  Diagnosis Date   AAA (abdominal aortic aneurysm) (HCC)    Anginal pain (HCC)    Arthritis    lower back   Cancer (HCC)    Lung Cancer; Squamous Cell Carcinoma   Cardiomyopathy (HCC)    Coronary artery disease    CTS (carpal tunnel syndrome)    right - numbness thumb and 1st and 2nd fingers   Diabetes mellitus without complication (HCC)    Dysrhythmia    Atrial Fibrillation   GERD (gastroesophageal reflux disease)    Hypercholesterolemia    Hypertension    Nephropathy, diabetic (HCC)    Onychomycosis    Renal insufficiency    Sleep apnea    Varicose veins    Wears dentures    partial upper   Wears hearing aid in both ears    Past Surgical History:  Procedure Laterality Date   CATARACT EXTRACTION W/PHACO Left 06/27/2020   Procedure: CATARACT EXTRACTION PHACO AND INTRAOCULAR LENS PLACEMENT (IOC) LEFT DIABETIC 13.57 01:47.8 12.6%;  Surgeon: Mittie Gaskin, MD;  Location: [facility];  Service: Ophthalmology;  Laterality: Left;  Diabetic - oral meds   CATARACT EXTRACTION W/PHACO Right 07/11/2020   Procedure: CATARACT EXTRACTION PHACO AND INTRAOCULAR LENS PLACEMENT (IOC) RIGHT DIABETIC;  Surgeon: Mittie Gaskin, MD;  Location: [facility];  Service: Ophthalmology;  Laterality:  Right;  9.28 1:15.4 12.3%   COLONOSCOPY WITH PROPOFOL  N/A 09/28/2015   Procedure: COLONOSCOPY WITH PROPOFOL ;  Surgeon: Lamar ONEIDA Holmes, MD;  Location: [facility];  Service: Endoscopy;  Laterality: N/A;   CORONARY ANGIOPLASTY     Stent placement x 3 (1999, [id number])   TONSILLECTOMY     Patient Active Problem List   Diagnosis Date Noted   Bilateral carpal tunnel syndrome 06/16/2023   Class 2 severe obesity due to excess calories with serious comorbidity and body mass index (BMI) of 37.0 to 37.9 in adult (HCC) 03/12/2023   Pain due to onychomycosis of toenails of both feet 05/05/2022   Coronary atherosclerosis 02/13/2020   Hypercholesterolemia 02/13/2020   Localized edema 02/13/2020   Proteinuria 02/13/2020   Abdominal aortic aneurysm (HCC) 02/13/2020   Angina pectoris (HCC) 02/13/2020   Atrial fibrillation (HCC) 02/13/2020   Cardiomyopathy (HCC) 02/13/2020   Edema of lower extremity 08/08/2019   Essential hypertension 08/08/2019   Macular degeneration 10/20/2018   Bleeding hemorrhoids 02/09/2017   CKD (chronic kidney disease) stage 3, GFR 30-59 ml/min (HCC) 01/04/2016   Type 2 diabetes mellitus (HCC) 09/18/2014    ONSET DATE: 3-4 years progressive   REFERRING DIAG: R29.898 (ICD-10-CM) - Leg weakness, bilateral   THERAPY DIAG:  Abnormality of gait and mobility  Difficulty in walking, not elsewhere classified  Other abnormalities of gait and mobility  Unsteadiness on feet  Muscle weakness (generalized)  Rationale for Evaluation and Treatment: Rehabilitation  SUBJECTIVE:                                                                                                                                                                                             SUBJECTIVE STATEMENT: Pt reports R LE is very weak. He has noticed grinding in the knee and difficulty getting in and out of chairs. Lower height chairs are a particular challenge. He reports in the last 6 months  his weakness and imbalance have been more profound. Pt reports there are occasions that people feel the need to help him. Pt wife has been coming into clinic for PT as well for about a month. Pt had one fall in the last 6 months when he tripped over a stool but fell onto the bed. Pt not in pain at the moment but has low back pain that comes and goes. Pt had previous shot in R knee and was told he did not nee surgery and th epain was relieved.  Pt accompanied by: self  PERTINENT HISTORY: PMX: OA, Cancer, DM, HTN  PAIN:  Are you having pain? No  PRECAUTIONS: None  RED FLAGS: None   WEIGHT BEARING RESTRICTIONS: No  FALLS: Has patient fallen in last 6 months? Yes. Number of falls 1  LIVING ENVIRONMENT: Lives with: lives with their spouse Lives in: House/apartment Stairs: Yes: External: 3 steps; can reach both Has following equipment at home: trecking pole   PLOF: Independent with household mobility with device and Independent with community mobility with device  PATIENT GOALS: Improve balance and strength   OBJECTIVE:   Note: Objective measures were completed at Evaluation unless otherwise noted.  DIAGNOSTIC FINDINGS: n/a  COGNITION: Overall cognitive status: Within functional limits for tasks assessed   SENSATION: Not tested    EDEMA:  Grade 1-2 Pitting edema noted on B Les, worse on R > L      POSTURE: rounded shoulders and forward head  LOWER EXTREMITY ROM:     Active  Right Eval Left Eval  Hip flexion    Hip extension    Hip abduction    Hip adduction    Hip internal rotation    Hip external rotation    Knee flexion 95 degrees   Knee extension    Ankle dorsiflexion    Ankle plantarflexion    Ankle inversion    Ankle eversion     (Blank rows = not tested)  LOWER EXTREMITY MMT:  MMT Right Eval Left Eval  Hip flexion 4 4  Hip extension    Hip abduction 4+ 4+  Hip adduction 4+ 4+  Hip internal rotation    Hip external rotation    Knee  flexion 4 4  Knee extension 4 4  Ankle dorsiflexion 4+ 4+  Ankle plantarflexion    Ankle inversion    Ankle eversion    (Blank rows = not tested)  BED MOBILITY:  No reported difficulty  TRANSFERS: Sit to stand: Modified independence  Assistive device utilized: UE support      Stand to sit: Modified independence  Assistive device utilized: heavy UE      Chair to chair: Modified independence  Assistive device utilized: heavy UE        STAIRS: Not tested GAIT: Findings: Gait Characteristics: poor foot clearance- Right and poor foot clearance- Left, Distance walked: 30 ft, and Comments:    FUNCTIONAL TESTS:  5 times sit to stand: 32.05 sec heavy UE Timed up and go (TUG): 17.26 sec no AD : 11.36 sec ( .88 m/s)  BERG: test visit 2   PATIENT SURVEYS:  ABC scale: The Activities-Specific Balance Confidence (ABC) Scale 0% 10 20 30  40 50 60 70 80 90 100% No confidence<->completely confident  "How confident are you that you will not lose your balance or become unsteady when you . . .   Date tested 01/26/24  Walk around the house 100%  2. Walk up or down stairs 70%  3. Bend over and pick up a slipper from in front of a closet floor 80%  4. Reach for a small can off a shelf at eye level 100%  5. Stand on tip toes and reach for something above your head 80%  6. Stand on a chair and reach for something 40%  7. Sweep the floor 90%  8. Walk outside the house to a car parked in the driveway 100%  9. Get into or out of a car 90%  10. Walk across a parking lot to the mall 100%  11. Walk up or down a ramp 100%  12. Walk in a crowded mall where people rapidly walk past you 90%  13. Are bumped into by people as you walk through the mall 80%  14. Step onto or off of an escalator while you are holding onto the railing 90%  15. Step onto or off an escalator while holding onto parcels such that you cannot hold onto the railing 70%  16. Walk outside on icy sidewalks 80%  Total: #/16 85%                                                                                                                                  TREATMENT DATE: 01/27/24   Self care/Home management: -Instruction in R knee stretching (self)   -Seated knee flex  - Seated knee ext  - Standing Knee flex  -Standing  knee ext  Physical Performance Test or Measurement: a  physical performance test or measurement (eg,  musculoskeletal, functional capacity), with written report,  each 15 mins (Reassessment Test and Measures    [employer] PT Assessment - 01/27/24 1124       Standardized Balance Assessment   Standardized Balance Assessment Berg Balance Test      Berg Balance Test   Sit to Stand Able to stand  independently using hands    Standing Unsupported Able to stand safely 2 minutes    Sitting with Back Unsupported but Feet Supported on Floor or Stool Able to sit safely and securely 2 minutes    Stand to Sit Controls descent by using hands    Transfers Able to transfer safely, definite need of hands    Standing Unsupported with Eyes Closed Able to stand 10 seconds with supervision    Standing Unsupported with Feet Together Able to place feet together independently and stand for 1 minute with supervision    From Standing, Reach Forward with Outstretched Arm Can reach forward >12 cm safely (5)    From Standing Position, Pick up Object from Floor Able to pick up shoe, needs supervision    From Standing Position, Turn to Look Behind Over each Shoulder Looks behind from both sides and weight shifts well    Turn 360 Degrees Able to turn 360 degrees safely one side only in 4 seconds or less    Standing Unsupported, Alternately Place Feet on Step/Stool Able to stand independently and safely and complete 8 steps in 20 seconds    Standing Unsupported, One Foot in Front Able to take small step independently and hold 30 seconds    Standing on One Leg Tries to lift leg/unable to hold 3 seconds but remains standing  independently    Total Score 43          Therapeutic Activities: dynamic therapeutic activities  designed to achieve improved functional performance  - The patient completed 5 minutes at level(s) 1 on the NuStep using both BUE/BLE reciprocal movements to promote strength, coordination, endurance, and cardiorespiratory fitness. PT increased the resistance level and monitored the patient's response to the intervention throughout. The patient required min cueing for technique and SBA level of assistance.    PATIENT EDUCATION: Education details: POC Person educated: Patient Education method: Explanation Education comprehension: verbalized understanding   HOME EXERCISE PROGRAM: Access Code: [email or web address] URL: [email or web address] Date: 01/27/2024 Prepared by: Reyes London  Exercises - Seated Knee Flexion Stretch  - 1-2 x daily - 3 sets - 30 sec hold - Seated Hamstring Stretch  - 1-2 x daily - 3 sets - 30 hold - Standing Knee Flexion Stretch on Step  - 1-2 x daily - 3 sets - 30 sec hold - Standing Hamstring Stretch with Step  - 1-2 x daily - 3 sets - 30 hold    GOALS: Goals reviewed with patient? Yes  SHORT TERM GOALS: Target date: 02/23/2024      Patient will be independent in home exercise program to improve strength/mobility for better functional independence with ADLs. Baseline: No HEP currently  Goal status: INITIAL   LONG TERM GOALS: Target date: 04/19/2024    1.  Patient will complete five times sit to stand test in < 15 seconds indicating an increased LE strength and improved balance. Baseline: 32.05 sec heavy UE  Goal status: INITIAL  2.  Patient will improve ABC score to 90%   to demonstrate statistically significant improvement in mobility and  quality of life as it relates to their balance.  Baseline: 85% Goal status: INITIAL   3.  Patient will increase Berg Balance score by > 6 points to demonstrate decreased fall risk during functional  activities. Baseline: test visit 2; 01/27/2024= 43/56 Goal status: INITIAL   4.   Patient will reduce timed up and go to <11 seconds to reduce fall risk and demonstrate improved transfer/gait ability. Baseline: 17.26 sec Goal status: INITIAL  5.   Patient will increase 10 meter walk test to >1.36m/s as to improve gait speed for better community ambulation and to reduce fall risk. Baseline: .86 m/s Goal status: INITIAL  6.  Patient will improve right knee range of motion to 110 degrees or greater in active flexion in order to allow him to improve right lower extremity ability to assist with various transitions including squats and sit to stands.  Baseline: 95 degrees active  Goal status: INITIAL    ASSESSMENT:  CLINICAL IMPRESSION: Patient is an 83 year old male who presents to physical therapy for  treatment of balance impairments and lower extremity strength impairments.  Patient presents with RLE stiffness and imbalance. He scored 43/56 on BERG indicating increased risk of falling. He responded well to instructions for some knee ROM that can be done at home and added to HEP. Patient will benefit from skilled physical therapy to address the above impairments and to address the newly established therapy goals.  OBJECTIVE IMPAIRMENTS: Abnormal gait, decreased activity tolerance, decreased balance, decreased endurance, decreased mobility, difficulty walking, and decreased strength.   ACTIVITY LIMITATIONS: standing, squatting, stairs, and transfers  PARTICIPATION LIMITATIONS: shopping and community activity  PERSONAL FACTORS: Behavior pattern and 3+ comorbidities:  PMX: OA, Cancer, DM, HTN are also affecting patient's functional outcome.   REHAB POTENTIAL: Good  CLINICAL DECISION MAKING: Evolving/moderate complexity  EVALUATION COMPLEXITY: Moderate  PLAN:  PT FREQUENCY: 2x/week  PT DURATION: 12 weeks  PLANNED INTERVENTIONS: 97750- Physical Performance Testing,  97110-Therapeutic exercises, 97530- Therapeutic activity, 97112- Neuromuscular re-education, 97535- Self Care, [id number]- Manual therapy, [id number]- Gait training, Balance training, Stair training, Joint mobilization, Joint manipulation, Cryotherapy, and Moist heat  PLAN FOR NEXT SESSION: Continue with knee ROM on R\  General LE strength and balance training - both static and dynamic   Reyes LOISE London, PT 01/27/2024, 12:06 PM

## 2024-02-03 ENCOUNTER — Ambulatory Visit: Attending: Internal Medicine | Admitting: Physical Therapy

## 2024-02-03 DIAGNOSIS — M6281 Muscle weakness (generalized): Secondary | ICD-10-CM | POA: Insufficient documentation

## 2024-02-03 DIAGNOSIS — R2681 Unsteadiness on feet: Secondary | ICD-10-CM | POA: Insufficient documentation

## 2024-02-03 DIAGNOSIS — R269 Unspecified abnormalities of gait and mobility: Secondary | ICD-10-CM | POA: Insufficient documentation

## 2024-02-03 DIAGNOSIS — R262 Difficulty in walking, not elsewhere classified: Secondary | ICD-10-CM | POA: Insufficient documentation

## 2024-02-03 DIAGNOSIS — R2689 Other abnormalities of gait and mobility: Secondary | ICD-10-CM | POA: Diagnosis present

## 2024-02-03 NOTE — Therapy (Unsigned)
 OUTPATIENT PHYSICAL THERAPY NEURO TREATMENT    Patient Name: Glen Jenkins MRN: [id number] DOB:Apr 10, 1941, 83 y.o., male Today's Date: 02/03/2024   PCP:   Auston Reyes BIRCH, MD   REFERRING PROVIDER:   Auston Reyes BIRCH, MD    END OF SESSION:  PT End of Session - 02/03/24 1619     Visit Number 3    Number of Visits 24    Date for PT Re-Evaluation 04/19/24    Progress Note Due on Visit 10    PT Start Time 1620    PT Stop Time 1700    PT Time Calculation (min) 40 min    Equipment Utilized During Treatment Gait belt    Activity Tolerance Patient tolerated treatment well    Behavior During Therapy WFL for tasks assessed/performed          Past Medical History:  Diagnosis Date   AAA (abdominal aortic aneurysm) (HCC)    Anginal pain (HCC)    Arthritis    lower back   Cancer (HCC)    Lung Cancer; Squamous Cell Carcinoma   Cardiomyopathy (HCC)    Coronary artery disease    CTS (carpal tunnel syndrome)    right - numbness thumb and 1st and 2nd fingers   Diabetes mellitus without complication (HCC)    Dysrhythmia    Atrial Fibrillation   GERD (gastroesophageal reflux disease)    Hypercholesterolemia    Hypertension    Nephropathy, diabetic (HCC)    Onychomycosis    Renal insufficiency    Sleep apnea    Varicose veins    Wears dentures    partial upper   Wears hearing aid in both ears    Past Surgical History:  Procedure Laterality Date   CATARACT EXTRACTION W/PHACO Left 06/27/2020   Procedure: CATARACT EXTRACTION PHACO AND INTRAOCULAR LENS PLACEMENT (IOC) LEFT DIABETIC 13.57 01:47.8 12.6%;  Surgeon: Mittie Gaskin, MD;  Location: [facility];  Service: Ophthalmology;  Laterality: Left;  Diabetic - oral meds   CATARACT EXTRACTION W/PHACO Right 07/11/2020   Procedure: CATARACT EXTRACTION PHACO AND INTRAOCULAR LENS PLACEMENT (IOC) RIGHT DIABETIC;  Surgeon: Mittie Gaskin, MD;  Location: [facility];  Service: Ophthalmology;  Laterality:  Right;  9.28 1:15.4 12.3%   COLONOSCOPY WITH PROPOFOL  N/A 09/28/2015   Procedure: COLONOSCOPY WITH PROPOFOL ;  Surgeon: Lamar ONEIDA Holmes, MD;  Location: [facility];  Service: Endoscopy;  Laterality: N/A;   CORONARY ANGIOPLASTY     Stent placement x 3 (1999, 7994,7987)   TONSILLECTOMY     Patient Active Problem List   Diagnosis Date Noted   Bilateral carpal tunnel syndrome 06/16/2023   Class 2 severe obesity due to excess calories with serious comorbidity and body mass index (BMI) of 37.0 to 37.9 in adult (HCC) 03/12/2023   Pain due to onychomycosis of toenails of both feet 05/05/2022   Coronary atherosclerosis 02/13/2020   Hypercholesterolemia 02/13/2020   Localized edema 02/13/2020   Proteinuria 02/13/2020   Abdominal aortic aneurysm (HCC) 02/13/2020   Angina pectoris (HCC) 02/13/2020   Atrial fibrillation (HCC) 02/13/2020   Cardiomyopathy (HCC) 02/13/2020   Edema of lower extremity 08/08/2019   Essential hypertension 08/08/2019   Macular degeneration 10/20/2018   Bleeding hemorrhoids 02/09/2017   CKD (chronic kidney disease) stage 3, GFR 30-59 ml/min (HCC) 01/04/2016   Type 2 diabetes mellitus (HCC) 09/18/2014    ONSET DATE: 3-4 years progressive   REFERRING DIAG: R29.898 (ICD-10-CM) - Leg weakness, bilateral   THERAPY DIAG:  Difficulty in walking, not elsewhere  classified  Abnormality of gait and mobility  Other abnormalities of gait and mobility  Unsteadiness on feet  Muscle weakness (generalized)  Rationale for Evaluation and Treatment: Rehabilitation  SUBJECTIVE:                                                                                                                                                                                             SUBJECTIVE STATEMENT: Pt reports that he is doing well; has a little bit of knee pain. Was able to perform self stretching HEP provided in last PT session yesterday. Had a hectic weekend catching up on paying bills  and other things around the house.   Pt accompanied by: self  PERTINENT HISTORY: PMX: OA, Cancer, DM, HTN  PAIN:  Are you having pain? No  PRECAUTIONS: None  RED FLAGS: None   WEIGHT BEARING RESTRICTIONS: No  FALLS: Has patient fallen in last 6 months? Yes. Number of falls 1  LIVING ENVIRONMENT: Lives with: lives with their spouse Lives in: House/apartment Stairs: Yes: External: 3 steps; can reach both Has following equipment at home: trecking pole   PLOF: Independent with household mobility with device and Independent with community mobility with device  PATIENT GOALS: Improve balance and strength   OBJECTIVE:   Note: Objective measures were completed at Evaluation unless otherwise noted.  DIAGNOSTIC FINDINGS: n/a  COGNITION: Overall cognitive status: Within functional limits for tasks assessed   SENSATION: Not tested    EDEMA:  Grade 1-2 Pitting edema noted on B Les, worse on R > L      POSTURE: rounded shoulders and forward head  LOWER EXTREMITY ROM:     Active  Right Eval Left Eval  Hip flexion    Hip extension    Hip abduction    Hip adduction    Hip internal rotation    Hip external rotation    Knee flexion 95 degrees   Knee extension    Ankle dorsiflexion    Ankle plantarflexion    Ankle inversion    Ankle eversion     (Blank rows = not tested)  LOWER EXTREMITY MMT:    MMT Right Eval Left Eval  Hip flexion 4 4  Hip extension    Hip abduction 4+ 4+  Hip adduction 4+ 4+  Hip internal rotation    Hip external rotation    Knee flexion 4 4  Knee extension 4 4  Ankle dorsiflexion 4+ 4+  Ankle plantarflexion    Ankle inversion    Ankle eversion    (Blank rows = not tested)  BED MOBILITY:  No reported difficulty  TRANSFERS:  Sit to stand: Modified independence  Assistive device utilized: UE support      Stand to sit: Modified independence  Assistive device utilized: heavy UE      Chair to chair: Modified independence   Assistive device utilized: heavy UE        STAIRS: Not tested GAIT: Findings: Gait Characteristics: poor foot clearance- Right and poor foot clearance- Left, Distance walked: 30 ft, and Comments:    FUNCTIONAL TESTS:  5 times sit to stand: 32.05 sec heavy UE Timed up and go (TUG): 17.26 sec no AD : 11.36 sec ( .88 m/s)  BERG: test visit 2   PATIENT SURVEYS:  ABC scale: The Activities-Specific Balance Confidence (ABC) Scale 0% 10 20 30  40 50 60 70 80 90 100% No confidence<->completely confident  "How confident are you that you will not lose your balance or become unsteady when you . . .   Date tested 01/26/24  Walk around the house 100%  2. Walk up or down stairs 70%  3. Bend over and pick up a slipper from in front of a closet floor 80%  4. Reach for a small can off a shelf at eye level 100%  5. Stand on tip toes and reach for something above your head 80%  6. Stand on a chair and reach for something 40%  7. Sweep the floor 90%  8. Walk outside the house to a car parked in the driveway 100%  9. Get into or out of a car 90%  10. Walk across a parking lot to the mall 100%  11. Walk up or down a ramp 100%  12. Walk in a crowded mall where people rapidly walk past you 90%  13. Are bumped into by people as you walk through the mall 80%  14. Step onto or off of an escalator while you are holding onto the railing 90%  15. Step onto or off an escalator while holding onto parcels such that you cannot hold onto the railing 70%  16. Walk outside on icy sidewalks 80%  Total: #/16 85%                                                                                                                                 TREATMENT DATE: 02/03/24   Review HEP   -Seated knee flex 30 sec bil   - Seated knee ext 30 sec bil   - Standing Knee flex 30 sec bil   -Standing knee ext 30 sec bil  - Standing calf stretch on step 30 sec bil    Sit<>stand x 6 with light UE support   Reciprocal foot  tap on 6 inch step with light UE support x 12 bil  Lateral foot tap on 6 inch step with light UE support   Seated therex:  Hip abduction RTB x12; GTB x 10  Hip flexion GTB x 12     Physical Performance  Test or Measurement: a  physical performance test or measurement (eg,  musculoskeletal, functional capacity), with written report,  each 15 mins (Reassessment Test and Measures      Therapeutic Activities: dynamic therapeutic activities  designed to achieve improved functional performance  - The patient completed 5 minutes at level(s) 1 on the NuStep using both BUE/BLE reciprocal movements to promote strength, coordination, endurance, and cardiorespiratory fitness. PT increased the resistance level and monitored the patient's response to the intervention throughout. The patient required min cueing for technique and SBA level of assistance.    PATIENT EDUCATION: Education details: POC Person educated: Patient Education method: Explanation Education comprehension: verbalized understanding   HOME EXERCISE PROGRAM: Access Code: [email or web address] URL: [email or web address] Date: 01/27/2024 Prepared by: Reyes London  Exercises - Seated Knee Flexion Stretch  - 1-2 x daily - 3 sets - 30 sec hold - Seated Hamstring Stretch  - 1-2 x daily - 3 sets - 30 hold - Standing Knee Flexion Stretch on Step  - 1-2 x daily - 3 sets - 30 sec hold - Standing Hamstring Stretch with Step  - 1-2 x daily - 3 sets - 30 hold    GOALS: Goals reviewed with patient? Yes  SHORT TERM GOALS: Target date: 02/23/2024      Patient will be independent in home exercise program to improve strength/mobility for better functional independence with ADLs. Baseline: No HEP currently  Goal status: INITIAL   LONG TERM GOALS: Target date: 04/19/2024    1.  Patient will complete five times sit to stand test in < 15 seconds indicating an increased LE strength and improved balance. Baseline: 32.05 sec  heavy UE  Goal status: INITIAL  2.  Patient will improve ABC score to 90%   to demonstrate statistically significant improvement in mobility and quality of life as it relates to their balance.  Baseline: 85% Goal status: INITIAL   3.  Patient will increase Berg Balance score by > 6 points to demonstrate decreased fall risk during functional activities. Baseline: test visit 2; 01/27/2024= 43/56 Goal status: INITIAL   4.   Patient will reduce timed up and go to <11 seconds to reduce fall risk and demonstrate improved transfer/gait ability. Baseline: 17.26 sec Goal status: INITIAL  5.   Patient will increase 10 meter walk test to >1.75m/s as to improve gait speed for better community ambulation and to reduce fall risk. Baseline: .86 m/s Goal status: INITIAL  6.  Patient will improve right knee range of motion to 110 degrees or greater in active flexion in order to allow him to improve right lower extremity ability to assist with various transitions including squats and sit to stands.  Baseline: 95 degrees active  Goal status: INITIAL    ASSESSMENT:  CLINICAL IMPRESSION: Patient is an 83 year old male who presents to physical therapy for  treatment of balance impairments and lower extremity strength impairments.  Patient presents with RLE stiffness and imbalance. He scored 43/56 on BERG indicating increased risk of falling. He responded well to instructions for some knee ROM that can be done at home and added to HEP. Patient will benefit from skilled physical therapy to address the above impairments and to address the newly established therapy goals.  OBJECTIVE IMPAIRMENTS: Abnormal gait, decreased activity tolerance, decreased balance, decreased endurance, decreased mobility, difficulty walking, and decreased strength.   ACTIVITY LIMITATIONS: standing, squatting, stairs, and transfers  PARTICIPATION LIMITATIONS: shopping and community activity  PERSONAL FACTORS: Behavior pattern and  3+ comorbidities:  PMX: OA,  Cancer, DM, HTN are also affecting patient's functional outcome.   REHAB POTENTIAL: Good  CLINICAL DECISION MAKING: Evolving/moderate complexity  EVALUATION COMPLEXITY: Moderate  PLAN:  PT FREQUENCY: 2x/week  PT DURATION: 12 weeks  PLANNED INTERVENTIONS: 97750- Physical Performance Testing, 97110-Therapeutic exercises, 97530- Therapeutic activity, 97112- Neuromuscular re-education, 97535- Self Care, [id number]- Manual therapy, [phone]- Gait training, Balance training, Stair training, Joint mobilization, Joint manipulation, Cryotherapy, and Moist heat  PLAN FOR NEXT SESSION: Continue with knee ROM on R\  General LE strength and balance training - both static and dynamic   Massie FORBES Dollar, PT 02/03/2024, 4:20 PM

## 2024-02-08 ENCOUNTER — Ambulatory Visit: Admitting: Podiatry

## 2024-02-08 DIAGNOSIS — Z794 Long term (current) use of insulin: Secondary | ICD-10-CM

## 2024-02-08 DIAGNOSIS — L84 Corns and callosities: Secondary | ICD-10-CM

## 2024-02-08 DIAGNOSIS — E119 Type 2 diabetes mellitus without complications: Secondary | ICD-10-CM

## 2024-02-08 DIAGNOSIS — M79674 Pain in right toe(s): Secondary | ICD-10-CM | POA: Diagnosis not present

## 2024-02-08 DIAGNOSIS — M79675 Pain in left toe(s): Secondary | ICD-10-CM | POA: Diagnosis not present

## 2024-02-08 DIAGNOSIS — B351 Tinea unguium: Secondary | ICD-10-CM

## 2024-02-08 NOTE — Progress Notes (Unsigned)
  Subjective:  Patient ID: Glen Jenkins, male    DOB: 1940/12/28,  MRN: [id number]  83 y.o. male presents to clinic with  preventative diabetic foot care for painful mycotic toenails x 10 which interfere with daily activities. Pain is relieved with periodic professional debridement. No chief complaint on file.    New problem(s): None {jgcomplaint:23593}  PCP is Sparks, Reyes BIRCH, MD.  Allergies  Allergen Reactions   Glen Jenkins [Dapagliflozin] Other (See Comments)    My BP dropped tremendously.   Sulfa Antibiotics     Review of Systems: Negative except as noted in the HPI.   Objective:  Glen Jenkins is a pleasant 83 y.o. male {jgbodyhabitus:24098} AAO x 3.  Vascular Examination: Vascular status intact b/l with palpable pedal pulses. Pedal hair sparse. CFT immediate b/l. Trace edema. No pain with calf compression b/l. Skin temperature gradient WNL b/l. {jgvascular:23595}  Neurological Examination: Sensation grossly intact b/l with 10 gram monofilament. Vibratory sensation intact b/l.   Dermatological Examination: Pedal skin with normal turgor, texture and tone b/l. Toenails 1-5 b/l thick, discolored, elongated with subungual debris and pain on dorsal palpation.   Hyperkeratotic lesion(s) submet head 1 right foot, submet head 5 left foot, and submet head 5 right foot.  No erythema, no edema, no drainage, no fluctuance.  Musculoskeletal Examination: Muscle strength 5/5 to b/l LE. Pes planus deformity noted bilateral LE.  Radiographs: None  Assessment:   1. Pain due to onychomycosis of toenails of both feet   2. Callus   3. Type 2 diabetes mellitus without complication, with long-term current use of insulin (HCC)    Plan:  Consent given for treatment. Patient examined. All patient's and/or POA's questions/concerns addressed on today's visit. Mycotic toenails 1-5 debrided in length and girth without incident. Callus(es) submet head 1 right foot, submet head 5 left foot, and  submet head 5 right foot pared with sharp debridement without incident. Continue foot and shoe inspections daily. Monitor blood glucose per PCP/Endocrinologist's recommendations.Continue soft, supportive shoe gear daily. Report any pedal injuries to medical professional. Call office if there are any quesitons/concerns. -Patient/POA to call should there be question/concern in the interim.  No follow-ups on file.  Delon LITTIE Merlin, DPM      [facility] LOCATION: [address] [place] [address]                   Office [phone]   [facility] LOCATION: [address] [place], [place] [address] Office [phone]

## 2024-02-09 ENCOUNTER — Encounter: Payer: Self-pay | Admitting: Podiatry

## 2024-02-10 ENCOUNTER — Ambulatory Visit: Admitting: Physical Therapy

## 2024-02-10 DIAGNOSIS — R269 Unspecified abnormalities of gait and mobility: Secondary | ICD-10-CM

## 2024-02-10 DIAGNOSIS — M6281 Muscle weakness (generalized): Secondary | ICD-10-CM

## 2024-02-10 DIAGNOSIS — R2689 Other abnormalities of gait and mobility: Secondary | ICD-10-CM

## 2024-02-10 DIAGNOSIS — R262 Difficulty in walking, not elsewhere classified: Secondary | ICD-10-CM

## 2024-02-10 DIAGNOSIS — R2681 Unsteadiness on feet: Secondary | ICD-10-CM

## 2024-02-10 NOTE — Therapy (Unsigned)
 OUTPATIENT PHYSICAL THERAPY NEURO TREATMENT    Patient Name: Glen Jenkins MRN: [id number] DOB:04-29-1941, 83 y.o., male Today's Date: 02/10/2024   PCP:   Auston Reyes BIRCH, MD   REFERRING PROVIDER:   Auston Reyes BIRCH, MD    END OF SESSION:  PT End of Session - 02/10/24 1316     Visit Number 4    Number of Visits 24    Date for PT Re-Evaluation 04/19/24    Progress Note Due on Visit 10    PT Start Time 1318    PT Stop Time 1400    PT Time Calculation (min) 42 min    Equipment Utilized During Treatment Gait belt    Activity Tolerance Patient tolerated treatment well    Behavior During Therapy WFL for tasks assessed/performed          Past Medical History:  Diagnosis Date   AAA (abdominal aortic aneurysm) (HCC)    Anginal pain (HCC)    Arthritis    lower back   Cancer (HCC)    Lung Cancer; Squamous Cell Carcinoma   Cardiomyopathy (HCC)    Coronary artery disease    CTS (carpal tunnel syndrome)    right - numbness thumb and 1st and 2nd fingers   Diabetes mellitus without complication (HCC)    Dysrhythmia    Atrial Fibrillation   GERD (gastroesophageal reflux disease)    Hypercholesterolemia    Hypertension    Nephropathy, diabetic (HCC)    Onychomycosis    Renal insufficiency    Sleep apnea    Varicose veins    Wears dentures    partial upper   Wears hearing aid in both ears    Past Surgical History:  Procedure Laterality Date   CATARACT EXTRACTION W/PHACO Left 06/27/2020   Procedure: CATARACT EXTRACTION PHACO AND INTRAOCULAR LENS PLACEMENT (IOC) LEFT DIABETIC 13.57 01:47.8 12.6%;  Surgeon: Mittie Gaskin, MD;  Location: [facility];  Service: Ophthalmology;  Laterality: Left;  Diabetic - oral meds   CATARACT EXTRACTION W/PHACO Right 07/11/2020   Procedure: CATARACT EXTRACTION PHACO AND INTRAOCULAR LENS PLACEMENT (IOC) RIGHT DIABETIC;  Surgeon: Mittie Gaskin, MD;  Location: [facility];  Service: Ophthalmology;  Laterality:  Right;  9.28 1:15.4 12.3%   COLONOSCOPY WITH PROPOFOL  N/A 09/28/2015   Procedure: COLONOSCOPY WITH PROPOFOL ;  Surgeon: Lamar ONEIDA Holmes, MD;  Location: [facility];  Service: Endoscopy;  Laterality: N/A;   CORONARY ANGIOPLASTY     Stent placement x 3 (1999, [id number])   TONSILLECTOMY     Patient Active Problem List   Diagnosis Date Noted   Bilateral carpal tunnel syndrome 06/16/2023   Class 2 severe obesity due to excess calories with serious comorbidity and body mass index (BMI) of 37.0 to 37.9 in adult (HCC) 03/12/2023   Pain due to onychomycosis of toenails of both feet 05/05/2022   Coronary atherosclerosis 02/13/2020   Hypercholesterolemia 02/13/2020   Localized edema 02/13/2020   Proteinuria 02/13/2020   Abdominal aortic aneurysm (HCC) 02/13/2020   Angina pectoris (HCC) 02/13/2020   Atrial fibrillation (HCC) 02/13/2020   Cardiomyopathy (HCC) 02/13/2020   Edema of lower extremity 08/08/2019   Essential hypertension 08/08/2019   Macular degeneration 10/20/2018   Bleeding hemorrhoids 02/09/2017   CKD (chronic kidney disease) stage 3, GFR 30-59 ml/min (HCC) 01/04/2016   Type 2 diabetes mellitus (HCC) 09/18/2014    ONSET DATE: 3-4 years progressive   REFERRING DIAG: R29.898 (ICD-10-CM) - Leg weakness, bilateral   THERAPY DIAG:  Difficulty in walking, not elsewhere  classified  Other abnormalities of gait and mobility  Abnormality of gait and mobility  Unsteadiness on feet  Muscle weakness (generalized)  Rationale for Evaluation and Treatment: Rehabilitation  SUBJECTIVE:                                                                                                                                                                                             SUBJECTIVE STATEMENT: Pt reports that he is doing well; no pain this afternoon. But reports that we was extremely sore over the weekend    Pt accompanied by: self  PERTINENT HISTORY: PMX: OA, Cancer, DM,  HTN  PAIN:  Are you having pain? No  PRECAUTIONS: None  RED FLAGS: None   WEIGHT BEARING RESTRICTIONS: No  FALLS: Has patient fallen in last 6 months? Yes. Number of falls 1  LIVING ENVIRONMENT: Lives with: lives with their spouse Lives in: House/apartment Stairs: Yes: External: 3 steps; can reach both Has following equipment at home: trecking pole   PLOF: Independent with household mobility with device and Independent with community mobility with device  PATIENT GOALS: Improve balance and strength   OBJECTIVE:   Note: Objective measures were completed at Evaluation unless otherwise noted.  DIAGNOSTIC FINDINGS: n/a  COGNITION: Overall cognitive status: Within functional limits for tasks assessed   SENSATION: Not tested    EDEMA:  Grade 1-2 Pitting edema noted on B Les, worse on R > L      POSTURE: rounded shoulders and forward head  LOWER EXTREMITY ROM:     Active  Right Eval Left Eval  Hip flexion    Hip extension    Hip abduction    Hip adduction    Hip internal rotation    Hip external rotation    Knee flexion 95 degrees   Knee extension    Ankle dorsiflexion    Ankle plantarflexion    Ankle inversion    Ankle eversion     (Blank rows = not tested)  LOWER EXTREMITY MMT:    MMT Right Eval Left Eval  Hip flexion 4 4  Hip extension    Hip abduction 4+ 4+  Hip adduction 4+ 4+  Hip internal rotation    Hip external rotation    Knee flexion 4 4  Knee extension 4 4  Ankle dorsiflexion 4+ 4+  Ankle plantarflexion    Ankle inversion    Ankle eversion    (Blank rows = not tested)  BED MOBILITY:  No reported difficulty  TRANSFERS: Sit to stand: Modified independence  Assistive device utilized: UE support      Stand to sit: Modified  independence  Assistive device utilized: heavy UE      Chair to chair: Modified independence  Assistive device utilized: heavy UE        STAIRS: Not tested GAIT: Findings: Gait Characteristics: poor  foot clearance- Right and poor foot clearance- Left, Distance walked: 30 ft, and Comments:    FUNCTIONAL TESTS:  5 times sit to stand: 32.05 sec heavy UE Timed up and go (TUG): 17.26 sec no AD : 11.36 sec ( .88 m/s)  BERG: test visit 2   PATIENT SURVEYS:  ABC scale: The Activities-Specific Balance Confidence (ABC) Scale 0% 10 20 30  40 50 60 70 80 90 100% No confidence<->completely confident  "How confident are you that you will not lose your balance or become unsteady when you . . .   Date tested 01/26/24  Walk around the house 100%  2. Walk up or down stairs 70%  3. Bend over and pick up a slipper from in front of a closet floor 80%  4. Reach for a small can off a shelf at eye level 100%  5. Stand on tip toes and reach for something above your head 80%  6. Stand on a chair and reach for something 40%  7. Sweep the floor 90%  8. Walk outside the house to a car parked in the driveway 100%  9. Get into or out of a car 90%  10. Walk across a parking lot to the mall 100%  11. Walk up or down a ramp 100%  12. Walk in a crowded mall where people rapidly walk past you 90%  13. Are bumped into by people as you walk through the mall 80%  14. Step onto or off of an escalator while you are holding onto the railing 90%  15. Step onto or off an escalator while holding onto parcels such that you cannot hold onto the railing 70%  16. Walk outside on icy sidewalks 80%  Total: #/16 85%                                                                                                                                 TREATMENT DATE: 02/10/24   NuStep using both BUE/BLE reciprocal movements to promote strength, coordination, endurance, and cardiorespiratory fitness. Performed level 1-6 x 7 min. PT increased the resistance level and monitored the patient's response to the intervention throughout.   Gait through rehab department with no UE support x 26ft, 77ft, 29ft + 158ft.   Standing on airex  pad:  Normal BOS x 30 sec  Narrow BOS x 30 sec  Cross body reach to move magnets with narrow BOS x 15 bil.   Sit<>stand 2 x 6 and airex pad in seat. Mild knee pain upon completion of second bout.   Side stepping at rail 64ft x 6 with UE support no UE suprt x 4 laps.  Forward/reverse with UE support on rail x 5 each.  Throughout session, PT provided CGA for safety with gait belt in place to reduce fall risk unless otherwise noted.   PATIENT EDUCATION: Education details: POC. Pt educated throughout session about proper posture and technique with exercises. Improved exercise technique, movement at target joints, use of target muscles after min to mod verbal, visual, tactile cues.  Person educated: Patient Education method: Explanation Education comprehension: verbalized understanding   HOME EXERCISE PROGRAM: Access Code: [email or web address] URL: [email or web address] Date: 01/27/2024 Prepared by: Reyes London  Exercises - Seated Knee Flexion Stretch  - 1-2 x daily - 3 sets - 30 sec hold - Seated Hamstring Stretch  - 1-2 x daily - 3 sets - 30 hold - Standing Knee Flexion Stretch on Step  - 1-2 x daily - 3 sets - 30 sec hold - Standing Hamstring Stretch with Step  - 1-2 x daily - 3 sets - 30 hold    GOALS: Goals reviewed with patient? Yes  SHORT TERM GOALS: Target date: 02/23/2024      Patient will be independent in home exercise program to improve strength/mobility for better functional independence with ADLs. Baseline: No HEP currently  Goal status: INITIAL   LONG TERM GOALS: Target date: 04/19/2024    1.  Patient will complete five times sit to stand test in < 15 seconds indicating an increased LE strength and improved balance. Baseline: 32.05 sec heavy UE  Goal status: INITIAL  2.  Patient will improve ABC score to 90%   to demonstrate statistically significant improvement in mobility and quality of life as it relates to their balance.  Baseline:  85% Goal status: INITIAL   3.  Patient will increase Berg Balance score by > 6 points to demonstrate decreased fall risk during functional activities. Baseline: test visit 2; 01/27/2024= 43/56 Goal status: INITIAL   4.   Patient will reduce timed up and go to <11 seconds to reduce fall risk and demonstrate improved transfer/gait ability. Baseline: 17.26 sec Goal status: INITIAL  5.   Patient will increase 10 meter walk test to >1.4m/s as to improve gait speed for better community ambulation and to reduce fall risk. Baseline: .86 m/s Goal status: INITIAL  6.  Patient will improve right knee range of motion to 110 degrees or greater in active flexion in order to allow him to improve right lower extremity ability to assist with various transitions including squats and sit to stands.  Baseline: 95 degrees active  Goal status: INITIAL    ASSESSMENT:  CLINICAL IMPRESSION: Patient is an 83 year old male who presents to physical therapy for  treatment of balance impairments and lower extremity strength impairments. PT treatment focused on management of balance deficits, as pain levels have improved overall, able to tolerate standing on airex pad with narrow and norma; BOS without overt LOB.   Patient will benefit from skilled physical therapy to address the above impairments and to address the newly established therapy goals.  OBJECTIVE IMPAIRMENTS: Abnormal gait, decreased activity tolerance, decreased balance, decreased endurance, decreased mobility, difficulty walking, and decreased strength.   ACTIVITY LIMITATIONS: standing, squatting, stairs, and transfers  PARTICIPATION LIMITATIONS: shopping and community activity  PERSONAL FACTORS: Behavior pattern and 3+ comorbidities:  PMX: OA, Cancer, DM, HTN are also affecting patient's functional outcome.   REHAB POTENTIAL: Good  CLINICAL DECISION MAKING: Evolving/moderate complexity  EVALUATION COMPLEXITY: Moderate  PLAN:  PT  FREQUENCY: 2x/week  PT DURATION: 12 weeks  PLANNED INTERVENTIONS: 97750- Physical Performance Testing, 97110-Therapeutic exercises, 97530- Therapeutic activity, [id number]- Neuromuscular re-education, 97535- Self  Care, [id number]- Manual therapy, [phone]- Gait training, Balance training, Stair training, Joint mobilization, Joint manipulation, Cryotherapy, and Moist heat  PLAN FOR NEXT SESSION: Continue with knee ROM on R General LE strength and balance training - both static and dynamic   Massie FORBES Dollar, PT 02/10/2024, 1:17 PM

## 2024-02-12 ENCOUNTER — Ambulatory Visit: Admitting: Physical Therapy

## 2024-02-12 DIAGNOSIS — R262 Difficulty in walking, not elsewhere classified: Secondary | ICD-10-CM

## 2024-02-12 DIAGNOSIS — R269 Unspecified abnormalities of gait and mobility: Secondary | ICD-10-CM

## 2024-02-12 DIAGNOSIS — R2681 Unsteadiness on feet: Secondary | ICD-10-CM

## 2024-02-12 DIAGNOSIS — M6281 Muscle weakness (generalized): Secondary | ICD-10-CM

## 2024-02-12 DIAGNOSIS — R2689 Other abnormalities of gait and mobility: Secondary | ICD-10-CM

## 2024-02-12 NOTE — Therapy (Signed)
 OUTPATIENT PHYSICAL THERAPY NEURO TREATMENT    Patient Name: Glen Jenkins MRN: [id number] DOB:13-Apr-1941, 83 y.o., male Today's Date: 02/12/2024   PCP:   Auston Reyes BIRCH, MD   REFERRING PROVIDER:   Auston Reyes BIRCH, MD    END OF SESSION:  PT End of Session - 02/12/24 0804     Visit Number 5    Number of Visits 24    Date for PT Re-Evaluation 04/19/24    Progress Note Due on Visit 10    PT Start Time 0805    PT Stop Time 0845    PT Time Calculation (min) 40 min    Equipment Utilized During Treatment Gait belt    Activity Tolerance Patient tolerated treatment well    Behavior During Therapy WFL for tasks assessed/performed          Past Medical History:  Diagnosis Date   AAA (abdominal aortic aneurysm) (HCC)    Anginal pain (HCC)    Arthritis    lower back   Cancer (HCC)    Lung Cancer; Squamous Cell Carcinoma   Cardiomyopathy (HCC)    Coronary artery disease    CTS (carpal tunnel syndrome)    right - numbness thumb and 1st and 2nd fingers   Diabetes mellitus without complication (HCC)    Dysrhythmia    Atrial Fibrillation   GERD (gastroesophageal reflux disease)    Hypercholesterolemia    Hypertension    Nephropathy, diabetic (HCC)    Onychomycosis    Renal insufficiency    Sleep apnea    Varicose veins    Wears dentures    partial upper   Wears hearing aid in both ears    Past Surgical History:  Procedure Laterality Date   CATARACT EXTRACTION W/PHACO Left 06/27/2020   Procedure: CATARACT EXTRACTION PHACO AND INTRAOCULAR LENS PLACEMENT (IOC) LEFT DIABETIC 13.57 01:47.8 12.6%;  Surgeon: Mittie Gaskin, MD;  Location: [facility];  Service: Ophthalmology;  Laterality: Left;  Diabetic - oral meds   CATARACT EXTRACTION W/PHACO Right 07/11/2020   Procedure: CATARACT EXTRACTION PHACO AND INTRAOCULAR LENS PLACEMENT (IOC) RIGHT DIABETIC;  Surgeon: Mittie Gaskin, MD;  Location: [facility];  Service: Ophthalmology;  Laterality:  Right;  9.28 1:15.4 12.3%   COLONOSCOPY WITH PROPOFOL  N/A 09/28/2015   Procedure: COLONOSCOPY WITH PROPOFOL ;  Surgeon: Lamar ONEIDA Holmes, MD;  Location: [facility];  Service: Endoscopy;  Laterality: N/A;   CORONARY ANGIOPLASTY     Stent placement x 3 (1999, [id number])   TONSILLECTOMY     Patient Active Problem List   Diagnosis Date Noted   Bilateral carpal tunnel syndrome 06/16/2023   Class 2 severe obesity due to excess calories with serious comorbidity and body mass index (BMI) of 37.0 to 37.9 in adult (HCC) 03/12/2023   Pain due to onychomycosis of toenails of both feet 05/05/2022   Coronary atherosclerosis 02/13/2020   Hypercholesterolemia 02/13/2020   Localized edema 02/13/2020   Proteinuria 02/13/2020   Abdominal aortic aneurysm (HCC) 02/13/2020   Angina pectoris (HCC) 02/13/2020   Atrial fibrillation (HCC) 02/13/2020   Cardiomyopathy (HCC) 02/13/2020   Edema of lower extremity 08/08/2019   Essential hypertension 08/08/2019   Macular degeneration 10/20/2018   Bleeding hemorrhoids 02/09/2017   CKD (chronic kidney disease) stage 3, GFR 30-59 ml/min (HCC) 01/04/2016   Type 2 diabetes mellitus (HCC) 09/18/2014    ONSET DATE: 3-4 years progressive   REFERRING DIAG: R29.898 (ICD-10-CM) - Leg weakness, bilateral   THERAPY DIAG:  Difficulty in walking, not elsewhere  classified  Other abnormalities of gait and mobility  Abnormality of gait and mobility  Unsteadiness on feet  Muscle weakness (generalized)  Rationale for Evaluation and Treatment: Rehabilitation  SUBJECTIVE:                                                                                                                                                                                             SUBJECTIVE STATEMENT: Pt reports that he is doing well; no pain this afternoon. But reports that we was extremely sore over the weekend    Pt accompanied by: self  PERTINENT HISTORY: PMX: OA, Cancer, DM,  HTN  PAIN:  Are you having pain? No  PRECAUTIONS: None  RED FLAGS: None   WEIGHT BEARING RESTRICTIONS: No  FALLS: Has patient fallen in last 6 months? Yes. Number of falls 1  LIVING ENVIRONMENT: Lives with: lives with their spouse Lives in: House/apartment Stairs: Yes: External: 3 steps; can reach both Has following equipment at home: trecking pole   PLOF: Independent with household mobility with device and Independent with community mobility with device  PATIENT GOALS: Improve balance and strength   OBJECTIVE:   Note: Objective measures were completed at Evaluation unless otherwise noted.  DIAGNOSTIC FINDINGS: n/a  COGNITION: Overall cognitive status: Within functional limits for tasks assessed   SENSATION: Not tested    EDEMA:  Grade 1-2 Pitting edema noted on B Les, worse on R > L      POSTURE: rounded shoulders and forward head  LOWER EXTREMITY ROM:     Active  Right Eval Left Eval  Hip flexion    Hip extension    Hip abduction    Hip adduction    Hip internal rotation    Hip external rotation    Knee flexion 95 degrees   Knee extension    Ankle dorsiflexion    Ankle plantarflexion    Ankle inversion    Ankle eversion     (Blank rows = not tested)  LOWER EXTREMITY MMT:    MMT Right Eval Left Eval  Hip flexion 4 4  Hip extension    Hip abduction 4+ 4+  Hip adduction 4+ 4+  Hip internal rotation    Hip external rotation    Knee flexion 4 4  Knee extension 4 4  Ankle dorsiflexion 4+ 4+  Ankle plantarflexion    Ankle inversion    Ankle eversion    (Blank rows = not tested)  BED MOBILITY:  No reported difficulty  TRANSFERS: Sit to stand: Modified independence  Assistive device utilized: UE support      Stand to sit: Modified  independence  Assistive device utilized: heavy UE      Chair to chair: Modified independence  Assistive device utilized: heavy UE        STAIRS: Not tested GAIT: Findings: Gait Characteristics: poor  foot clearance- Right and poor foot clearance- Left, Distance walked: 30 ft, and Comments:    FUNCTIONAL TESTS:  5 times sit to stand: 32.05 sec heavy UE Timed up and go (TUG): 17.26 sec no AD : 11.36 sec ( .88 m/s)  BERG: test visit 2   PATIENT SURVEYS:  ABC scale: The Activities-Specific Balance Confidence (ABC) Scale 0% 10 20 30  40 50 60 70 80 90 100% No confidence<->completely confident  "How confident are you that you will not lose your balance or become unsteady when you . . .   Date tested 01/26/24  Walk around the house 100%  2. Walk up or down stairs 70%  3. Bend over and pick up a slipper from in front of a closet floor 80%  4. Reach for a small can off a shelf at eye level 100%  5. Stand on tip toes and reach for something above your head 80%  6. Stand on a chair and reach for something 40%  7. Sweep the floor 90%  8. Walk outside the house to a car parked in the driveway 100%  9. Get into or out of a car 90%  10. Walk across a parking lot to the mall 100%  11. Walk up or down a ramp 100%  12. Walk in a crowded mall where people rapidly walk past you 90%  13. Are bumped into by people as you walk through the mall 80%  14. Step onto or off of an escalator while you are holding onto the railing 90%  15. Step onto or off an escalator while holding onto parcels such that you cannot hold onto the railing 70%  16. Walk outside on icy sidewalks 80%  Total: #/16 85%                                                                                                                                 TREATMENT DATE: 02/12/24   NuStep using both BUE/BLE reciprocal movements to promote strength, coordination, endurance, and cardiorespiratory fitness. Performed level 1-6 on double hill setting x 7 min. PT monitored the patient's response to the intervention throughout.   Seated:  HS stretch 2 x 30 sec  Knee flexion 2 x 30 sec  Standing calf stretch on wedge x 45 sec  Standing  hip flexor stretch on stairs 2 x 25 sec   Standing on airex pad:  Static hold x 30 sec  Eyes open/eyes closed 10sce x 3  1 foot on 6 inch step 2 x 25 sec bil  Reciprocal foot tap on 6inch step x 12 bil   At rail:  Side stepping 6 ft R and L x 5 bil  Forward/reverse  x 6 each for 6 ft   Throughout session pt performed sit<>stand, >10times with supervision assist, but heavy use of bil rails. Was noted to have improved weight shift with increased repetitions ALLOWING INCREASED speed of transfer.   CGA for safety with gait belt in place and CGA for balance training unless otherwise.   PATIENT EDUCATION: Education details: POC. Pt educated throughout session about proper posture and technique with exercises. Improved exercise technique, movement at target joints, use of target muscles after min to mod verbal, visual, tactile cues.  Person educated: Patient Education method: Explanation Education comprehension: verbalized understanding   HOME EXERCISE PROGRAM: Access Code: [email or web address] URL: [email or web address] Date: 01/27/2024 Prepared by: Reyes London  Exercises - Seated Knee Flexion Stretch  - 1-2 x daily - 3 sets - 30 sec hold - Seated Hamstring Stretch  - 1-2 x daily - 3 sets - 30 hold - Standing Knee Flexion Stretch on Step  - 1-2 x daily - 3 sets - 30 sec hold - Standing Hamstring Stretch with Step  - 1-2 x daily - 3 sets - 30 hold    GOALS: Goals reviewed with patient? Yes  SHORT TERM GOALS: Target date: 02/23/2024      Patient will be independent in home exercise program to improve strength/mobility for better functional independence with ADLs. Baseline: No HEP currently  Goal status: INITIAL   LONG TERM GOALS: Target date: 04/19/2024    1.  Patient will complete five times sit to stand test in < 15 seconds indicating an increased LE strength and improved balance. Baseline: 32.05 sec heavy UE  Goal status: INITIAL  2.  Patient will improve  ABC score to 90%   to demonstrate statistically significant improvement in mobility and quality of life as it relates to their balance.  Baseline: 85% Goal status: INITIAL   3.  Patient will increase Berg Balance score by > 6 points to demonstrate decreased fall risk during functional activities. Baseline: test visit 2; 01/27/2024= 43/56 Goal status: INITIAL   4.   Patient will reduce timed up and go to <11 seconds to reduce fall risk and demonstrate improved transfer/gait ability. Baseline: 17.26 sec Goal status: INITIAL  5.   Patient will increase 10 meter walk test to >1.67m/s as to improve gait speed for better community ambulation and to reduce fall risk. Baseline: .86 m/s Goal status: INITIAL  6.  Patient will improve right knee range of motion to 110 degrees or greater in active flexion in order to allow him to improve right lower extremity ability to assist with various transitions including squats and sit to stands.  Baseline: 95 degrees active  Goal status: INITIAL    ASSESSMENT:  CLINICAL IMPRESSION: Patient is an 83 year old male who presents to physical therapy for  treatment of balance impairments and lower extremity strength impairments. PT treatment focused on improved muscle extensibility and ROM to reduce stress on knee and assist with pain management as well as improved balance on unlevel surface and mobility in various planes of motion. No LOB noted, but CGA provided for safety, as pt nervous with novel tasks.   Patient will benefit from skilled physical therapy to address the above impairments and to address the newly established therapy goals.  OBJECTIVE IMPAIRMENTS: Abnormal gait, decreased activity tolerance, decreased balance, decreased endurance, decreased mobility, difficulty walking, and decreased strength.   ACTIVITY LIMITATIONS: standing, squatting, stairs, and transfers  PARTICIPATION LIMITATIONS: shopping and community activity  PERSONAL FACTORS:  Behavior pattern and 3+ comorbidities:  PMX: OA, Cancer, DM, HTN are also affecting patient's functional outcome.   REHAB POTENTIAL: Good  CLINICAL DECISION MAKING: Evolving/moderate complexity  EVALUATION COMPLEXITY: Moderate  PLAN:  PT FREQUENCY: 2x/week  PT DURATION: 12 weeks  PLANNED INTERVENTIONS: 97750- Physical Performance Testing, 97110-Therapeutic exercises, 97530- Therapeutic activity, 97112- Neuromuscular re-education, 97535- Self Care, [id number]- Manual therapy, [phone]- Gait training, Balance training, Stair training, Joint mobilization, Joint manipulation, Cryotherapy, and Moist heat  PLAN FOR NEXT SESSION: Continue with knee ROM on R General LE strength and balance training - both static and dynamic   Massie FORBES Dollar, PT 02/12/2024, 8:04 AM

## 2024-02-15 ENCOUNTER — Ambulatory Visit: Admitting: Physical Therapy

## 2024-02-15 DIAGNOSIS — R2681 Unsteadiness on feet: Secondary | ICD-10-CM

## 2024-02-15 DIAGNOSIS — R262 Difficulty in walking, not elsewhere classified: Secondary | ICD-10-CM

## 2024-02-15 DIAGNOSIS — R269 Unspecified abnormalities of gait and mobility: Secondary | ICD-10-CM

## 2024-02-15 DIAGNOSIS — M6281 Muscle weakness (generalized): Secondary | ICD-10-CM

## 2024-02-15 DIAGNOSIS — R2689 Other abnormalities of gait and mobility: Secondary | ICD-10-CM

## 2024-02-15 NOTE — Therapy (Signed)
 OUTPATIENT PHYSICAL THERAPY NEURO TREATMENT    Patient Name: Glen Jenkins MRN: [id number] DOB:09-Oct-1940, 83 y.o., male Today's Date: 02/15/2024   PCP:   Auston Reyes BIRCH, MD   REFERRING PROVIDER:   Auston Reyes BIRCH, MD    END OF SESSION:   PT End of Session - 02/15/24 1145     Visit Number 6    Number of Visits 24    Date for PT Re-Evaluation 04/19/24    Progress Note Due on Visit 10    PT Start Time 1146    PT Stop Time 1229    PT Time Calculation (min) 43 min    Equipment Utilized During Treatment Gait belt    Activity Tolerance Patient tolerated treatment well    Behavior During Therapy WFL for tasks assessed/performed           Past Medical History:  Diagnosis Date   AAA (abdominal aortic aneurysm) (HCC)    Anginal pain (HCC)    Arthritis    lower back   Cancer (HCC)    Lung Cancer; Squamous Cell Carcinoma   Cardiomyopathy (HCC)    Coronary artery disease    CTS (carpal tunnel syndrome)    right - numbness thumb and 1st and 2nd fingers   Diabetes mellitus without complication (HCC)    Dysrhythmia    Atrial Fibrillation   GERD (gastroesophageal reflux disease)    Hypercholesterolemia    Hypertension    Nephropathy, diabetic (HCC)    Onychomycosis    Renal insufficiency    Sleep apnea    Varicose veins    Wears dentures    partial upper   Wears hearing aid in both ears    Past Surgical History:  Procedure Laterality Date   CATARACT EXTRACTION W/PHACO Left 06/27/2020   Procedure: CATARACT EXTRACTION PHACO AND INTRAOCULAR LENS PLACEMENT (IOC) LEFT DIABETIC 13.57 01:47.8 12.6%;  Surgeon: Mittie Gaskin, MD;  Location: [facility];  Service: Ophthalmology;  Laterality: Left;  Diabetic - oral meds   CATARACT EXTRACTION W/PHACO Right 07/11/2020   Procedure: CATARACT EXTRACTION PHACO AND INTRAOCULAR LENS PLACEMENT (IOC) RIGHT DIABETIC;  Surgeon: Mittie Gaskin, MD;  Location: [facility];  Service: Ophthalmology;  Laterality:  Right;  9.28 1:15.4 12.3%   COLONOSCOPY WITH PROPOFOL  N/A 09/28/2015   Procedure: COLONOSCOPY WITH PROPOFOL ;  Surgeon: Lamar ONEIDA Holmes, MD;  Location: [facility];  Service: Endoscopy;  Laterality: N/A;   CORONARY ANGIOPLASTY     Stent placement x 3 (1999, [id number])   TONSILLECTOMY     Patient Active Problem List   Diagnosis Date Noted   Bilateral carpal tunnel syndrome 06/16/2023   Class 2 severe obesity due to excess calories with serious comorbidity and body mass index (BMI) of 37.0 to 37.9 in adult (HCC) 03/12/2023   Pain due to onychomycosis of toenails of both feet 05/05/2022   Coronary atherosclerosis 02/13/2020   Hypercholesterolemia 02/13/2020   Localized edema 02/13/2020   Proteinuria 02/13/2020   Abdominal aortic aneurysm (HCC) 02/13/2020   Angina pectoris (HCC) 02/13/2020   Atrial fibrillation (HCC) 02/13/2020   Cardiomyopathy (HCC) 02/13/2020   Edema of lower extremity 08/08/2019   Essential hypertension 08/08/2019   Macular degeneration 10/20/2018   Bleeding hemorrhoids 02/09/2017   CKD (chronic kidney disease) stage 3, GFR 30-59 ml/min (HCC) 01/04/2016   Type 2 diabetes mellitus (HCC) 09/18/2014    ONSET DATE: 3-4 years progressive   REFERRING DIAG: R29.898 (ICD-10-CM) - Leg weakness, bilateral   THERAPY DIAG:  Difficulty in walking,  not elsewhere classified  Unsteadiness on feet  Other abnormalities of gait and mobility  Muscle weakness (generalized)  Abnormality of gait and mobility  Rationale for Evaluation and Treatment: Rehabilitation  SUBJECTIVE:                                                                                                                                                                                             SUBJECTIVE STATEMENT:  Pt reports that he is doing okay; pt reports that he has been having cramping in legs, R > L, so he hasn't been doing many stretches. Currently notes cramping in hamstrings; pt reported that  he has been using Theraworx spray & roll-on topical tylenol , which has been helpful.   Pt reported that he feels like he has noticed an improvement in his gait & strength, but still has most difficulty getting up from chairs, especially with lower seat heights. Pt states that he has also noticed an improvement with ROM, as he is able to get in/out of the car easier.    Pt accompanied by: self  PERTINENT HISTORY: PMX: OA, Cancer, DM, HTN  PAIN:  Are you having pain? No  PRECAUTIONS: None  RED FLAGS: None   WEIGHT BEARING RESTRICTIONS: No  FALLS: Has patient fallen in last 6 months? Yes. Number of falls 1  LIVING ENVIRONMENT: Lives with: lives with their spouse Lives in: House/apartment Stairs: Yes: External: 3 steps; can reach both Has following equipment at home: trecking pole   PLOF: Independent with household mobility with device and Independent with community mobility with device  PATIENT GOALS: Improve balance and strength   OBJECTIVE:   Note: Objective measures were completed at Evaluation unless otherwise noted.  DIAGNOSTIC FINDINGS: n/a  COGNITION: Overall cognitive status: Within functional limits for tasks assessed   SENSATION: Not tested    EDEMA:  Grade 1-2 Pitting edema noted on B Les, worse on R > L      POSTURE: rounded shoulders and forward head  LOWER EXTREMITY ROM:     Active  Right Eval Left Eval  Hip flexion    Hip extension    Hip abduction    Hip adduction    Hip internal rotation    Hip external rotation    Knee flexion 95 degrees   Knee extension    Ankle dorsiflexion    Ankle plantarflexion    Ankle inversion    Ankle eversion     (Blank rows = not tested)  LOWER EXTREMITY MMT:    MMT Right Eval Left Eval  Hip flexion 4 4  Hip extension    Hip  abduction 4+ 4+  Hip adduction 4+ 4+  Hip internal rotation    Hip external rotation    Knee flexion 4 4  Knee extension 4 4  Ankle dorsiflexion 4+ 4+  Ankle  plantarflexion    Ankle inversion    Ankle eversion    (Blank rows = not tested)  BED MOBILITY:  No reported difficulty  TRANSFERS: Sit to stand: Modified independence  Assistive device utilized: UE support      Stand to sit: Modified independence  Assistive device utilized: heavy UE      Chair to chair: Modified independence  Assistive device utilized: heavy UE        STAIRS: Not tested GAIT: Findings: Gait Characteristics: poor foot clearance- Right and poor foot clearance- Left, Distance walked: 30 ft, and Comments:    FUNCTIONAL TESTS:  5 times sit to stand: 32.05 sec heavy UE Timed up and go (TUG): 17.26 sec no AD : 11.36 sec ( .88 m/s)  BERG: test visit 2   PATIENT SURVEYS:  ABC scale: The Activities-Specific Balance Confidence (ABC) Scale 0% 10 20 30  40 50 60 70 80 90 100% No confidence<->completely confident  "How confident are you that you will not lose your balance or become unsteady when you . . .   Date tested 01/26/24  Walk around the house 100%  2. Walk up or down stairs 70%  3. Bend over and pick up a slipper from in front of a closet floor 80%  4. Reach for a small can off a shelf at eye level 100%  5. Stand on tip toes and reach for something above your head 80%  6. Stand on a chair and reach for something 40%  7. Sweep the floor 90%  8. Walk outside the house to a car parked in the driveway 100%  9. Get into or out of a car 90%  10. Walk across a parking lot to the mall 100%  11. Walk up or down a ramp 100%  12. Walk in a crowded mall where people rapidly walk past you 90%  13. Are bumped into by people as you walk through the mall 80%  14. Step onto or off of an escalator while you are holding onto the railing 90%  15. Step onto or off an escalator while holding onto parcels such that you cannot hold onto the railing 70%  16. Walk outside on icy sidewalks 80%  Total: #/16 85%                                                                                                                                  TREATMENT DATE: 02/15/24   NuStep using both BUE/BLE reciprocal movements to promote strength, coordination, endurance, and cardiorespiratory fitness. Performed level 4, 6 min. PT monitored the patient's response to the intervention throughout. Increased SOB around 3 min., continued throughout reminder of time.   Pt reporting decreased  hamstring cramping following NuStep.   Seated:  HS stretch 2 x 30 sec each LE  STS from mat table: 24'' 1x7, 1x10 Without UE support Mild R knee discomfort reported following exercise Stair stepping (1 LE onto 1st step, 2nd LE to 2nd step), 1x10 each LE w/ 1UE support Pt reported some R knee pain with activity; no LOB noted throughout activity Pt reported that L stance is easier physically, but less balanced  Pt reported I didn't think I would even be able to do this. Pt would benefit from exercises/activities to improve self efficacy.  Of note, pt reports that he is completing step to pattern for stair navigation at home.   Gait training 300' without AD Focus on normalized gait pattern with bilat arm swing, as pt reports that he usually holds his hands behind his back for improved balance & maintain upright posture Verbal cueing to look up at surroundings throughout    Throughout session pt performed sit<>stand, >5 times with supervision assist, but heavy use of bil arm rests. Continue to note improved weight shift with increased repetitions allowing increased speed of transfer.   CGA for safety with gait belt in place and CGA for balance training unless otherwise noted.   PATIENT EDUCATION: Education details: POC. Pt educated throughout session about proper posture and technique with exercises. Improved exercise technique, movement at target joints, use of target muscles after min to mod verbal, visual, tactile cues.  Person educated: Patient Education method: Explanation Education  comprehension: verbalized understanding   HOME EXERCISE PROGRAM: Access Code: [email or web address] URL: [email or web address] Date: 01/27/2024 Prepared by: Reyes London  Exercises - Seated Knee Flexion Stretch  - 1-2 x daily - 3 sets - 30 sec hold - Seated Hamstring Stretch  - 1-2 x daily - 3 sets - 30 hold - Standing Knee Flexion Stretch on Step  - 1-2 x daily - 3 sets - 30 sec hold - Standing Hamstring Stretch with Step  - 1-2 x daily - 3 sets - 30 hold    GOALS: Goals reviewed with patient? Yes  SHORT TERM GOALS: Target date: 02/23/2024      Patient will be independent in home exercise program to improve strength/mobility for better functional independence with ADLs. Baseline: No HEP currently  Goal status: INITIAL   LONG TERM GOALS: Target date: 04/19/2024    1.  Patient will complete five times sit to stand test in < 15 seconds indicating an increased LE strength and improved balance. Baseline: 32.05 sec heavy UE  Goal status: INITIAL  2.  Patient will improve ABC score to 90%   to demonstrate statistically significant improvement in mobility and quality of life as it relates to their balance.  Baseline: 85% Goal status: INITIAL   3.  Patient will increase Berg Balance score by > 6 points to demonstrate decreased fall risk during functional activities. Baseline: test visit 2; 01/27/2024= 43/56 Goal status: INITIAL   4.   Patient will reduce timed up and go to <11 seconds to reduce fall risk and demonstrate improved transfer/gait ability. Baseline: 17.26 sec Goal status: INITIAL  5.   Patient will increase 10 meter walk test to >1.34m/s as to improve gait speed for better community ambulation and to reduce fall risk. Baseline: .86 m/s Goal status: INITIAL  6.  Patient will improve right knee range of motion to 110 degrees or greater in active flexion in order to allow him to improve right lower extremity ability to assist with various transitions  including squats and sit to stands.  Baseline: 95 degrees active  Goal status: INITIAL    ASSESSMENT:  CLINICAL IMPRESSION:   Patient is an 83 year old male who presents to physical therapy for  treatment of balance impairments and lower extremity strength impairments. PT treatment focused on functional strengthening in order to promote improved QOL & build self efficacy. Pt able to complete STS from elevated seat height of mat table without significant exacerbation of knee pain & without UE support. Pt also able to tolerate stair stepping this date to promote increased SLS time & functional strengthening. No LOB noted, but CGA provided for safety.   Patient will benefit from skilled physical therapy to address the above impairments and to address the newly established therapy goals.  OBJECTIVE IMPAIRMENTS: Abnormal gait, decreased activity tolerance, decreased balance, decreased endurance, decreased mobility, difficulty walking, and decreased strength.   ACTIVITY LIMITATIONS: standing, squatting, stairs, and transfers  PARTICIPATION LIMITATIONS: shopping and community activity  PERSONAL FACTORS: Behavior pattern and 3+ comorbidities:  PMX: OA, Cancer, DM, HTN are also affecting patient's functional outcome.   REHAB POTENTIAL: Good  CLINICAL DECISION MAKING: Evolving/moderate complexity  EVALUATION COMPLEXITY: Moderate  PLAN:  PT FREQUENCY: 2x/week  PT DURATION: 12 weeks  PLANNED INTERVENTIONS: 97750- Physical Performance Testing, 97110-Therapeutic exercises, 97530- Therapeutic activity, 97112- Neuromuscular re-education, 97535- Self Care, [id number]- Manual therapy, [phone]- Gait training, Balance training, Stair training, Joint mobilization, Joint manipulation, Cryotherapy, and Moist heat  PLAN FOR NEXT SESSION:  Continue with knee ROM on R General LE strength and balance training - both static and dynamic  Dynamic gait with head turns/scanning of environment STS without UE support -  gradually decreasing seat height from 24'' this date Stair navigation: progress to step through pattern as appropriate Update HEP to include strengthening exercises   Chiquita Silvan, Student-PT 02/15/2024, 3:50 PM

## 2024-02-17 ENCOUNTER — Ambulatory Visit: Admitting: Physical Therapy

## 2024-02-17 DIAGNOSIS — R262 Difficulty in walking, not elsewhere classified: Secondary | ICD-10-CM | POA: Diagnosis not present

## 2024-02-17 DIAGNOSIS — M6281 Muscle weakness (generalized): Secondary | ICD-10-CM

## 2024-02-17 DIAGNOSIS — R2681 Unsteadiness on feet: Secondary | ICD-10-CM

## 2024-02-17 DIAGNOSIS — R269 Unspecified abnormalities of gait and mobility: Secondary | ICD-10-CM

## 2024-02-17 DIAGNOSIS — R2689 Other abnormalities of gait and mobility: Secondary | ICD-10-CM

## 2024-02-17 NOTE — Therapy (Signed)
 OUTPATIENT PHYSICAL THERAPY NEURO TREATMENT    Patient Name: Glen Jenkins MRN: [id number] DOB:Jul 28, 1940, 83 y.o., male Today's Date: 02/17/2024   PCP:   Auston Reyes BIRCH, MD   REFERRING PROVIDER:   Auston Reyes BIRCH, MD    END OF SESSION:   PT End of Session - 02/17/24 1148     Visit Number 7    Number of Visits 24    Date for PT Re-Evaluation 04/19/24    Progress Note Due on Visit 10    PT Start Time 1146    PT Stop Time 1225    PT Time Calculation (min) 39 min    Equipment Utilized During Treatment Gait belt    Activity Tolerance Patient tolerated treatment well    Behavior During Therapy WFL for tasks assessed/performed            Past Medical History:  Diagnosis Date   AAA (abdominal aortic aneurysm) (HCC)    Anginal pain (HCC)    Arthritis    lower back   Cancer (HCC)    Lung Cancer; Squamous Cell Carcinoma   Cardiomyopathy (HCC)    Coronary artery disease    CTS (carpal tunnel syndrome)    right - numbness thumb and 1st and 2nd fingers   Diabetes mellitus without complication (HCC)    Dysrhythmia    Atrial Fibrillation   GERD (gastroesophageal reflux disease)    Hypercholesterolemia    Hypertension    Nephropathy, diabetic (HCC)    Onychomycosis    Renal insufficiency    Sleep apnea    Varicose veins    Wears dentures    partial upper   Wears hearing aid in both ears    Past Surgical History:  Procedure Laterality Date   CATARACT EXTRACTION W/PHACO Left 06/27/2020   Procedure: CATARACT EXTRACTION PHACO AND INTRAOCULAR LENS PLACEMENT (IOC) LEFT DIABETIC 13.57 01:47.8 12.6%;  Surgeon: Mittie Gaskin, MD;  Location: [facility];  Service: Ophthalmology;  Laterality: Left;  Diabetic - oral meds   CATARACT EXTRACTION W/PHACO Right 07/11/2020   Procedure: CATARACT EXTRACTION PHACO AND INTRAOCULAR LENS PLACEMENT (IOC) RIGHT DIABETIC;  Surgeon: Mittie Gaskin, MD;  Location: [facility];  Service: Ophthalmology;   Laterality: Right;  9.28 1:15.4 12.3%   COLONOSCOPY WITH PROPOFOL  N/A 09/28/2015   Procedure: COLONOSCOPY WITH PROPOFOL ;  Surgeon: Lamar ONEIDA Holmes, MD;  Location: [facility];  Service: Endoscopy;  Laterality: N/A;   CORONARY ANGIOPLASTY     Stent placement x 3 (1999, [id number])   TONSILLECTOMY     Patient Active Problem List   Diagnosis Date Noted   Bilateral carpal tunnel syndrome 06/16/2023   Class 2 severe obesity due to excess calories with serious comorbidity and body mass index (BMI) of 37.0 to 37.9 in adult (HCC) 03/12/2023   Pain due to onychomycosis of toenails of both feet 05/05/2022   Coronary atherosclerosis 02/13/2020   Hypercholesterolemia 02/13/2020   Localized edema 02/13/2020   Proteinuria 02/13/2020   Abdominal aortic aneurysm (HCC) 02/13/2020   Angina pectoris (HCC) 02/13/2020   Atrial fibrillation (HCC) 02/13/2020   Cardiomyopathy (HCC) 02/13/2020   Edema of lower extremity 08/08/2019   Essential hypertension 08/08/2019   Macular degeneration 10/20/2018   Bleeding hemorrhoids 02/09/2017   CKD (chronic kidney disease) stage 3, GFR 30-59 ml/min (HCC) 01/04/2016   Type 2 diabetes mellitus (HCC) 09/18/2014    ONSET DATE: 3-4 years progressive   REFERRING DIAG: R29.898 (ICD-10-CM) - Leg weakness, bilateral   THERAPY DIAG:  Difficulty in  walking, not elsewhere classified  Unsteadiness on feet  Other abnormalities of gait and mobility  Muscle weakness (generalized)  Abnormality of gait and mobility  Rationale for Evaluation and Treatment: Rehabilitation  SUBJECTIVE:                                                                                                                                                                                             SUBJECTIVE STATEMENT:  Pt reports a lot of R knee post discomfort after last session. Reports it feels better now.   Pt reported that he feels like he has noticed an improvement in his gait & strength,  but still has most difficulty getting up from chairs, especially with lower seat heights. Pt states that he has also noticed an improvement with ROM, as he is able to get in/out of the car easier.    Pt accompanied by: self  PERTINENT HISTORY: PMX: OA, Cancer, DM, HTN  PAIN:  Are you having pain? No  PRECAUTIONS: None  RED FLAGS: None   WEIGHT BEARING RESTRICTIONS: No  FALLS: Has patient fallen in last 6 months? Yes. Number of falls 1  LIVING ENVIRONMENT: Lives with: lives with their spouse Lives in: House/apartment Stairs: Yes: External: 3 steps; can reach both Has following equipment at home: trecking pole   PLOF: Independent with household mobility with device and Independent with community mobility with device  PATIENT GOALS: Improve balance and strength   OBJECTIVE:   Note: Objective measures were completed at Evaluation unless otherwise noted.  DIAGNOSTIC FINDINGS: n/a  COGNITION: Overall cognitive status: Within functional limits for tasks assessed   SENSATION: Not tested    EDEMA:  Grade 1-2 Pitting edema noted on B Les, worse on R > L      POSTURE: rounded shoulders and forward head  LOWER EXTREMITY ROM:     Active  Right Eval Left Eval  Hip flexion    Hip extension    Hip abduction    Hip adduction    Hip internal rotation    Hip external rotation    Knee flexion 95 degrees   Knee extension    Ankle dorsiflexion    Ankle plantarflexion    Ankle inversion    Ankle eversion     (Blank rows = not tested)  LOWER EXTREMITY MMT:    MMT Right Eval Left Eval  Hip flexion 4 4  Hip extension    Hip abduction 4+ 4+  Hip adduction 4+ 4+  Hip internal rotation    Hip external rotation    Knee flexion 4 4  Knee extension 4 4  Ankle dorsiflexion 4+ 4+  Ankle plantarflexion    Ankle inversion    Ankle eversion    (Blank rows = not tested)  BED MOBILITY:  No reported difficulty  TRANSFERS: Sit to stand: Modified independence   Assistive device utilized: UE support      Stand to sit: Modified independence  Assistive device utilized: heavy UE      Chair to chair: Modified independence  Assistive device utilized: heavy UE        STAIRS: Not tested GAIT: Findings: Gait Characteristics: poor foot clearance- Right and poor foot clearance- Left, Distance walked: 30 ft, and Comments:    FUNCTIONAL TESTS:  5 times sit to stand: 32.05 sec heavy UE Timed up and go (TUG): 17.26 sec no AD : 11.36 sec ( .88 m/s)  BERG: test visit 2   PATIENT SURVEYS:  ABC scale: The Activities-Specific Balance Confidence (ABC) Scale 0% 10 20 30  40 50 60 70 80 90 100% No confidence<->completely confident  "How confident are you that you will not lose your balance or become unsteady when you . . .   Date tested 01/26/24  Walk around the house 100%  2. Walk up or down stairs 70%  3. Bend over and pick up a slipper from in front of a closet floor 80%  4. Reach for a small can off a shelf at eye level 100%  5. Stand on tip toes and reach for something above your head 80%  6. Stand on a chair and reach for something 40%  7. Sweep the floor 90%  8. Walk outside the house to a car parked in the driveway 100%  9. Get into or out of a car 90%  10. Walk across a parking lot to the mall 100%  11. Walk up or down a ramp 100%  12. Walk in a crowded mall where people rapidly walk past you 90%  13. Are bumped into by people as you walk through the mall 80%  14. Step onto or off of an escalator while you are holding onto the railing 90%  15. Step onto or off an escalator while holding onto parcels such that you cannot hold onto the railing 70%  16. Walk outside on icy sidewalks 80%  Total: #/16 85%                                                                                                                                 TREATMENT DATE: 02/17/24   TA- To improve functional movements patterns for everyday tasks   NuStep using both  BUE/BLE reciprocal movements to promote strength, coordination, endurance, and cardiorespiratory fitness. Performed rolling hills setting levels 1-5. PT monitored the patient's response to the intervention throughout.   STS from elevated plinth no UE assist  x 10 reps eccentric portion cues for no hands   Gait with 4WW x 150 ft- good balance, pt slightly slumped  -  another 150 ft without - good balance but slight imbalance noted   Standing hip abduction 2 x 10 ea LE, cues for posture   Seated knee flexion with band 2 x 10 ea LE GTB   Seated DF GTB resistance 2 x 15 reps   Standing side step on step trainer with UE assist 2 x 10 ea LE  Standing step up on step trained 2 x 10 ea LE   NMR: To facilitate reeducation of movement, balance, posture, coordination, and/or proprioception/kinesthetic sense.  1 LE on airex other on step 2 x 30 sec ea   Standing step taps 2 x 10 ea LE    CGA for safety with gait belt in place and CGA for balance training unless otherwise noted.   PATIENT EDUCATION: Education details: POC. Pt educated throughout session about proper posture and technique with exercises. Improved exercise technique, movement at target joints, use of target muscles after min to mod verbal, visual, tactile cues.  Person educated: Patient Education method: Explanation Education comprehension: verbalized understanding   HOME EXERCISE PROGRAM: Access Code: [email or web address] URL: [email or web address] Date: 01/27/2024 Prepared by: Reyes London  Exercises - Seated Knee Flexion Stretch  - 1-2 x daily - 3 sets - 30 sec hold - Seated Hamstring Stretch  - 1-2 x daily - 3 sets - 30 hold - Standing Knee Flexion Stretch on Step  - 1-2 x daily - 3 sets - 30 sec hold - Standing Hamstring Stretch with Step  - 1-2 x daily - 3 sets - 30 hold    GOALS: Goals reviewed with patient? Yes  SHORT TERM GOALS: Target date: 02/23/2024      Patient will be independent in home  exercise program to improve strength/mobility for better functional independence with ADLs. Baseline: No HEP currently  Goal status: INITIAL   LONG TERM GOALS: Target date: 04/19/2024    1.  Patient will complete five times sit to stand test in < 15 seconds indicating an increased LE strength and improved balance. Baseline: 32.05 sec heavy UE  Goal status: INITIAL  2.  Patient will improve ABC score to 90%   to demonstrate statistically significant improvement in mobility and quality of life as it relates to their balance.  Baseline: 85% Goal status: INITIAL   3.  Patient will increase Berg Balance score by > 6 points to demonstrate decreased fall risk during functional activities. Baseline: test visit 2; 01/27/2024= 43/56 Goal status: INITIAL   4.   Patient will reduce timed up and go to <11 seconds to reduce fall risk and demonstrate improved transfer/gait ability. Baseline: 17.26 sec Goal status: INITIAL  5.   Patient will increase 10 meter walk test to >1.68m/s as to improve gait speed for better community ambulation and to reduce fall risk. Baseline: .86 m/s Goal status: INITIAL  6.  Patient will improve right knee range of motion to 110 degrees or greater in active flexion in order to allow him to improve right lower extremity ability to assist with various transitions including squats and sit to stands.  Baseline: 95 degrees active  Goal status: INITIAL    ASSESSMENT:  CLINICAL IMPRESSION:    Patient is an 83 year old male who presents to physical therapy for  treatment of balance impairments and lower extremity strength impairments. PT treatment focused on functional strengthening. Held off on high reps of STS this date due to knee pain following last session. Pt reports improved pain following session compared to last session. Pt not too  inclined to use rollator, it appears his balance on level surfaces does not requires this AD as well.  Patient will benefit from  skilled physical therapy to address the above impairments and to address the newly established therapy goals.  OBJECTIVE IMPAIRMENTS: Abnormal gait, decreased activity tolerance, decreased balance, decreased endurance, decreased mobility, difficulty walking, and decreased strength.   ACTIVITY LIMITATIONS: standing, squatting, stairs, and transfers  PARTICIPATION LIMITATIONS: shopping and community activity  PERSONAL FACTORS: Behavior pattern and 3+ comorbidities:  PMX: OA, Cancer, DM, HTN are also affecting patient's functional outcome.   REHAB POTENTIAL: Good  CLINICAL DECISION MAKING: Evolving/moderate complexity  EVALUATION COMPLEXITY: Moderate  PLAN:  PT FREQUENCY: 2x/week  PT DURATION: 12 weeks  PLANNED INTERVENTIONS: [id number]- Physical Performance Testing, 97110-Therapeutic exercises, 97530- Therapeutic activity, 97112- Neuromuscular re-education, 97535- Self Care, [id number]- Manual therapy, [phone]- Gait training, Balance training, Stair training, Joint mobilization, Joint manipulation, Cryotherapy, and Moist heat  PLAN FOR NEXT SESSION:  Continue with knee ROM on R General LE strength and balance training - both static and dynamic  Dynamic gait with head turns/scanning of environment STS without UE support - gradually decreasing seat height from 24'' this date Stair navigation: progress to step through pattern as appropriate Update HEP to include strengthening exercises   Lonni KATHEE Gainer, PT 02/17/2024, 11:48 AM

## 2024-02-22 ENCOUNTER — Ambulatory Visit: Admitting: Physical Therapy

## 2024-02-22 DIAGNOSIS — R2681 Unsteadiness on feet: Secondary | ICD-10-CM

## 2024-02-22 DIAGNOSIS — R2689 Other abnormalities of gait and mobility: Secondary | ICD-10-CM

## 2024-02-22 DIAGNOSIS — R269 Unspecified abnormalities of gait and mobility: Secondary | ICD-10-CM

## 2024-02-22 DIAGNOSIS — R262 Difficulty in walking, not elsewhere classified: Secondary | ICD-10-CM

## 2024-02-22 DIAGNOSIS — M6281 Muscle weakness (generalized): Secondary | ICD-10-CM

## 2024-02-22 NOTE — Therapy (Signed)
 OUTPATIENT PHYSICAL THERAPY NEURO TREATMENT    Patient Name: Glen Jenkins MRN: [id number] DOB:[birth year], 83 y.o., male Today's Date: 02/22/2024   PCP:   Auston Reyes BIRCH, MD   REFERRING PROVIDER:   Auston Reyes BIRCH, MD    END OF SESSION:   PT End of Session - 02/22/24 1354     Visit Number 8    Number of Visits 24    Date for Recertification  04/19/24    Progress Note Due on Visit 10    PT Start Time 1146    PT Stop Time 1229    PT Time Calculation (min) 43 min    Activity Tolerance Patient tolerated treatment well;No increased pain    Behavior During Therapy WFL for tasks assessed/performed             Past Medical History:  Diagnosis Date   AAA (abdominal aortic aneurysm)    Anginal pain    Arthritis    lower back   Cancer (HCC)    Lung Cancer; Squamous Cell Carcinoma   Cardiomyopathy (HCC)    Coronary artery disease    CTS (carpal tunnel syndrome)    right - numbness thumb and 1st and 2nd fingers   Diabetes mellitus without complication (HCC)    Dysrhythmia    Atrial Fibrillation   GERD (gastroesophageal reflux disease)    Hypercholesterolemia    Hypertension    Nephropathy, diabetic (HCC)    Onychomycosis    Renal insufficiency    Sleep apnea    Varicose veins    Wears dentures    partial upper   Wears hearing aid in both ears    Past Surgical History:  Procedure Laterality Date   CATARACT EXTRACTION W/PHACO Left 06/27/2020   Procedure: CATARACT EXTRACTION PHACO AND INTRAOCULAR LENS PLACEMENT (IOC) LEFT DIABETIC 13.57 01:47.8 12.6%;  Surgeon: Mittie Gaskin, MD;  Location: [facility];  Service: Ophthalmology;  Laterality: Left;  Diabetic - oral meds   CATARACT EXTRACTION W/PHACO Right 07/11/2020   Procedure: CATARACT EXTRACTION PHACO AND INTRAOCULAR LENS PLACEMENT (IOC) RIGHT DIABETIC;  Surgeon: Mittie Gaskin, MD;  Location: [facility];  Service: Ophthalmology;  Laterality: Right;  9.28 1:15.4 12.3%    COLONOSCOPY WITH PROPOFOL  N/A 09/28/2015   Procedure: COLONOSCOPY WITH PROPOFOL ;  Surgeon: Lamar ONEIDA Holmes, MD;  Location: [facility];  Service: Endoscopy;  Laterality: N/A;   CORONARY ANGIOPLASTY     Stent placement x 3 (1999, [id number],7987)   TONSILLECTOMY     Patient Active Problem List   Diagnosis Date Noted   Bilateral carpal tunnel syndrome 06/16/2023   Class 2 severe obesity due to excess calories with serious comorbidity and body mass index (BMI) of 37.0 to 37.9 in adult (HCC) 03/12/2023   Pain due to onychomycosis of toenails of both feet 05/05/2022   Coronary atherosclerosis 02/13/2020   Hypercholesterolemia 02/13/2020   Localized edema 02/13/2020   Proteinuria 02/13/2020   Abdominal aortic aneurysm 02/13/2020   Angina pectoris (HCC) 02/13/2020   Atrial fibrillation (HCC) 02/13/2020   Cardiomyopathy (HCC) 02/13/2020   Edema of lower extremity 08/08/2019   Essential hypertension 08/08/2019   Macular degeneration 10/20/2018   Bleeding hemorrhoids 02/09/2017   CKD (chronic kidney disease) stage 3, GFR 30-59 ml/min (HCC) 01/04/2016   Type 2 diabetes mellitus (HCC) 09/18/2014    ONSET DATE: 3-4 years progressive   REFERRING DIAG: R29.898 (ICD-10-CM) - Leg weakness, bilateral   THERAPY DIAG:  Difficulty in walking, not elsewhere classified  Muscle weakness (generalized)  Unsteadiness on feet  Abnormality of gait and mobility  Other abnormalities of gait and mobility  Rationale for Evaluation and Treatment: Rehabilitation  SUBJECTIVE:                                                                                                                                                                                             SUBJECTIVE STATEMENT: Pt reports that he is doing very well today. Pt denies knee discomfort currently; at night, he has been experiencing some near cramps in the back of the leg, R > L. Pt states that he also sometimes experiences restless legs.  States that he enjoys NuStep, finds it very beneficial.    Pt accompanied by: self  PERTINENT HISTORY: PMX: OA, Cancer, DM, HTN  PAIN:  Are you having pain? No  PRECAUTIONS: None  RED FLAGS: None   WEIGHT BEARING RESTRICTIONS: No  FALLS: Has patient fallen in last 6 months? Yes. Number of falls 1  LIVING ENVIRONMENT: Lives with: lives with their spouse Lives in: House/apartment Stairs: Yes: External: 3 steps; can reach both Has following equipment at home: trecking pole   PLOF: Independent with household mobility with device and Independent with community mobility with device  PATIENT GOALS: Improve balance and strength   OBJECTIVE:   Note: Objective measures were completed at Evaluation unless otherwise noted.  DIAGNOSTIC FINDINGS: n/a  COGNITION: Overall cognitive status: Within functional limits for tasks assessed   SENSATION: Not tested    EDEMA:  Grade 1-2 Pitting edema noted on B Les, worse on R > L      POSTURE: rounded shoulders and forward head  LOWER EXTREMITY ROM:     Active  Right Eval Left Eval RIGHT  LE  02/22/2024  Hip flexion     Hip extension     Hip abduction     Hip adduction     Hip internal rotation     Hip external rotation     Knee flexion 95 degrees  100 degrees AROM & 104 degrees AROM when trying to reach terminal ROM  Knee extension     Ankle dorsiflexion     Ankle plantarflexion     Ankle inversion     Ankle eversion      (Blank rows = not tested)  LOWER EXTREMITY MMT:    MMT Right Eval Left Eval  Hip flexion 4 4  Hip extension    Hip abduction 4+ 4+  Hip adduction 4+ 4+  Hip internal rotation    Hip external rotation    Knee flexion 4 4  Knee extension 4 4  Ankle dorsiflexion  4+ 4+  Ankle plantarflexion    Ankle inversion    Ankle eversion    (Blank rows = not tested)  BED MOBILITY:  No reported difficulty  TRANSFERS: Sit to stand: Modified independence  Assistive device utilized: UE support       Stand to sit: Modified independence  Assistive device utilized: heavy UE      Chair to chair: Modified independence  Assistive device utilized: heavy UE        STAIRS: Not tested GAIT: Findings: Gait Characteristics: poor foot clearance- Right and poor foot clearance- Left, Distance walked: 30 ft, and Comments:    FUNCTIONAL TESTS:  5 times sit to stand: 32.05 sec heavy UE Timed up and go (TUG): 17.26 sec no AD : 11.36 sec ( .88 m/s)  BERG: test visit 2   PATIENT SURVEYS:  ABC scale: The Activities-Specific Balance Confidence (ABC) Scale 0% 10 20 30  40 50 60 70 80 90 100% No confidence<->completely confident  "How confident are you that you will not lose your balance or become unsteady when you . . .   Date tested 01/26/24  Walk around the house 100%  2. Walk up or down stairs 70%  3. Bend over and pick up a slipper from in front of a closet floor 80%  4. Reach for a small can off a shelf at eye level 100%  5. Stand on tip toes and reach for something above your head 80%  6. Stand on a chair and reach for something 40%  7. Sweep the floor 90%  8. Walk outside the house to a car parked in the driveway 100%  9. Get into or out of a car 90%  10. Walk across a parking lot to the mall 100%  11. Walk up or down a ramp 100%  12. Walk in a crowded mall where people rapidly walk past you 90%  13. Are bumped into by people as you walk through the mall 80%  14. Step onto or off of an escalator while you are holding onto the railing 90%  15. Step onto or off an escalator while holding onto parcels such that you cannot hold onto the railing 70%  16. Walk outside on icy sidewalks 80%  Total: #/16 85%                                                                                                                                 TREATMENT DATE: 02/22/24   NuStep using both BUE/BLE reciprocal movements to promote strength, coordination, endurance, and cardiorespiratory fitness.  Performed levels 1-5, total of 8 min. PT monitored the patient's response to the intervention throughout.   Standing hip abduction 2 x 10 ea LE with BUE support at // bars, cues for increased upright posture   STS from elevated plinth no UE assist  2x 10 reps eccentric portion cues for no hands  1st set mat @ 24'' >  2nd set mat @ 23''   Standing side step on step trainer with UE assist 2 x 10 ea LE Pt reporting some medial knee pain with R knee Stepping only onto 1st step, 10x leading with each LE Able to decrease to 1 UE support at railing  Hamstring curls with GTB, 2x10 each LE Verbal cueing for increased eccentric control  Patient inquiring about updated ROM with R knee; 100 AROM, 104 terminal ROM.  Gait 150' without AD  Verbal curing for increased upright posture with reciprocal arm swing, looking up at environment   PATIENT EDUCATION: Education details: POC. Pt educated throughout session about proper posture and technique with exercises. Improved exercise technique, movement at target joints, use of target muscles after min to mod verbal, visual, tactile cues.  Person educated: Patient Education method: Explanation Education comprehension: verbalized understanding   HOME EXERCISE PROGRAM: Access Code: [email or web address] URL: [email or web address] Date: 01/27/2024 Prepared by: Reyes London  Exercises - Seated Knee Flexion Stretch  - 1-2 x daily - 3 sets - 30 sec hold - Seated Hamstring Stretch  - 1-2 x daily - 3 sets - 30 hold - Standing Knee Flexion Stretch on Step  - 1-2 x daily - 3 sets - 30 sec hold - Standing Hamstring Stretch with Step  - 1-2 x daily - 3 sets - 30 hold    GOALS: Goals reviewed with patient? Yes  SHORT TERM GOALS: Target date: 02/23/2024      Patient will be independent in home exercise program to improve strength/mobility for better functional independence with ADLs. Baseline: No HEP currently  Goal status: INITIAL   LONG  TERM GOALS: Target date: 04/19/2024    1.  Patient will complete five times sit to stand test in < 15 seconds indicating an increased LE strength and improved balance. Baseline: 32.05 sec heavy UE  Goal status: INITIAL  2.  Patient will improve ABC score to 90%   to demonstrate statistically significant improvement in mobility and quality of life as it relates to their balance.  Baseline: 85% Goal status: INITIAL   3.  Patient will increase Berg Balance score by > 6 points to demonstrate decreased fall risk during functional activities. Baseline: test visit 2; 01/27/2024= 43/56 Goal status: INITIAL   4.   Patient will reduce timed up and go to <11 seconds to reduce fall risk and demonstrate improved transfer/gait ability. Baseline: 17.26 sec Goal status: INITIAL  5.   Patient will increase 10 meter walk test to >1.62m/s as to improve gait speed for better community ambulation and to reduce fall risk. Baseline: .86 m/s Goal status: INITIAL  6.  Patient will improve right knee range of motion to 110 degrees or greater in active flexion in order to allow him to improve right lower extremity ability to assist with various transitions including squats and sit to stands.  Baseline: 95 degrees active  Goal status: INITIAL    ASSESSMENT:  CLINICAL IMPRESSION:    Patient is an 83 year old male who presents to physical therapy for  treatment of balance impairments and lower extremity strength impairments. PT treatment focused on functional strengthening. Pt reporting some knee discomfort with lateral stepping on step trainer with R knee. Continued to promote increased upright posture & reciprocal arm swing with gait. Patient inquiring about updated ROM with R knee; 100 AROM, 104 terminal ROM. Patient will benefit from skilled physical therapy to address the above impairments and to address the newly established therapy goals.  OBJECTIVE IMPAIRMENTS: Abnormal  gait, decreased activity  tolerance, decreased balance, decreased endurance, decreased mobility, difficulty walking, and decreased strength.   ACTIVITY LIMITATIONS: standing, squatting, stairs, and transfers  PARTICIPATION LIMITATIONS: shopping and community activity  PERSONAL FACTORS: Behavior pattern and 3+ comorbidities:  PMX: OA, Cancer, DM, HTN are also affecting patient's functional outcome.   REHAB POTENTIAL: Good  CLINICAL DECISION MAKING: Evolving/moderate complexity  EVALUATION COMPLEXITY: Moderate  PLAN:  PT FREQUENCY: 2x/week  PT DURATION: 12 weeks  PLANNED INTERVENTIONS: 97750- Physical Performance Testing, 97110-Therapeutic exercises, 97530- Therapeutic activity, 97112- Neuromuscular re-education, 97535- Self Care, [id number]- Manual therapy, [phone]- Gait training, Balance training, Stair training, Joint mobilization, Joint manipulation, Cryotherapy, and Moist heat  PLAN FOR NEXT SESSION:  Continue with knee ROM on R General LE strength and balance training - both static and dynamic within limits of knee pain Dynamic gait with head turns/scanning of environment STS without UE support - gradually decreasing seat height from 23'' this date Stair navigation: progress to step through pattern as appropriate Update HEP to include strengthening exercises   Chiquita Silvan, Student-PT 02/22/2024, 1:55 PM

## 2024-02-24 ENCOUNTER — Ambulatory Visit: Admitting: Physical Therapy

## 2024-02-24 DIAGNOSIS — R2689 Other abnormalities of gait and mobility: Secondary | ICD-10-CM

## 2024-02-24 DIAGNOSIS — R262 Difficulty in walking, not elsewhere classified: Secondary | ICD-10-CM

## 2024-02-24 DIAGNOSIS — R2681 Unsteadiness on feet: Secondary | ICD-10-CM

## 2024-02-24 DIAGNOSIS — M6281 Muscle weakness (generalized): Secondary | ICD-10-CM

## 2024-02-24 DIAGNOSIS — R269 Unspecified abnormalities of gait and mobility: Secondary | ICD-10-CM

## 2024-02-24 NOTE — Therapy (Signed)
 OUTPATIENT PHYSICAL THERAPY NEURO TREATMENT    Patient Name: Glen Jenkins MRN: [id number] DOB:04/27/1941, 83 y.o., male Today's Date: 02/24/2024   PCP:   Auston Reyes BIRCH, MD   REFERRING PROVIDER:   Auston Reyes BIRCH, MD    END OF SESSION:   PT End of Session - 02/24/24 1253     Visit Number 9    Number of Visits 24    Date for Recertification  04/19/24    Progress Note Due on Visit 10    PT Start Time 1141    PT Stop Time 1225    PT Time Calculation (min) 44 min    Activity Tolerance Patient tolerated treatment well;No increased pain    Behavior During Therapy WFL for tasks assessed/performed              Past Medical History:  Diagnosis Date   AAA (abdominal aortic aneurysm)    Anginal pain    Arthritis    lower back   Cancer (HCC)    Lung Cancer; Squamous Cell Carcinoma   Cardiomyopathy (HCC)    Coronary artery disease    CTS (carpal tunnel syndrome)    right - numbness thumb and 1st and 2nd fingers   Diabetes mellitus without complication (HCC)    Dysrhythmia    Atrial Fibrillation   GERD (gastroesophageal reflux disease)    Hypercholesterolemia    Hypertension    Nephropathy, diabetic (HCC)    Onychomycosis    Renal insufficiency    Sleep apnea    Varicose veins    Wears dentures    partial upper   Wears hearing aid in both ears    Past Surgical History:  Procedure Laterality Date   CATARACT EXTRACTION W/PHACO Left 06/27/2020   Procedure: CATARACT EXTRACTION PHACO AND INTRAOCULAR LENS PLACEMENT (IOC) LEFT DIABETIC 13.57 01:47.8 12.6%;  Surgeon: Mittie Gaskin, MD;  Location: [facility];  Service: Ophthalmology;  Laterality: Left;  Diabetic - oral meds   CATARACT EXTRACTION W/PHACO Right 07/11/2020   Procedure: CATARACT EXTRACTION PHACO AND INTRAOCULAR LENS PLACEMENT (IOC) RIGHT DIABETIC;  Surgeon: Mittie Gaskin, MD;  Location: [facility];  Service: Ophthalmology;  Laterality: Right;  9.28 1:15.4 12.3%    COLONOSCOPY WITH PROPOFOL  N/A 09/28/2015   Procedure: COLONOSCOPY WITH PROPOFOL ;  Surgeon: Lamar ONEIDA Holmes, MD;  Location: [facility];  Service: Endoscopy;  Laterality: N/A;   CORONARY ANGIOPLASTY     Stent placement x 3 (1999, [id number])   TONSILLECTOMY     Patient Active Problem List   Diagnosis Date Noted   Bilateral carpal tunnel syndrome 06/16/2023   Class 2 severe obesity due to excess calories with serious comorbidity and body mass index (BMI) of 37.0 to 37.9 in adult 03/12/2023   Pain due to onychomycosis of toenails of both feet 05/05/2022   Coronary atherosclerosis 02/13/2020   Hypercholesterolemia 02/13/2020   Localized edema 02/13/2020   Proteinuria 02/13/2020   Abdominal aortic aneurysm 02/13/2020   Angina pectoris 02/13/2020   Atrial fibrillation (HCC) 02/13/2020   Cardiomyopathy (HCC) 02/13/2020   Edema of lower extremity 08/08/2019   Essential hypertension 08/08/2019   Macular degeneration 10/20/2018   Bleeding hemorrhoids 02/09/2017   CKD (chronic kidney disease) stage 3, GFR 30-59 ml/min (HCC) 01/04/2016   Type 2 diabetes mellitus (HCC) 09/18/2014    ONSET DATE: 3-4 years progressive   REFERRING DIAG: R29.898 (ICD-10-CM) - Leg weakness, bilateral   THERAPY DIAG:  Difficulty in walking, not elsewhere classified  Muscle weakness (generalized)  Unsteadiness  on feet  Abnormality of gait and mobility  Other abnormalities of gait and mobility  Rationale for Evaluation and Treatment: Rehabilitation  SUBJECTIVE:                                                                                                                                                                                             SUBJECTIVE STATEMENT:  Pt reports doing well today. Pt denies any recent falls/stumbles since prior session. Pt denies any updates to medications or medical appointment since prior session. Pt reports good compliance with HEP when time permits.     Pt  accompanied by: self  PERTINENT HISTORY: PMX: OA, Cancer, DM, HTN  PAIN:  Are you having pain? No  PRECAUTIONS: None  RED FLAGS: None   WEIGHT BEARING RESTRICTIONS: No  FALLS: Has patient fallen in last 6 months? Yes. Number of falls 1  LIVING ENVIRONMENT: Lives with: lives with their spouse Lives in: House/apartment Stairs: Yes: External: 3 steps; can reach both Has following equipment at home: trecking pole   PLOF: Independent with household mobility with device and Independent with community mobility with device  PATIENT GOALS: Improve balance and strength   OBJECTIVE:   Note: Objective measures were completed at Evaluation unless otherwise noted.  DIAGNOSTIC FINDINGS: n/a  COGNITION: Overall cognitive status: Within functional limits for tasks assessed   SENSATION: Not tested    EDEMA:  Grade 1-2 Pitting edema noted on B Les, worse on R > L      POSTURE: rounded shoulders and forward head  LOWER EXTREMITY ROM:     Active  Right Eval Left Eval RIGHT  LE  02/22/2024  Hip flexion     Hip extension     Hip abduction     Hip adduction     Hip internal rotation     Hip external rotation     Knee flexion 95 degrees  100 degrees AROM & 104 degrees AROM when trying to reach terminal ROM  Knee extension     Ankle dorsiflexion     Ankle plantarflexion     Ankle inversion     Ankle eversion      (Blank rows = not tested)  LOWER EXTREMITY MMT:    MMT Right Eval Left Eval  Hip flexion 4 4  Hip extension    Hip abduction 4+ 4+  Hip adduction 4+ 4+  Hip internal rotation    Hip external rotation    Knee flexion 4 4  Knee extension 4 4  Ankle dorsiflexion 4+ 4+  Ankle plantarflexion    Ankle inversion    Ankle eversion    (  Blank rows = not tested)  BED MOBILITY:  No reported difficulty  TRANSFERS: Sit to stand: Modified independence  Assistive device utilized: UE support      Stand to sit: Modified independence  Assistive device utilized:  heavy UE      Chair to chair: Modified independence  Assistive device utilized: heavy UE        STAIRS: Not tested GAIT: Findings: Gait Characteristics: poor foot clearance- Right and poor foot clearance- Left, Distance walked: 30 ft, and Comments:    FUNCTIONAL TESTS:  5 times sit to stand: 32.05 sec heavy UE Timed up and go (TUG): 17.26 sec no AD : 11.36 sec ( .88 m/s)  BERG: test visit 2   PATIENT SURVEYS:  ABC scale: The Activities-Specific Balance Confidence (ABC) Scale 0% 10 20 30  40 50 60 70 80 90 100% No confidence<->completely confident  "How confident are you that you will not lose your balance or become unsteady when you . . .   Date tested 01/26/24  Walk around the house 100%  2. Walk up or down stairs 70%  3. Bend over and pick up a slipper from in front of a closet floor 80%  4. Reach for a small can off a shelf at eye level 100%  5. Stand on tip toes and reach for something above your head 80%  6. Stand on a chair and reach for something 40%  7. Sweep the floor 90%  8. Walk outside the house to a car parked in the driveway 100%  9. Get into or out of a car 90%  10. Walk across a parking lot to the mall 100%  11. Walk up or down a ramp 100%  12. Walk in a crowded mall where people rapidly walk past you 90%  13. Are bumped into by people as you walk through the mall 80%  14. Step onto or off of an escalator while you are holding onto the railing 90%  15. Step onto or off an escalator while holding onto parcels such that you cannot hold onto the railing 70%  16. Walk outside on icy sidewalks 80%  Total: #/16 85%                                                                                                                                 TREATMENT DATE: 02/24/24  NMR/ TE/ TA NuStep using both BUE/BLE reciprocal movements to promote strength, coordination, endurance, and cardiorespiratory fitness. Performed levels 1-5, total of 8 min with double hill setting.  PT monitored the patient's response to the intervention throughout.   Standing hip abduction 2 x 10 ea LE with BUE support at // bars, cues for increased upright posture   STS from elevated plinth no UE assist  3 x 10 reps eccentric portion cues for no hands   Step to step trainer forward x 10 ea with 1 riser   - same  but side step on  - UE assist on both but just for balance.   Standing side step with resistance band around ankles 2 x 10 ea with RTB   - same band with hip extension 2 x 10 reps   Hamstring curls with GTB, 2x10 each LE Verbal cueing for increased eccentric control - 3 sec hold in end range flexion on the R   Stance on airex with step taps  X 20 reps  1 foot airex other on step x 30 sec ea    PATIENT EDUCATION: Education details: POC. Pt educated throughout session about proper posture and technique with exercises. Improved exercise technique, movement at target joints, use of target muscles after min to mod verbal, visual, tactile cues.  Person educated: Patient Education method: Explanation Education comprehension: verbalized understanding   HOME EXERCISE PROGRAM: Access Code: [email or web address] URL: [email or web address] Date: 01/27/2024 Prepared by: Reyes London  Exercises - Seated Knee Flexion Stretch  - 1-2 x daily - 3 sets - 30 sec hold - Seated Hamstring Stretch  - 1-2 x daily - 3 sets - 30 hold - Standing Knee Flexion Stretch on Step  - 1-2 x daily - 3 sets - 30 sec hold - Standing Hamstring Stretch with Step  - 1-2 x daily - 3 sets - 30 hold    GOALS: Goals reviewed with patient? Yes  SHORT TERM GOALS: Target date: 02/23/2024      Patient will be independent in home exercise program to improve strength/mobility for better functional independence with ADLs. Baseline: No HEP currently  Goal status: INITIAL   LONG TERM GOALS: Target date: 04/19/2024    1.  Patient will complete five times sit to stand test in < 15 seconds  indicating an increased LE strength and improved balance. Baseline: 32.05 sec heavy UE  Goal status: INITIAL  2.  Patient will improve ABC score to 90%   to demonstrate statistically significant improvement in mobility and quality of life as it relates to their balance.  Baseline: 85% Goal status: INITIAL   3.  Patient will increase Berg Balance score by > 6 points to demonstrate decreased fall risk during functional activities. Baseline: test visit 2; 01/27/2024= 43/56 Goal status: INITIAL   4.   Patient will reduce timed up and go to <11 seconds to reduce fall risk and demonstrate improved transfer/gait ability. Baseline: 17.26 sec Goal status: INITIAL  5.   Patient will increase 10 meter walk test to >1.26m/s as to improve gait speed for better community ambulation and to reduce fall risk. Baseline: .86 m/s Goal status: INITIAL  6.  Patient will improve right knee range of motion to 110 degrees or greater in active flexion in order to allow him to improve right lower extremity ability to assist with various transitions including squats and sit to stands.  Baseline: 95 degrees active  Goal status: INITIAL    ASSESSMENT:  CLINICAL IMPRESSION:    Patient is an 83 year old male who presents to physical therapy for  treatment of balance impairments and lower extremity strength impairments. PT treatment focused on functional strengthening. Pt reporting some knee discomfort with lateral stepping on step trainer with R knee. Continued to promote increased upright posture & reciprocal arm swing with gait. Patient inquiring about updated ROM with R knee; 100 AROM, 104 terminal ROM. Patient will benefit from skilled physical therapy to address the above impairments and to address the newly established therapy goals.  OBJECTIVE IMPAIRMENTS: Abnormal gait, decreased activity tolerance, decreased  balance, decreased endurance, decreased mobility, difficulty walking, and decreased strength.    ACTIVITY LIMITATIONS: standing, squatting, stairs, and transfers  PARTICIPATION LIMITATIONS: shopping and community activity  PERSONAL FACTORS: Behavior pattern and 3+ comorbidities:  PMX: OA, Cancer, DM, HTN are also affecting patient's functional outcome.   REHAB POTENTIAL: Good  CLINICAL DECISION MAKING: Evolving/moderate complexity  EVALUATION COMPLEXITY: Moderate  PLAN:  PT FREQUENCY: 2x/week  PT DURATION: 12 weeks  PLANNED INTERVENTIONS: [id number]- Physical Performance Testing, 97110-Therapeutic exercises, 97530- Therapeutic activity, 97112- Neuromuscular re-education, 97535- Self Care, [id number]- Manual therapy, [phone]- Gait training, Balance training, Stair training, Joint mobilization, Joint manipulation, Cryotherapy, and Moist heat  PLAN FOR NEXT SESSION:  Continue with knee ROM on R General LE strength and balance training - both static and dynamic within limits of knee pain Dynamic gait with head turns/scanning of environment STS without UE support - gradually decreasing seat height from 23'' this date Stair navigation: progress to step through pattern as appropriate Update HEP to include strengthening exercises   Lonni KATHEE Gainer, PT 02/24/2024, 12:53 PM

## 2024-02-29 ENCOUNTER — Ambulatory Visit

## 2024-02-29 DIAGNOSIS — R262 Difficulty in walking, not elsewhere classified: Secondary | ICD-10-CM | POA: Diagnosis not present

## 2024-02-29 DIAGNOSIS — R2681 Unsteadiness on feet: Secondary | ICD-10-CM

## 2024-02-29 DIAGNOSIS — M6281 Muscle weakness (generalized): Secondary | ICD-10-CM

## 2024-02-29 DIAGNOSIS — R269 Unspecified abnormalities of gait and mobility: Secondary | ICD-10-CM

## 2024-02-29 DIAGNOSIS — R2689 Other abnormalities of gait and mobility: Secondary | ICD-10-CM

## 2024-02-29 NOTE — Therapy (Signed)
 OUTPATIENT PHYSICAL THERAPY NEURO TREATMENT/Physical Therapy Progress Note   Dates of reporting period  01/26/2024   to   02/29/2024    Patient Name: Glen Jenkins MRN: [id number] DOB:[birth year], 83 y.o., male Today's Date: 02/29/2024   PCP:   Auston Reyes BIRCH, MD   REFERRING PROVIDER:   Auston Reyes BIRCH, MD    END OF SESSION:   PT End of Session - 02/29/24 1315     Visit Number 10    Number of Visits 24    Date for Recertification  04/19/24    Progress Note Due on Visit 10    PT Start Time 1315    PT Stop Time 1359    PT Time Calculation (min) 44 min    Equipment Utilized During Treatment Gait belt    Activity Tolerance Patient tolerated treatment well;No increased pain    Behavior During Therapy WFL for tasks assessed/performed               Past Medical History:  Diagnosis Date   AAA (abdominal aortic aneurysm)    Anginal pain    Arthritis    lower back   Cancer (HCC)    Lung Cancer; Squamous Cell Carcinoma   Cardiomyopathy (HCC)    Coronary artery disease    CTS (carpal tunnel syndrome)    right - numbness thumb and 1st and 2nd fingers   Diabetes mellitus without complication (HCC)    Dysrhythmia    Atrial Fibrillation   GERD (gastroesophageal reflux disease)    Hypercholesterolemia    Hypertension    Nephropathy, diabetic (HCC)    Onychomycosis    Renal insufficiency    Sleep apnea    Varicose veins    Wears dentures    partial upper   Wears hearing aid in both ears    Past Surgical History:  Procedure Laterality Date   CATARACT EXTRACTION W/PHACO Left 06/27/2020   Procedure: CATARACT EXTRACTION PHACO AND INTRAOCULAR LENS PLACEMENT (IOC) LEFT DIABETIC 13.57 01:47.8 12.6%;  Surgeon: Mittie Gaskin, MD;  Location: [facility];  Service: Ophthalmology;  Laterality: Left;  Diabetic - oral meds   CATARACT EXTRACTION W/PHACO Right 07/11/2020   Procedure: CATARACT EXTRACTION PHACO AND INTRAOCULAR LENS PLACEMENT (IOC) RIGHT DIABETIC;   Surgeon: Mittie Gaskin, MD;  Location: [facility];  Service: Ophthalmology;  Laterality: Right;  9.28 1:15.4 12.3%   COLONOSCOPY WITH PROPOFOL  N/A 09/28/2015   Procedure: COLONOSCOPY WITH PROPOFOL ;  Surgeon: Lamar ONEIDA Holmes, MD;  Location: [facility];  Service: Endoscopy;  Laterality: N/A;   CORONARY ANGIOPLASTY     Stent placement x 3 (1999, [id number])   TONSILLECTOMY     Patient Active Problem List   Diagnosis Date Noted   Bilateral carpal tunnel syndrome 06/16/2023   Class 2 severe obesity due to excess calories with serious comorbidity and body mass index (BMI) of 37.0 to 37.9 in adult 03/12/2023   Pain due to onychomycosis of toenails of both feet 05/05/2022   Coronary atherosclerosis 02/13/2020   Hypercholesterolemia 02/13/2020   Localized edema 02/13/2020   Proteinuria 02/13/2020   Abdominal aortic aneurysm 02/13/2020   Angina pectoris 02/13/2020   Atrial fibrillation (HCC) 02/13/2020   Cardiomyopathy (HCC) 02/13/2020   Edema of lower extremity 08/08/2019   Essential hypertension 08/08/2019   Macular degeneration 10/20/2018   Bleeding hemorrhoids 02/09/2017   CKD (chronic kidney disease) stage 3, GFR 30-59 ml/min (HCC) 01/04/2016   Type 2 diabetes mellitus (HCC) 09/18/2014    ONSET DATE: 3-4 years progressive  REFERRING DIAG: R29.898 (ICD-10-CM) - Leg weakness, bilateral   THERAPY DIAG:  Difficulty in walking, not elsewhere classified  Muscle weakness (generalized)  Unsteadiness on feet  Abnormality of gait and mobility  Other abnormalities of gait and mobility  Rationale for Evaluation and Treatment: Rehabilitation  SUBJECTIVE:                                                                                                                                                                                             SUBJECTIVE STATEMENT:  Patient reports feeling better- I can tell Im getting stronger and moving some better. Still dealing  with this knee and having issues getting up.   Pt accompanied by: self  PERTINENT HISTORY: PMX: OA, Cancer, DM, HTN  PAIN:  Are you having pain? No  PRECAUTIONS: None  RED FLAGS: None   WEIGHT BEARING RESTRICTIONS: No  FALLS: Has patient fallen in last 6 months? Yes. Number of falls 1  LIVING ENVIRONMENT: Lives with: lives with their spouse Lives in: House/apartment Stairs: Yes: External: 3 steps; can reach both Has following equipment at home: trecking pole   PLOF: Independent with household mobility with device and Independent with community mobility with device  PATIENT GOALS: Improve balance and strength   OBJECTIVE:   Note: Objective measures were completed at Evaluation unless otherwise noted.  DIAGNOSTIC FINDINGS: n/a  COGNITION: Overall cognitive status: Within functional limits for tasks assessed   SENSATION: Not tested    EDEMA:  Grade 1-2 Pitting edema noted on B Les, worse on R > L      POSTURE: rounded shoulders and forward head  LOWER EXTREMITY ROM:     Active  Right Eval Left Eval RIGHT  LE  02/22/2024  Hip flexion     Hip extension     Hip abduction     Hip adduction     Hip internal rotation     Hip external rotation     Knee flexion 95 degrees  100 degrees AROM & 104 degrees AROM when trying to reach terminal ROM  Knee extension     Ankle dorsiflexion     Ankle plantarflexion     Ankle inversion     Ankle eversion      (Blank rows = not tested)  LOWER EXTREMITY MMT:    MMT Right Eval Left Eval  Hip flexion 4 4  Hip extension    Hip abduction 4+ 4+  Hip adduction 4+ 4+  Hip internal rotation    Hip external rotation    Knee flexion 4 4  Knee extension 4 4  Ankle dorsiflexion 4+ 4+  Ankle plantarflexion    Ankle inversion    Ankle eversion    (Blank rows = not tested)  BED MOBILITY:  No reported difficulty  TRANSFERS: Sit to stand: Modified independence  Assistive device utilized: UE support      Stand to  sit: Modified independence  Assistive device utilized: heavy UE      Chair to chair: Modified independence  Assistive device utilized: heavy UE        STAIRS: Not tested GAIT: Findings: Gait Characteristics: poor foot clearance- Right and poor foot clearance- Left, Distance walked: 30 ft, and Comments:    FUNCTIONAL TESTS:  5 times sit to stand: 32.05 sec heavy UE Timed up and go (TUG): 17.26 sec no AD : 11.36 sec ( .88 m/s)  BERG: test visit 2   PATIENT SURVEYS:  ABC scale: The Activities-Specific Balance Confidence (ABC) Scale 0% 10 20 30  40 50 60 70 80 90 100% No confidence<->completely confident  "How confident are you that you will not lose your balance or become unsteady when you . . .   Date tested 01/26/24  Walk around the house 100%  2. Walk up or down stairs 70%  3. Bend over and pick up a slipper from in front of a closet floor 80%  4. Reach for a small can off a shelf at eye level 100%  5. Stand on tip toes and reach for something above your head 80%  6. Stand on a chair and reach for something 40%  7. Sweep the floor 90%  8. Walk outside the house to a car parked in the driveway 100%  9. Get into or out of a car 90%  10. Walk across a parking lot to the mall 100%  11. Walk up or down a ramp 100%  12. Walk in a crowded mall where people rapidly walk past you 90%  13. Are bumped into by people as you walk through the mall 80%  14. Step onto or off of an escalator while you are holding onto the railing 90%  15. Step onto or off an escalator while holding onto parcels such that you cannot hold onto the railing 70%  16. Walk outside on icy sidewalks 80%  Total: #/16 85%                                                                                                                                 TREATMENT DATE: 02/29/24   Physical therapy treatment session today consisted of completing assessment of goals and administration of testing as demonstrated and  documented in flow sheet, treatment, and goals section of this note. Addition treatments may be found below.   Pt performed 5 time sit<>stand (5xSTS): 16.52 sec with heavy UE Support (>15 sec indicates increased fall risk)    PT instructed pt in TUG: 15.9 sec (average of 3 trials; >13.5 sec indicates increased fall risk)   10 Meter  Walk Test: Patient instructed to walk 10 meters (32.8 ft) as quickly and as safely as possible at their normal speed x2 and at a fast speed x2. Time measured from 2 meter mark to 8 meter mark to accommodate ramp-up and ramp-down.  Normal speed 1: 0.97 m/s Normal speed 2: 0.97 m/s Average Normal speed: 0.97 m/s Cut off scores: <0.4 m/s = household Ambulator, 0.4-0.8 m/s = limited community Ambulator, >0.8 m/s = community Ambulator, >1.2 m/s = crossing a street, <1.0 = increased fall risk MCID 0.05 m/s (small), 0.13 m/s (moderate), 0.06 m/s (significant)  (ANPTA Core Set of Outcome Measures for Adults with Neurologic Conditions, 2018)    [facility] PT Assessment - 02/29/24 1337       Berg Balance Test   Sit to Stand Able to stand  independently using hands    Standing Unsupported Able to stand safely 2 minutes    Sitting with Back Unsupported but Feet Supported on Floor or Stool Able to sit safely and securely 2 minutes    Stand to Sit Controls descent by using hands    Transfers Able to transfer safely, definite need of hands    Standing Unsupported with Eyes Closed Able to stand 10 seconds safely    Standing Unsupported with Feet Together Able to place feet together independently and stand 1 minute safely    From Standing, Reach Forward with Outstretched Arm Can reach forward >12 cm safely (5)    From Standing Position, Pick up Object from Floor Able to pick up shoe, needs supervision    From Standing Position, Turn to Look Behind Over each Shoulder Turn sideways only but maintains balance    Turn 360 Degrees Able to turn 360 degrees safely in 4 seconds or less     Standing Unsupported, Alternately Place Feet on Step/Stool Able to stand independently and safely and complete 8 steps in 20 seconds    Standing Unsupported, One Foot in Front Able to plae foot ahead of the other independently and hold 30 seconds    Standing on One Leg Tries to lift leg/unable to hold 3 seconds but remains standing independently    Total Score 45          The Activities-Specific Balance Confidence (ABC) Scale 0% 10 20 30  40 50 60 70 80 90 100% No confidence<->completely confident  "How confident are you that you will not lose your balance or become unsteady when you . . .   Date tested 02/29/2024  Walk around the house 90%  2. Walk up or down stairs 90%  3. Bend over and pick up a slipper from in front of a closet floor 90%  4. Reach for a small can off a shelf at eye level 100%  5. Stand on tip toes and reach for something above your head 100%  6. Stand on a chair and reach for something 20%  7. Sweep the floor 100%  8. Walk outside the house to a car parked in the driveway 100%  9. Get into or out of a car 100%  10. Walk across a parking lot to the mall 100%  11. Walk up or down a ramp 80%  12. Walk in a crowded mall where people rapidly walk past you 100%  13. Are bumped into by people as you walk through the mall 90%  14. Step onto or off of an escalator while you are holding onto the railing 100%  15. Step onto or off an escalator  while holding onto parcels such that you cannot hold onto the railing 50%  16. Walk outside on icy sidewalks 80%  Total: #/16 86.9%       PATIENT EDUCATION: Education details: POC. Pt educated throughout session about proper posture and technique with exercises. Improved exercise technique, movement at target joints, use of target muscles after min to mod verbal, visual, tactile cues.  Person educated: Patient Education method: Explanation Education comprehension: verbalized understanding   HOME EXERCISE PROGRAM: Access Code:  [email or web address] URL: [email or web address] Date: 01/27/2024 Prepared by: Reyes London  Exercises - Seated Knee Flexion Stretch  - 1-2 x daily - 3 sets - 30 sec hold - Seated Hamstring Stretch  - 1-2 x daily - 3 sets - 30 hold - Standing Knee Flexion Stretch on Step  - 1-2 x daily - 3 sets - 30 sec hold - Standing Hamstring Stretch with Step  - 1-2 x daily - 3 sets - 30 hold    GOALS: Goals reviewed with patient? Yes  SHORT TERM GOALS: Target date: 02/23/2024      Patient will be independent in home exercise program to improve strength/mobility for better functional independence with ADLs. Baseline: No HEP currently; 02/29/2024= Patient reports compliant with HEP and now has a bar set up to hold onto with exercises. Will keep goal active to progress HEP Goal status: Progressing   LONG TERM GOALS: Target date: 04/19/2024    1.  Patient will complete five times sit to stand test in < 15 seconds indicating an increased LE strength and improved balance. Baseline: 32.05 sec heavy UE; 02/29/2024= 16.52 sec with heavy UE support Goal status: PROGRESSING  2.  Patient will improve ABC score to 90%   to demonstrate statistically significant improvement in mobility and quality of life as it relates to their balance.  Baseline: 85%; 02/29/2024=86.9% Goal status:    3.  Patient will increase Berg Balance score by > 6 points to demonstrate decreased fall risk during functional activities. Baseline: test visit 2; 01/27/2024= 43/56; 02/29/2024= 45/56 Goal status: PROGRESSING   4.   Patient will reduce timed up and go to <11 seconds to reduce fall risk and demonstrate improved transfer/gait ability. Baseline: 17.26 sec; 02/29/2024=15.29 sec avg without AD Goal status: PROGRESSING  5.   Patient will increase 10 meter walk test to >1.59m/s as to improve gait speed for better community ambulation and to reduce fall risk. Baseline: .86 m/s; 02/29/2024= 0.97 m/s Goal status:  PROGRESSING  6.  Patient will improve right knee range of motion to 110 degrees or greater in active flexion in order to allow him to improve right lower extremity ability to assist with various transitions including squats and sit to stands.  Baseline: 95 degrees active; 02/22/2024= 100 AROM, 104 terminal ROM.  Goal status: PROGRESSING    ASSESSMENT:  CLINICAL IMPRESSION:    Patient is an 83 year old male who presents to physical therapy for  treatment of balance impairments and lower extremity strength impairments. Patient presents with improved functional ability-  as seen by improved TUG score, 10 meter walk, and 5 x sit to stand. He demonstrated progress with balance as well as seen by improved BERG score. Overall, he is pleased with progress as well reporting moving around better at home. Although scores have improved- patient's condition has the potential to continue to improve in response to therapy. Maximum improvement is yet to be obtained. The anticipated improvement is attainable and reasonable in a generally predictable time. Patient will benefit from  skilled physical therapy to address the above impairments and to address the newly established therapy goals.  OBJECTIVE IMPAIRMENTS: Abnormal gait, decreased activity tolerance, decreased balance, decreased endurance, decreased mobility, difficulty walking, and decreased strength.   ACTIVITY LIMITATIONS: standing, squatting, stairs, and transfers  PARTICIPATION LIMITATIONS: shopping and community activity  PERSONAL FACTORS: Behavior pattern and 3+ comorbidities:  PMX: OA, Cancer, DM, HTN are also affecting patient's functional outcome.   REHAB POTENTIAL: Good  CLINICAL DECISION MAKING: Evolving/moderate complexity  EVALUATION COMPLEXITY: Moderate  PLAN:  PT FREQUENCY: 2x/week  PT DURATION: 12 weeks  PLANNED INTERVENTIONS: [id number]- Physical Performance Testing, 97110-Therapeutic exercises, 97530- Therapeutic activity, 97112-  Neuromuscular re-education, 97535- Self Care, [id number]- Manual therapy, [phone]- Gait training, Balance training, Stair training, Joint mobilization, Joint manipulation, Cryotherapy, and Moist heat  PLAN FOR NEXT SESSION:  Continue with knee ROM on R General LE strength and balance training - both static and dynamic within limits of knee pain Dynamic gait with head turns/scanning of environment STS without UE support - gradually decreasing seat height from 23'' this date Stair navigation: progress to step through pattern as appropriate Update HEP to include strengthening exercises   Reyes LOISE London, PT 02/29/2024, 2:19 PM

## 2024-03-02 ENCOUNTER — Ambulatory Visit: Attending: Internal Medicine | Admitting: Physical Therapy

## 2024-03-02 DIAGNOSIS — R2689 Other abnormalities of gait and mobility: Secondary | ICD-10-CM | POA: Insufficient documentation

## 2024-03-02 DIAGNOSIS — R262 Difficulty in walking, not elsewhere classified: Secondary | ICD-10-CM | POA: Diagnosis present

## 2024-03-02 DIAGNOSIS — R269 Unspecified abnormalities of gait and mobility: Secondary | ICD-10-CM | POA: Diagnosis present

## 2024-03-02 DIAGNOSIS — R2681 Unsteadiness on feet: Secondary | ICD-10-CM | POA: Insufficient documentation

## 2024-03-02 DIAGNOSIS — M6281 Muscle weakness (generalized): Secondary | ICD-10-CM | POA: Diagnosis present

## 2024-03-02 NOTE — Therapy (Signed)
 OUTPATIENT PHYSICAL THERAPY NEURO TREATMENT      Patient Name: Glen Jenkins MRN: [id number] DOB:Feb 13, 1941, 83 y.o., male Today's Date: 03/02/2024   PCP:   Auston Reyes BIRCH, MD   REFERRING PROVIDER:   Auston Reyes BIRCH, MD    END OF SESSION:   PT End of Session - 03/02/24 1104     Visit Number 11    Number of Visits 24    Date for Recertification  04/19/24    Progress Note Due on Visit 20    PT Start Time 1105    PT Stop Time 1146    PT Time Calculation (min) 41 min    Equipment Utilized During Treatment Gait belt    Activity Tolerance Patient tolerated treatment well;No increased pain    Behavior During Therapy WFL for tasks assessed/performed                Past Medical History:  Diagnosis Date   AAA (abdominal aortic aneurysm)    Anginal pain    Arthritis    lower back   Cancer (HCC)    Lung Cancer; Squamous Cell Carcinoma   Cardiomyopathy (HCC)    Coronary artery disease    CTS (carpal tunnel syndrome)    right - numbness thumb and 1st and 2nd fingers   Diabetes mellitus without complication (HCC)    Dysrhythmia    Atrial Fibrillation   GERD (gastroesophageal reflux disease)    Hypercholesterolemia    Hypertension    Nephropathy, diabetic (HCC)    Onychomycosis    Renal insufficiency    Sleep apnea    Varicose veins    Wears dentures    partial upper   Wears hearing aid in both ears    Past Surgical History:  Procedure Laterality Date   CATARACT EXTRACTION W/PHACO Left 06/27/2020   Procedure: CATARACT EXTRACTION PHACO AND INTRAOCULAR LENS PLACEMENT (IOC) LEFT DIABETIC 13.57 01:47.8 12.6%;  Surgeon: Mittie Gaskin, MD;  Location: [facility];  Service: Ophthalmology;  Laterality: Left;  Diabetic - oral meds   CATARACT EXTRACTION W/PHACO Right 07/11/2020   Procedure: CATARACT EXTRACTION PHACO AND INTRAOCULAR LENS PLACEMENT (IOC) RIGHT DIABETIC;  Surgeon: Mittie Gaskin, MD;  Location: [facility];  Service:  Ophthalmology;  Laterality: Right;  9.28 1:15.4 12.3%   COLONOSCOPY WITH PROPOFOL  N/A 09/28/2015   Procedure: COLONOSCOPY WITH PROPOFOL ;  Surgeon: Lamar ONEIDA Holmes, MD;  Location: [facility];  Service: Endoscopy;  Laterality: N/A;   CORONARY ANGIOPLASTY     Stent placement x 3 (1999, [id number])   TONSILLECTOMY     Patient Active Problem List   Diagnosis Date Noted   Bilateral carpal tunnel syndrome 06/16/2023   Class 2 severe obesity due to excess calories with serious comorbidity and body mass index (BMI) of 37.0 to 37.9 in adult 03/12/2023   Pain due to onychomycosis of toenails of both feet 05/05/2022   Coronary atherosclerosis 02/13/2020   Hypercholesterolemia 02/13/2020   Localized edema 02/13/2020   Proteinuria 02/13/2020   Abdominal aortic aneurysm 02/13/2020   Angina pectoris 02/13/2020   Atrial fibrillation (HCC) 02/13/2020   Cardiomyopathy (HCC) 02/13/2020   Edema of lower extremity 08/08/2019   Essential hypertension 08/08/2019   Macular degeneration 10/20/2018   Bleeding hemorrhoids 02/09/2017   CKD (chronic kidney disease) stage 3, GFR 30-59 ml/min (HCC) 01/04/2016   Type 2 diabetes mellitus (HCC) 09/18/2014    ONSET DATE: 3-4 years progressive   REFERRING DIAG: R29.898 (ICD-10-CM) - Leg weakness, bilateral   THERAPY DIAG:  Difficulty in walking, not elsewhere classified  Abnormality of gait and mobility  Muscle weakness (generalized)  Other abnormalities of gait and mobility  Unsteadiness on feet  Rationale for Evaluation and Treatment: Rehabilitation  SUBJECTIVE:                                                                                                                                                                                             SUBJECTIVE STATEMENT:  Pt reports that his back is bothering him somewhat today, 3/10 pain in the lower back, pt attributes to poor eating habits. Pt reports he was also not able to sleep well last  night, as every time he had shooting pain down his L arm when laying down to sleep, but it improved every time he sat up.   Pt reports that he & his wife are joining [employer] today, with plan to go MWF.   Pt also reporting that tandem stance was most difficult at progress note; would like to be able to do for 5 sec. at d/c.    Pt accompanied by: self  PERTINENT HISTORY: PMX: OA, Cancer, DM, HTN  PAIN:  Are you having pain? No  PRECAUTIONS: None  RED FLAGS: None   WEIGHT BEARING RESTRICTIONS: No  FALLS: Has patient fallen in last 6 months? Yes. Number of falls 1  LIVING ENVIRONMENT: Lives with: lives with their spouse Lives in: House/apartment Stairs: Yes: External: 3 steps; can reach both Has following equipment at home: trecking pole   PLOF: Independent with household mobility with device and Independent with community mobility with device  PATIENT GOALS: Improve balance and strength   OBJECTIVE:   Note: Objective measures were completed at Evaluation unless otherwise noted.  DIAGNOSTIC FINDINGS: n/a  COGNITION: Overall cognitive status: Within functional limits for tasks assessed   SENSATION: Not tested    EDEMA:  Grade 1-2 Pitting edema noted on B Les, worse on R > L      POSTURE: rounded shoulders and forward head  LOWER EXTREMITY ROM:     Active  Right Eval Left Eval RIGHT  LE  02/22/2024  Hip flexion     Hip extension     Hip abduction     Hip adduction     Hip internal rotation     Hip external rotation     Knee flexion 95 degrees  100 degrees AROM & 104 degrees AROM when trying to reach terminal ROM  Knee extension     Ankle dorsiflexion     Ankle plantarflexion     Ankle inversion     Ankle eversion      (  Blank rows = not tested)  LOWER EXTREMITY MMT:    MMT Right Eval Left Eval  Hip flexion 4 4  Hip extension    Hip abduction 4+ 4+  Hip adduction 4+ 4+  Hip internal rotation    Hip external rotation    Knee flexion 4 4   Knee extension 4 4  Ankle dorsiflexion 4+ 4+  Ankle plantarflexion    Ankle inversion    Ankle eversion    (Blank rows = not tested)  BED MOBILITY:  No reported difficulty  TRANSFERS: Sit to stand: Modified independence  Assistive device utilized: UE support      Stand to sit: Modified independence  Assistive device utilized: heavy UE      Chair to chair: Modified independence  Assistive device utilized: heavy UE        STAIRS: Not tested GAIT: Findings: Gait Characteristics: poor foot clearance- Right and poor foot clearance- Left, Distance walked: 30 ft, and Comments:    FUNCTIONAL TESTS:  5 times sit to stand: 32.05 sec heavy UE Timed up and go (TUG): 17.26 sec no AD : 11.36 sec ( .88 m/s)  BERG: test visit 2   PATIENT SURVEYS:  ABC scale: The Activities-Specific Balance Confidence (ABC) Scale 0% 10 20 30  40 50 60 70 80 90 100% No confidence<->completely confident  "How confident are you that you will not lose your balance or become unsteady when you . . .   Date tested 01/26/24  Walk around the house 100%  2. Walk up or down stairs 70%  3. Bend over and pick up a slipper from in front of a closet floor 80%  4. Reach for a small can off a shelf at eye level 100%  5. Stand on tip toes and reach for something above your head 80%  6. Stand on a chair and reach for something 40%  7. Sweep the floor 90%  8. Walk outside the house to a car parked in the driveway 100%  9. Get into or out of a car 90%  10. Walk across a parking lot to the mall 100%  11. Walk up or down a ramp 100%  12. Walk in a crowded mall where people rapidly walk past you 90%  13. Are bumped into by people as you walk through the mall 80%  14. Step onto or off of an escalator while you are holding onto the railing 90%  15. Step onto or off an escalator while holding onto parcels such that you cannot hold onto the railing 70%  16. Walk outside on icy sidewalks 80%  Total: #/16 85%                                                                                                                                  TREATMENT DATE: 03/02/24    Assessed BP in seated: 128/65 (85), 51 bpm.   NuStep using both  BUE/BLE reciprocal movements to promote strength, coordination, endurance, and cardiorespiratory fitness. Performed levels 1-5, total of 8 min; .30 mi. PT monitored the patient's response to the intervention throughout.  72 bpm immediately following activity; recovering well to 67 bpm within ~2 min. Seated rest break Pt with some mild SOB with activity that also improved with seated rest break.   3x10 STS from 22'' mat table without UE support Reporting increased difficulty with last few repetitions of each set.  Verbal cueing for improved eccentric control, anterior weight shift when coming to stand/sit  2x10 each step ups with aerobic step + 1 purple base Verbal cueing for improved eccentric control Pt reporting increased difficulty with R LE as stance leg  Starting with BUE support at support bar for balance; able to progress to unilat UE support at bar for balance only.     PATIENT EDUCATION: Education details: POC. Pt educated throughout session about proper posture and technique with exercises. Improved exercise technique, movement at target joints, use of target muscles after min to mod verbal, visual, tactile cues.  Person educated: Patient Education method: Explanation Education comprehension: verbalized understanding   HOME EXERCISE PROGRAM: Access Code: [email or web address] URL: [email or web address] Date: 01/27/2024 Prepared by: Reyes London  Exercises - Seated Knee Flexion Stretch  - 1-2 x daily - 3 sets - 30 sec hold - Seated Hamstring Stretch  - 1-2 x daily - 3 sets - 30 hold - Standing Knee Flexion Stretch on Step  - 1-2 x daily - 3 sets - 30 sec hold - Standing Hamstring Stretch with Step  - 1-2 x daily - 3 sets - 30 hold    GOALS: Goals reviewed  with patient? Yes  SHORT TERM GOALS: Target date: 02/23/2024      Patient will be independent in home exercise program to improve strength/mobility for better functional independence with ADLs. Baseline: No HEP currently; 02/29/2024= Patient reports compliant with HEP and now has a bar set up to hold onto with exercises. Will keep goal active to progress HEP Goal status: Progressing   LONG TERM GOALS: Target date: 04/19/2024    1.  Patient will complete five times sit to stand test in < 15 seconds indicating an increased LE strength and improved balance. Baseline: 32.05 sec heavy UE; 02/29/2024= 16.52 sec with heavy UE support Goal status: PROGRESSING  2.  Patient will improve ABC score to 90%   to demonstrate statistically significant improvement in mobility and quality of life as it relates to their balance.  Baseline: 85%; 02/29/2024=86.9% Goal status:    3.  Patient will increase Berg Balance score by > 6 points to demonstrate decreased fall risk during functional activities. Baseline: test visit 2; 01/27/2024= 43/56; 02/29/2024= 45/56 Goal status: PROGRESSING   4.   Patient will reduce timed up and go to <11 seconds to reduce fall risk and demonstrate improved transfer/gait ability. Baseline: 17.26 sec; 02/29/2024=15.29 sec avg without AD Goal status: PROGRESSING  5.   Patient will increase 10 meter walk test to >1.17m/s as to improve gait speed for better community ambulation and to reduce fall risk. Baseline: .86 m/s; 02/29/2024= 0.97 m/s Goal status: PROGRESSING  6.  Patient will improve right knee range of motion to 110 degrees or greater in active flexion in order to allow him to improve right lower extremity ability to assist with various transitions including squats and sit to stands.  Baseline: 95 degrees active; 02/22/2024= 100 AROM, 104 terminal ROM.  Goal status: PROGRESSING  ASSESSMENT:  CLINICAL IMPRESSION:    Patient is an 83 year old male who presents to  physical therapy for  treatment of balance impairments and lower extremity strength impairments. Pt tolerating NuStep up to 8 min. to promote increased endurance with mild SOB at conclusion of activity with strong ability to recover with seated rest break. Continuing to decrease seat height with STS from mat table to 22'' this date, as pt continues to report getting up from lower surfaces is difficult; requiring verbal cueing for increased anterior weight shift & improved eccentric control. Pt tolerating step ups at aerobic step + purple base, continuing to report increased difficulty with RLE as stance leg compared to LLE. Patient will benefit from skilled physical therapy to address the above impairments and to address the newly established therapy goals.  OBJECTIVE IMPAIRMENTS: Abnormal gait, decreased activity tolerance, decreased balance, decreased endurance, decreased mobility, difficulty walking, and decreased strength.   ACTIVITY LIMITATIONS: standing, squatting, stairs, and transfers  PARTICIPATION LIMITATIONS: shopping and community activity  PERSONAL FACTORS: Behavior pattern and 3+ comorbidities:  PMX: OA, Cancer, DM, HTN are also affecting patient's functional outcome.   REHAB POTENTIAL: Good  CLINICAL DECISION MAKING: Evolving/moderate complexity  EVALUATION COMPLEXITY: Moderate  PLAN:  PT FREQUENCY: 2x/week  PT DURATION: 12 weeks  PLANNED INTERVENTIONS: 97750- Physical Performance Testing, 97110-Therapeutic exercises, 97530- Therapeutic activity, 97112- Neuromuscular re-education, 97535- Self Care, [id number]- Manual therapy, [phone]- Gait training, Balance training, Stair training, Joint mobilization, Joint manipulation, Cryotherapy, and Moist heat  PLAN FOR NEXT SESSION:  Continue with knee ROM on R General LE strength and balance training - both static and dynamic within limits of knee pain Tandem stance Dynamic gait with head turns/scanning of environment STS without UE  support - gradually decreasing seat height from 22'' this date Stair navigation: progress to step through pattern as appropriate Update HEP to include strengthening exercises   Chiquita Silvan, Student-PT 03/02/2024, 2:32 PM

## 2024-03-07 ENCOUNTER — Ambulatory Visit: Admitting: Physical Therapy

## 2024-03-07 DIAGNOSIS — R2681 Unsteadiness on feet: Secondary | ICD-10-CM

## 2024-03-07 DIAGNOSIS — R262 Difficulty in walking, not elsewhere classified: Secondary | ICD-10-CM

## 2024-03-07 DIAGNOSIS — R269 Unspecified abnormalities of gait and mobility: Secondary | ICD-10-CM

## 2024-03-07 DIAGNOSIS — R2689 Other abnormalities of gait and mobility: Secondary | ICD-10-CM

## 2024-03-07 DIAGNOSIS — M6281 Muscle weakness (generalized): Secondary | ICD-10-CM

## 2024-03-07 NOTE — Therapy (Signed)
 OUTPATIENT PHYSICAL THERAPY NEURO TREATMENT      Patient Name: Glen Jenkins MRN: [id number] DOB:08/28/1940, 83 y.o., male Today's Date: 03/07/2024   PCP:   Auston Reyes BIRCH, MD   REFERRING PROVIDER:   Auston Reyes BIRCH, MD    END OF SESSION:   PT End of Session - 03/07/24 1548     Visit Number 12    Number of Visits 24    Date for Recertification  04/19/24    Progress Note Due on Visit 20    PT Start Time 1544    PT Stop Time 1622    PT Time Calculation (min) 38 min    Equipment Utilized During Treatment Gait belt    Activity Tolerance Patient tolerated treatment well;No increased pain    Behavior During Therapy WFL for tasks assessed/performed                 Past Medical History:  Diagnosis Date   AAA (abdominal aortic aneurysm)    Anginal pain    Arthritis    lower back   Cancer (HCC)    Lung Cancer; Squamous Cell Carcinoma   Cardiomyopathy (HCC)    Coronary artery disease    CTS (carpal tunnel syndrome)    right - numbness thumb and 1st and 2nd fingers   Diabetes mellitus without complication (HCC)    Dysrhythmia    Atrial Fibrillation   GERD (gastroesophageal reflux disease)    Hypercholesterolemia    Hypertension    Nephropathy, diabetic (HCC)    Onychomycosis    Renal insufficiency    Sleep apnea    Varicose veins    Wears dentures    partial upper   Wears hearing aid in both ears    Past Surgical History:  Procedure Laterality Date   CATARACT EXTRACTION W/PHACO Left 06/27/2020   Procedure: CATARACT EXTRACTION PHACO AND INTRAOCULAR LENS PLACEMENT (IOC) LEFT DIABETIC 13.57 01:47.8 12.6%;  Surgeon: Mittie Gaskin, MD;  Location: [facility];  Service: Ophthalmology;  Laterality: Left;  Diabetic - oral meds   CATARACT EXTRACTION W/PHACO Right 07/11/2020   Procedure: CATARACT EXTRACTION PHACO AND INTRAOCULAR LENS PLACEMENT (IOC) RIGHT DIABETIC;  Surgeon: Mittie Gaskin, MD;  Location: [facility];  Service:  Ophthalmology;  Laterality: Right;  9.28 1:15.4 12.3%   COLONOSCOPY WITH PROPOFOL  N/A 09/28/2015   Procedure: COLONOSCOPY WITH PROPOFOL ;  Surgeon: Lamar ONEIDA Holmes, MD;  Location: [facility];  Service: Endoscopy;  Laterality: N/A;   CORONARY ANGIOPLASTY     Stent placement x 3 (1999, [id number])   TONSILLECTOMY     Patient Active Problem List   Diagnosis Date Noted   Bilateral carpal tunnel syndrome 06/16/2023   Class 2 severe obesity due to excess calories with serious comorbidity and body mass index (BMI) of 37.0 to 37.9 in adult 03/12/2023   Pain due to onychomycosis of toenails of both feet 05/05/2022   Coronary atherosclerosis 02/13/2020   Hypercholesterolemia 02/13/2020   Localized edema 02/13/2020   Proteinuria 02/13/2020   Abdominal aortic aneurysm 02/13/2020   Angina pectoris 02/13/2020   Atrial fibrillation (HCC) 02/13/2020   Cardiomyopathy (HCC) 02/13/2020   Edema of lower extremity 08/08/2019   Essential hypertension 08/08/2019   Macular degeneration 10/20/2018   Bleeding hemorrhoids 02/09/2017   CKD (chronic kidney disease) stage 3, GFR 30-59 ml/min (HCC) 01/04/2016   Type 2 diabetes mellitus (HCC) 09/18/2014    ONSET DATE: 3-4 years progressive   REFERRING DIAG: R29.898 (ICD-10-CM) - Leg weakness, bilateral   THERAPY  DIAG:  Difficulty in walking, not elsewhere classified  Abnormality of gait and mobility  Muscle weakness (generalized)  Other abnormalities of gait and mobility  Unsteadiness on feet  Rationale for Evaluation and Treatment: Rehabilitation  SUBJECTIVE:                                                                                                                                                                                             SUBJECTIVE STATEMENT:   Pt is apologetic for late arrival.  He was busy with work and lost track of time.  Pt accompanied by: self  PERTINENT HISTORY: PMX: OA, Cancer, DM, HTN  PAIN:  Are you  having pain? No  PRECAUTIONS: None  RED FLAGS: None   WEIGHT BEARING RESTRICTIONS: No  FALLS: Has patient fallen in last 6 months? Yes. Number of falls 1  LIVING ENVIRONMENT: Lives with: lives with their spouse Lives in: House/apartment Stairs: Yes: External: 3 steps; can reach both Has following equipment at home: trecking pole   PLOF: Independent with household mobility with device and Independent with community mobility with device  PATIENT GOALS: Improve balance and strength   OBJECTIVE:   Note: Objective measures were completed at Evaluation unless otherwise noted.  DIAGNOSTIC FINDINGS: n/a  COGNITION: Overall cognitive status: Within functional limits for tasks assessed   SENSATION: Not tested    EDEMA:  Grade 1-2 Pitting edema noted on B Les, worse on R > L      POSTURE: rounded shoulders and forward head  LOWER EXTREMITY ROM:     Active  Right Eval Left Eval RIGHT  LE  02/22/2024  Hip flexion     Hip extension     Hip abduction     Hip adduction     Hip internal rotation     Hip external rotation     Knee flexion 95 degrees  100 degrees AROM & 104 degrees AROM when trying to reach terminal ROM  Knee extension     Ankle dorsiflexion     Ankle plantarflexion     Ankle inversion     Ankle eversion      (Blank rows = not tested)  LOWER EXTREMITY MMT:    MMT Right Eval Left Eval  Hip flexion 4 4  Hip extension    Hip abduction 4+ 4+  Hip adduction 4+ 4+  Hip internal rotation    Hip external rotation    Knee flexion 4 4  Knee extension 4 4  Ankle dorsiflexion 4+ 4+  Ankle plantarflexion    Ankle inversion    Ankle eversion    (Blank rows =  not tested)  BED MOBILITY:  No reported difficulty  TRANSFERS: Sit to stand: Modified independence  Assistive device utilized: UE support      Stand to sit: Modified independence  Assistive device utilized: heavy UE      Chair to chair: Modified independence  Assistive device utilized: heavy  UE        STAIRS: Not tested GAIT: Findings: Gait Characteristics: poor foot clearance- Right and poor foot clearance- Left, Distance walked: 30 ft, and Comments:    FUNCTIONAL TESTS:  5 times sit to stand: 32.05 sec heavy UE Timed up and go (TUG): 17.26 sec no AD : 11.36 sec ( .88 m/s)  BERG: test visit 2   PATIENT SURVEYS:  ABC scale: The Activities-Specific Balance Confidence (ABC) Scale 0% 10 20 30  40 50 60 70 80 90 100% No confidence<->completely confident  "How confident are you that you will not lose your balance or become unsteady when you . . .   Date tested 01/26/24  Walk around the house 100%  2. Walk up or down stairs 70%  3. Bend over and pick up a slipper from in front of a closet floor 80%  4. Reach for a small can off a shelf at eye level 100%  5. Stand on tip toes and reach for something above your head 80%  6. Stand on a chair and reach for something 40%  7. Sweep the floor 90%  8. Walk outside the house to a car parked in the driveway 100%  9. Get into or out of a car 90%  10. Walk across a parking lot to the mall 100%  11. Walk up or down a ramp 100%  12. Walk in a crowded mall where people rapidly walk past you 90%  13. Are bumped into by people as you walk through the mall 80%  14. Step onto or off of an escalator while you are holding onto the railing 90%  15. Step onto or off an escalator while holding onto parcels such that you cannot hold onto the railing 70%  16. Walk outside on icy sidewalks 80%  Total: #/16 85%                                                                                                                                 TREATMENT DATE: 03/07/24     TA- To improve functional movements patterns for everyday tasks   NuStep using both BUE/BLE reciprocal movements to promote strength, coordination, endurance, and cardiorespiratory fitness. Performed level 3 x 8 min - pt having pain at start of session so interval training on  nustep was with held PT monitored the patient's response to the intervention throughout.    x12 STS from 22'' mat table without UE support Reporting increased difficulty with last few repetitions of each set.  Verbal cueing for improved eccentric control, anterior weight shift when coming to stand/sit  2 x  10 from 21.5 in mat table without UE support   Forward and retro gait x 2 min with intermittent switching form one to the other. -cues for posture  X 10 ea LE step up on 6 in step without UE support   2 x 10 ea LE lateral step up to step trainer - no UE on first set and B UE on second set on his LLE   TE- To improve strength, endurance, mobility, and function of specific targeted muscle groups or improve joint range of motion or improve muscle flexibility  Seated LAQ on elevated mat table with increased knee flexion to end range after each rep x 10 ea with 3# AW   NMR: To facilitate reeducation of movement, balance, posture, coordination, and/or proprioception/kinesthetic sense.  1LE on airex other on step 2 x 30 sec ea      PATIENT EDUCATION: Education details: POC. Pt educated throughout session about proper posture and technique with exercises. Improved exercise technique, movement at target joints, use of target muscles after min to mod verbal, visual, tactile cues.  Person educated: Patient Education method: Explanation Education comprehension: verbalized understanding   HOME EXERCISE PROGRAM: Access Code: [email or web address] URL: [email or web address] Date: 01/27/2024 Prepared by: Reyes London  Exercises - Seated Knee Flexion Stretch  - 1-2 x daily - 3 sets - 30 sec hold - Seated Hamstring Stretch  - 1-2 x daily - 3 sets - 30 hold - Standing Knee Flexion Stretch on Step  - 1-2 x daily - 3 sets - 30 sec hold - Standing Hamstring Stretch with Step  - 1-2 x daily - 3 sets - 30 hold    GOALS: Goals reviewed with patient? Yes  SHORT TERM GOALS: Target  date: 02/23/2024      Patient will be independent in home exercise program to improve strength/mobility for better functional independence with ADLs. Baseline: No HEP currently; 02/29/2024= Patient reports compliant with HEP and now has a bar set up to hold onto with exercises. Will keep goal active to progress HEP Goal status: Progressing   LONG TERM GOALS: Target date: 04/19/2024    1.  Patient will complete five times sit to stand test in < 15 seconds indicating an increased LE strength and improved balance. Baseline: 32.05 sec heavy UE; 02/29/2024= 16.52 sec with heavy UE support Goal status: PROGRESSING  2.  Patient will improve ABC score to 90%   to demonstrate statistically significant improvement in mobility and quality of life as it relates to their balance.  Baseline: 85%; 02/29/2024=86.9% Goal status:    3.  Patient will increase Berg Balance score by > 6 points to demonstrate decreased fall risk during functional activities. Baseline: test visit 2; 01/27/2024= 43/56; 02/29/2024= 45/56 Goal status: PROGRESSING   4.   Patient will reduce timed up and go to <11 seconds to reduce fall risk and demonstrate improved transfer/gait ability. Baseline: 17.26 sec; 02/29/2024=15.29 sec avg without AD Goal status: PROGRESSING  5.   Patient will increase 10 meter walk test to >1.20m/s as to improve gait speed for better community ambulation and to reduce fall risk. Baseline: .86 m/s; 02/29/2024= 0.97 m/s Goal status: PROGRESSING  6.  Patient will improve right knee range of motion to 110 degrees or greater in active flexion in order to allow him to improve right lower extremity ability to assist with various transitions including squats and sit to stands.  Baseline: 95 degrees active; 02/22/2024= 100 AROM, 104 terminal ROM.  Goal status: PROGRESSING  ASSESSMENT:  CLINICAL IMPRESSION:    Patient presents with good motivation for completion of physical therapy activities. Pt  progressed with functional movement training and was able to complete reps at decreased height with STS surface. Pt showing improved balance responses on airex this date. Pt will continue to benefit from skilled physical therapy intervention to address impairments, improve QOL, and attain therapy goals.    OBJECTIVE IMPAIRMENTS: Abnormal gait, decreased activity tolerance, decreased balance, decreased endurance, decreased mobility, difficulty walking, and decreased strength.   ACTIVITY LIMITATIONS: standing, squatting, stairs, and transfers  PARTICIPATION LIMITATIONS: shopping and community activity  PERSONAL FACTORS: Behavior pattern and 3+ comorbidities:  PMX: OA, Cancer, DM, HTN are also affecting patient's functional outcome.   REHAB POTENTIAL: Good  CLINICAL DECISION MAKING: Evolving/moderate complexity  EVALUATION COMPLEXITY: Moderate  PLAN:  PT FREQUENCY: 2x/week  PT DURATION: 12 weeks  PLANNED INTERVENTIONS: 97750- Physical Performance Testing, 97110-Therapeutic exercises, 97530- Therapeutic activity, 97112- Neuromuscular re-education, 97535- Self Care, [id number]- Manual therapy, [id number]- Gait training, Balance training, Stair training, Joint mobilization, Joint manipulation, Cryotherapy, and Moist heat  PLAN FOR NEXT SESSION:   Continue with knee ROM on R General LE strength and balance training - both static and dynamic within limits of knee pain Tandem stance Dynamic gait with head turns/scanning of environment STS without UE support - gradually decreasing seat height from 21.5'' this date Stair navigation: progress to step through pattern as appropriate Update HEP to include strengthening exercises   Lonni KATHEE Gainer, PT 03/07/2024, 4:56 PM

## 2024-03-09 ENCOUNTER — Ambulatory Visit

## 2024-03-09 DIAGNOSIS — R262 Difficulty in walking, not elsewhere classified: Secondary | ICD-10-CM | POA: Diagnosis not present

## 2024-03-09 DIAGNOSIS — R269 Unspecified abnormalities of gait and mobility: Secondary | ICD-10-CM

## 2024-03-09 DIAGNOSIS — R2681 Unsteadiness on feet: Secondary | ICD-10-CM

## 2024-03-09 DIAGNOSIS — M6281 Muscle weakness (generalized): Secondary | ICD-10-CM

## 2024-03-09 DIAGNOSIS — R2689 Other abnormalities of gait and mobility: Secondary | ICD-10-CM

## 2024-03-09 NOTE — Therapy (Signed)
 OUTPATIENT PHYSICAL THERAPY NEURO TREATMENT      Patient Name: Glen Jenkins MRN: [id number] DOB:Apr 20, 1941, 83 y.o., male Today's Date: 03/10/2024   PCP:   Auston Reyes BIRCH, MD   REFERRING PROVIDER:   Auston Reyes BIRCH, MD    END OF SESSION:   PT End of Session - 03/09/24 1539     Visit Number 13    Number of Visits 24    Date for Recertification  04/19/24    Progress Note Due on Visit 20    PT Start Time 1500    PT Stop Time 1543    PT Time Calculation (min) 43 min    Equipment Utilized During Treatment Gait belt    Activity Tolerance Patient tolerated treatment well;No increased pain    Behavior During Therapy WFL for tasks assessed/performed                  Past Medical History:  Diagnosis Date   AAA (abdominal aortic aneurysm)    Anginal pain    Arthritis    lower back   Cancer (HCC)    Lung Cancer; Squamous Cell Carcinoma   Cardiomyopathy (HCC)    Coronary artery disease    CTS (carpal tunnel syndrome)    right - numbness thumb and 1st and 2nd fingers   Diabetes mellitus without complication (HCC)    Dysrhythmia    Atrial Fibrillation   GERD (gastroesophageal reflux disease)    Hypercholesterolemia    Hypertension    Nephropathy, diabetic (HCC)    Onychomycosis    Renal insufficiency    Sleep apnea    Varicose veins    Wears dentures    partial upper   Wears hearing aid in both ears    Past Surgical History:  Procedure Laterality Date   CATARACT EXTRACTION W/PHACO Left 06/27/2020   Procedure: CATARACT EXTRACTION PHACO AND INTRAOCULAR LENS PLACEMENT (IOC) LEFT DIABETIC 13.57 01:47.8 12.6%;  Surgeon: Mittie Gaskin, MD;  Location: [facility];  Service: Ophthalmology;  Laterality: Left;  Diabetic - oral meds   CATARACT EXTRACTION W/PHACO Right 07/11/2020   Procedure: CATARACT EXTRACTION PHACO AND INTRAOCULAR LENS PLACEMENT (IOC) RIGHT DIABETIC;  Surgeon: Mittie Gaskin, MD;  Location: [facility];  Service:  Ophthalmology;  Laterality: Right;  9.28 1:15.4 12.3%   COLONOSCOPY WITH PROPOFOL  N/A 09/28/2015   Procedure: COLONOSCOPY WITH PROPOFOL ;  Surgeon: Lamar ONEIDA Holmes, MD;  Location: [facility];  Service: Endoscopy;  Laterality: N/A;   CORONARY ANGIOPLASTY     Stent placement x 3 (1999, [id number])   TONSILLECTOMY     Patient Active Problem List   Diagnosis Date Noted   Bilateral carpal tunnel syndrome 06/16/2023   Class 2 severe obesity due to excess calories with serious comorbidity and body mass index (BMI) of 37.0 to 37.9 in adult 03/12/2023   Pain due to onychomycosis of toenails of both feet 05/05/2022   Coronary atherosclerosis 02/13/2020   Hypercholesterolemia 02/13/2020   Localized edema 02/13/2020   Proteinuria 02/13/2020   Abdominal aortic aneurysm 02/13/2020   Angina pectoris 02/13/2020   Atrial fibrillation (HCC) 02/13/2020   Cardiomyopathy (HCC) 02/13/2020   Edema of lower extremity 08/08/2019   Essential hypertension 08/08/2019   Macular degeneration 10/20/2018   Bleeding hemorrhoids 02/09/2017   CKD (chronic kidney disease) stage 3, GFR 30-59 ml/min (HCC) 01/04/2016   Type 2 diabetes mellitus (HCC) 09/18/2014    ONSET DATE: 3-4 years progressive   REFERRING DIAG: R29.898 (ICD-10-CM) - Leg weakness, bilateral  THERAPY DIAG:  Difficulty in walking, not elsewhere classified  Abnormality of gait and mobility  Muscle weakness (generalized)  Other abnormalities of gait and mobility  Unsteadiness on feet  Rationale for Evaluation and Treatment: Rehabilitation  SUBJECTIVE:                                                                                                                                                                                             SUBJECTIVE STATEMENT:   Patient reports he almost canceled but decided to come. Reports no specific changes since last visit.    Pt accompanied by: self  PERTINENT HISTORY: PMX: OA, Cancer, DM,  HTN  PAIN:  Are you having pain? No  PRECAUTIONS: None  RED FLAGS: None   WEIGHT BEARING RESTRICTIONS: No  FALLS: Has patient fallen in last 6 months? Yes. Number of falls 1  LIVING ENVIRONMENT: Lives with: lives with their spouse Lives in: House/apartment Stairs: Yes: External: 3 steps; can reach both Has following equipment at home: trecking pole   PLOF: Independent with household mobility with device and Independent with community mobility with device  PATIENT GOALS: Improve balance and strength   OBJECTIVE:   Note: Objective measures were completed at Evaluation unless otherwise noted.  DIAGNOSTIC FINDINGS: n/a  COGNITION: Overall cognitive status: Within functional limits for tasks assessed   SENSATION: Not tested    EDEMA:  Grade 1-2 Pitting edema noted on B Les, worse on R > L      POSTURE: rounded shoulders and forward head  LOWER EXTREMITY ROM:     Active  Right Eval Left Eval RIGHT  LE  02/22/2024  Hip flexion     Hip extension     Hip abduction     Hip adduction     Hip internal rotation     Hip external rotation     Knee flexion 95 degrees  100 degrees AROM & 104 degrees AROM when trying to reach terminal ROM  Knee extension     Ankle dorsiflexion     Ankle plantarflexion     Ankle inversion     Ankle eversion      (Blank rows = not tested)  LOWER EXTREMITY MMT:    MMT Right Eval Left Eval  Hip flexion 4 4  Hip extension    Hip abduction 4+ 4+  Hip adduction 4+ 4+  Hip internal rotation    Hip external rotation    Knee flexion 4 4  Knee extension 4 4  Ankle dorsiflexion 4+ 4+  Ankle plantarflexion    Ankle inversion    Ankle eversion    (  Blank rows = not tested)  BED MOBILITY:  No reported difficulty  TRANSFERS: Sit to stand: Modified independence  Assistive device utilized: UE support      Stand to sit: Modified independence  Assistive device utilized: heavy UE      Chair to chair: Modified independence  Assistive  device utilized: heavy UE        STAIRS: Not tested GAIT: Findings: Gait Characteristics: poor foot clearance- Right and poor foot clearance- Left, Distance walked: 30 ft, and Comments:    FUNCTIONAL TESTS:  5 times sit to stand: 32.05 sec heavy UE Timed up and go (TUG): 17.26 sec no AD : 11.36 sec ( .88 m/s)  BERG: test visit 2   PATIENT SURVEYS:  ABC scale: The Activities-Specific Balance Confidence (ABC) Scale 0% 10 20 30  40 50 60 70 80 90 100% No confidence<->completely confident  "How confident are you that you will not lose your balance or become unsteady when you . . .   Date tested 01/26/24  Walk around the house 100%  2. Walk up or down stairs 70%  3. Bend over and pick up a slipper from in front of a closet floor 80%  4. Reach for a small can off a shelf at eye level 100%  5. Stand on tip toes and reach for something above your head 80%  6. Stand on a chair and reach for something 40%  7. Sweep the floor 90%  8. Walk outside the house to a car parked in the driveway 100%  9. Get into or out of a car 90%  10. Walk across a parking lot to the mall 100%  11. Walk up or down a ramp 100%  12. Walk in a crowded mall where people rapidly walk past you 90%  13. Are bumped into by people as you walk through the mall 80%  14. Step onto or off of an escalator while you are holding onto the railing 90%  15. Step onto or off an escalator while holding onto parcels such that you cannot hold onto the railing 70%  16. Walk outside on icy sidewalks 80%  Total: #/16 85%                                                                                                                                 TREATMENT DATE: 03/10/24     TA- To improve functional movements patterns for everyday tasks   -NuStep using both BUE/BLE reciprocal movements to promote strength, coordination, endurance, and cardiorespiratory fitness. Performed level 1 x 6 min -    -Step up onto 5 block with 3#AW  x 15 reps each LE with BUE Support  - STS from 25'' mat table without UE support 2 x 10 reps Verbal cueing for improved eccentric control, anterior weight shift when coming to stand/sit  -Side stepping up/over 1/2 foam roll at support 3# AW  -Forward and retro  step up/over 1/2 foam with 3# AW x 15 reps  -Heel to toe gait gait sequencing activity- swinging one LE forward - striking heel on floor and toe box onto 1/2 foam with 3# AW  TE- To improve strength, endurance, mobility, and function of specific targeted muscle groups or improve joint range of motion or improve muscle flexibility  Seated LAQ 2 x 10 ea with 3# AW        PATIENT EDUCATION: Education details: POC. Pt educated throughout session about proper posture and technique with exercises. Improved exercise technique, movement at target joints, use of target muscles after min to mod verbal, visual, tactile cues.  Person educated: Patient Education method: Explanation Education comprehension: verbalized understanding   HOME EXERCISE PROGRAM: Access Code: [email or web address] URL: [email or web address] Date: 01/27/2024 Prepared by: Reyes London  Exercises - Seated Knee Flexion Stretch  - 1-2 x daily - 3 sets - 30 sec hold - Seated Hamstring Stretch  - 1-2 x daily - 3 sets - 30 hold - Standing Knee Flexion Stretch on Step  - 1-2 x daily - 3 sets - 30 sec hold - Standing Hamstring Stretch with Step  - 1-2 x daily - 3 sets - 30 hold    GOALS: Goals reviewed with patient? Yes  SHORT TERM GOALS: Target date: 02/23/2024      Patient will be independent in home exercise program to improve strength/mobility for better functional independence with ADLs. Baseline: No HEP currently; 02/29/2024= Patient reports compliant with HEP and now has a bar set up to hold onto with exercises. Will keep goal active to progress HEP Goal status: Progressing   LONG TERM GOALS: Target date: 04/19/2024    1.  Patient will  complete five times sit to stand test in < 15 seconds indicating an increased LE strength and improved balance. Baseline: 32.05 sec heavy UE; 02/29/2024= 16.52 sec with heavy UE support Goal status: PROGRESSING  2.  Patient will improve ABC score to 90%   to demonstrate statistically significant improvement in mobility and quality of life as it relates to their balance.  Baseline: 85%; 02/29/2024=86.9% Goal status:    3.  Patient will increase Berg Balance score by > 6 points to demonstrate decreased fall risk during functional activities. Baseline: test visit 2; 01/27/2024= 43/56; 02/29/2024= 45/56 Goal status: PROGRESSING   4.   Patient will reduce timed up and go to <11 seconds to reduce fall risk and demonstrate improved transfer/gait ability. Baseline: 17.26 sec; 02/29/2024=15.29 sec avg without AD Goal status: PROGRESSING  5.   Patient will increase 10 meter walk test to >1.63m/s as to improve gait speed for better community ambulation and to reduce fall risk. Baseline: .86 m/s; 02/29/2024= 0.97 m/s Goal status: PROGRESSING  6.  Patient will improve right knee range of motion to 110 degrees or greater in active flexion in order to allow him to improve right lower extremity ability to assist with various transitions including squats and sit to stands.  Baseline: 95 degrees active; 02/22/2024= 100 AROM, 104 terminal ROM.  Goal status: PROGRESSING    ASSESSMENT:  CLINICAL IMPRESSION:    Treatment continues to focus on LE strengthening and functional mobility training. Patient responded well overall-  able to improve heel to toe sequencing with VC. He was fatigued at end of session yet overall progressing with increased standing functional endurance.Pt will continue to benefit from skilled physical therapy intervention to address impairments, improve QOL, and attain therapy goals.    OBJECTIVE IMPAIRMENTS: Abnormal gait,  decreased activity tolerance, decreased balance, decreased  endurance, decreased mobility, difficulty walking, and decreased strength.   ACTIVITY LIMITATIONS: standing, squatting, stairs, and transfers  PARTICIPATION LIMITATIONS: shopping and community activity  PERSONAL FACTORS: Behavior pattern and 3+ comorbidities:  PMX: OA, Cancer, DM, HTN are also affecting patient's functional outcome.   REHAB POTENTIAL: Good  CLINICAL DECISION MAKING: Evolving/moderate complexity  EVALUATION COMPLEXITY: Moderate  PLAN:  PT FREQUENCY: 2x/week  PT DURATION: 12 weeks  PLANNED INTERVENTIONS: 97750- Physical Performance Testing, 97110-Therapeutic exercises, 97530- Therapeutic activity, 97112- Neuromuscular re-education, 97535- Self Care, [id number]- Manual therapy, [phone]- Gait training, Balance training, Stair training, Joint mobilization, Joint manipulation, Cryotherapy, and Moist heat  PLAN FOR NEXT SESSION:   Continue with knee ROM on R General LE strength and balance training - both static and dynamic within limits of knee pain Tandem stance Dynamic gait with head turns/scanning of environment STS without UE support - gradually decreasing seat height from 21.5'' this date Stair navigation: progress to step through pattern as appropriate Update HEP to include strengthening exercises   Reyes LOISE London, PT 03/10/2024, 10:01 PM

## 2024-03-14 ENCOUNTER — Ambulatory Visit

## 2024-03-14 DIAGNOSIS — R262 Difficulty in walking, not elsewhere classified: Secondary | ICD-10-CM | POA: Diagnosis not present

## 2024-03-14 DIAGNOSIS — R269 Unspecified abnormalities of gait and mobility: Secondary | ICD-10-CM

## 2024-03-14 DIAGNOSIS — R2681 Unsteadiness on feet: Secondary | ICD-10-CM

## 2024-03-14 DIAGNOSIS — M6281 Muscle weakness (generalized): Secondary | ICD-10-CM

## 2024-03-14 DIAGNOSIS — R2689 Other abnormalities of gait and mobility: Secondary | ICD-10-CM

## 2024-03-14 NOTE — Therapy (Signed)
 OUTPATIENT PHYSICAL THERAPY NEURO TREATMENT      Patient Name: Glen Jenkins MRN: [id number] DOB:1941/01/27, 83 y.o., male Today's Date: 03/14/2024   PCP:   Auston Reyes BIRCH, MD   REFERRING PROVIDER:   Auston Reyes BIRCH, MD    END OF SESSION:   PT End of Session - 03/14/24 1022     Visit Number 14    Number of Visits 24    Date for Recertification  04/19/24    Progress Note Due on Visit 20    PT Start Time 1016    PT Stop Time 1059    PT Time Calculation (min) 43 min    Equipment Utilized During Treatment Gait belt    Activity Tolerance Patient tolerated treatment well;No increased pain    Behavior During Therapy WFL for tasks assessed/performed                   Past Medical History:  Diagnosis Date   AAA (abdominal aortic aneurysm)    Anginal pain    Arthritis    lower back   Cancer (HCC)    Lung Cancer; Squamous Cell Carcinoma   Cardiomyopathy (HCC)    Coronary artery disease    CTS (carpal tunnel syndrome)    right - numbness thumb and 1st and 2nd fingers   Diabetes mellitus without complication (HCC)    Dysrhythmia    Atrial Fibrillation   GERD (gastroesophageal reflux disease)    Hypercholesterolemia    Hypertension    Nephropathy, diabetic (HCC)    Onychomycosis    Renal insufficiency    Sleep apnea    Varicose veins    Wears dentures    partial upper   Wears hearing aid in both ears    Past Surgical History:  Procedure Laterality Date   CATARACT EXTRACTION W/PHACO Left 06/27/2020   Procedure: CATARACT EXTRACTION PHACO AND INTRAOCULAR LENS PLACEMENT (IOC) LEFT DIABETIC 13.57 01:47.8 12.6%;  Surgeon: Mittie Gaskin, MD;  Location: [facility];  Service: Ophthalmology;  Laterality: Left;  Diabetic - oral meds   CATARACT EXTRACTION W/PHACO Right 07/11/2020   Procedure: CATARACT EXTRACTION PHACO AND INTRAOCULAR LENS PLACEMENT (IOC) RIGHT DIABETIC;  Surgeon: Mittie Gaskin, MD;  Location: [facility];  Service:  Ophthalmology;  Laterality: Right;  9.28 1:15.4 12.3%   COLONOSCOPY WITH PROPOFOL  N/A 09/28/2015   Procedure: COLONOSCOPY WITH PROPOFOL ;  Surgeon: Lamar ONEIDA Holmes, MD;  Location: [facility];  Service: Endoscopy;  Laterality: N/A;   CORONARY ANGIOPLASTY     Stent placement x 3 (1999, [id number])   TONSILLECTOMY     Patient Active Problem List   Diagnosis Date Noted   Bilateral carpal tunnel syndrome 06/16/2023   Class 2 severe obesity due to excess calories with serious comorbidity and body mass index (BMI) of 37.0 to 37.9 in adult 03/12/2023   Pain due to onychomycosis of toenails of both feet 05/05/2022   Coronary atherosclerosis 02/13/2020   Hypercholesterolemia 02/13/2020   Localized edema 02/13/2020   Proteinuria 02/13/2020   Abdominal aortic aneurysm 02/13/2020   Angina pectoris 02/13/2020   Atrial fibrillation (HCC) 02/13/2020   Cardiomyopathy (HCC) 02/13/2020   Edema of lower extremity 08/08/2019   Essential hypertension 08/08/2019   Macular degeneration 10/20/2018   Bleeding hemorrhoids 02/09/2017   CKD (chronic kidney disease) stage 3, GFR 30-59 ml/min (HCC) 01/04/2016   Type 2 diabetes mellitus (HCC) 09/18/2014    ONSET DATE: 3-4 years progressive   REFERRING DIAG: R29.898 (ICD-10-CM) - Leg weakness, bilateral  THERAPY DIAG:  Difficulty in walking, not elsewhere classified  Abnormality of gait and mobility  Muscle weakness (generalized)  Other abnormalities of gait and mobility  Unsteadiness on feet  Rationale for Evaluation and Treatment: Rehabilitation  SUBJECTIVE:                                                                                                                                                                                             SUBJECTIVE STATEMENT:   Patient reports doing okay with no new issues. States has been trying to exercise more.   Pt accompanied by: self  PERTINENT HISTORY: PMX: OA, Cancer, DM, HTN  PAIN:  Are  you having pain? No  PRECAUTIONS: None  RED FLAGS: None   WEIGHT BEARING RESTRICTIONS: No  FALLS: Has patient fallen in last 6 months? Yes. Number of falls 1  LIVING ENVIRONMENT: Lives with: lives with their spouse Lives in: House/apartment Stairs: Yes: External: 3 steps; can reach both Has following equipment at home: trecking pole   PLOF: Independent with household mobility with device and Independent with community mobility with device  PATIENT GOALS: Improve balance and strength   OBJECTIVE:   Note: Objective measures were completed at Evaluation unless otherwise noted.  DIAGNOSTIC FINDINGS: n/a  COGNITION: Overall cognitive status: Within functional limits for tasks assessed   SENSATION: Not tested    EDEMA:  Grade 1-2 Pitting edema noted on B Les, worse on R > L      POSTURE: rounded shoulders and forward head  LOWER EXTREMITY ROM:     Active  Right Eval Left Eval RIGHT  LE  02/22/2024  Hip flexion     Hip extension     Hip abduction     Hip adduction     Hip internal rotation     Hip external rotation     Knee flexion 95 degrees  100 degrees AROM & 104 degrees AROM when trying to reach terminal ROM  Knee extension     Ankle dorsiflexion     Ankle plantarflexion     Ankle inversion     Ankle eversion      (Blank rows = not tested)  LOWER EXTREMITY MMT:    MMT Right Eval Left Eval  Hip flexion 4 4  Hip extension    Hip abduction 4+ 4+  Hip adduction 4+ 4+  Hip internal rotation    Hip external rotation    Knee flexion 4 4  Knee extension 4 4  Ankle dorsiflexion 4+ 4+  Ankle plantarflexion    Ankle inversion    Ankle eversion    (Blank rows =  not tested)  BED MOBILITY:  No reported difficulty  TRANSFERS: Sit to stand: Modified independence  Assistive device utilized: UE support      Stand to sit: Modified independence  Assistive device utilized: heavy UE      Chair to chair: Modified independence  Assistive device utilized:  heavy UE        STAIRS: Not tested GAIT: Findings: Gait Characteristics: poor foot clearance- Right and poor foot clearance- Left, Distance walked: 30 ft, and Comments:    FUNCTIONAL TESTS:  5 times sit to stand: 32.05 sec heavy UE Timed up and go (TUG): 17.26 sec no AD : 11.36 sec ( .88 m/s)  BERG: test visit 2   PATIENT SURVEYS:  ABC scale: The Activities-Specific Balance Confidence (ABC) Scale 0% 10 20 30  40 50 60 70 80 90 100% No confidence<->completely confident  "How confident are you that you will not lose your balance or become unsteady when you . . .   Date tested 01/26/24  Walk around the house 100%  2. Walk up or down stairs 70%  3. Bend over and pick up a slipper from in front of a closet floor 80%  4. Reach for a small can off a shelf at eye level 100%  5. Stand on tip toes and reach for something above your head 80%  6. Stand on a chair and reach for something 40%  7. Sweep the floor 90%  8. Walk outside the house to a car parked in the driveway 100%  9. Get into or out of a car 90%  10. Walk across a parking lot to the mall 100%  11. Walk up or down a ramp 100%  12. Walk in a crowded mall where people rapidly walk past you 90%  13. Are bumped into by people as you walk through the mall 80%  14. Step onto or off of an escalator while you are holding onto the railing 90%  15. Step onto or off an escalator while holding onto parcels such that you cannot hold onto the railing 70%  16. Walk outside on icy sidewalks 80%  Total: #/16 85%                                                                                                                                 TREATMENT DATE: 03/14/24     TA- To improve functional movements patterns for everyday tasks   -NuStep using both BUE/BLE reciprocal movements to promote strength, coordination, endurance, and cardiorespiratory fitness. Performed level 1-9 x 8 min -  Patient reported some fatigue yet not too bad    - STS from airex pad in chair without UE support 1st set x 6  reps(stopped due to fatigue) and then another 10 reps during 2nd set. Verbal cueing for improved eccentric control, anterior weight shift when coming to stand/sit  NMR: performed in // bars with CGA and use of gait  belt -Dynamic walk (high knees) 4#AW ea LE - down&back x 8- VC for increased height with LE's -Dynamic high knee marching in place (knee to raise and touch 1/2 foam roll place on top of railing) x 20 reps without UE support  -Step up onto 5 block with 4#AW x 15 reps each LE with BUE Support -Side stepping up/over 1/2 foam roll at support 4# AW x 20 reps each LE (VC to keep R foot pointed straight) - very fatiguing.  -Forward and retro step up/over 1/2 foam with 4# AW x 15 reps (difficulty clearing each LE)   -Resistive gait with 4# AW ea LE- 350 feet- focusing on increased step length and maintain good reciprocal gait pattern.          PATIENT EDUCATION: Education details: POC. Pt educated throughout session about proper posture and technique with exercises. Improved exercise technique, movement at target joints, use of target muscles after min to mod verbal, visual, tactile cues.  Person educated: Patient Education method: Explanation Education comprehension: verbalized understanding   HOME EXERCISE PROGRAM: Access Code: [email or web address] URL: [email or web address] Date: 01/27/2024 Prepared by: Reyes London  Exercises - Seated Knee Flexion Stretch  - 1-2 x daily - 3 sets - 30 sec hold - Seated Hamstring Stretch  - 1-2 x daily - 3 sets - 30 hold - Standing Knee Flexion Stretch on Step  - 1-2 x daily - 3 sets - 30 sec hold - Standing Hamstring Stretch with Step  - 1-2 x daily - 3 sets - 30 hold    GOALS: Goals reviewed with patient? Yes  SHORT TERM GOALS: Target date: 02/23/2024      Patient will be independent in home exercise program to improve strength/mobility for better  functional independence with ADLs. Baseline: No HEP currently; 02/29/2024= Patient reports compliant with HEP and now has a bar set up to hold onto with exercises. Will keep goal active to progress HEP Goal status: Progressing   LONG TERM GOALS: Target date: 04/19/2024    1.  Patient will complete five times sit to stand test in < 15 seconds indicating an increased LE strength and improved balance. Baseline: 32.05 sec heavy UE; 02/29/2024= 16.52 sec with heavy UE support Goal status: PROGRESSING  2.  Patient will improve ABC score to 90%   to demonstrate statistically significant improvement in mobility and quality of life as it relates to their balance.  Baseline: 85%; 02/29/2024=86.9% Goal status:    3.  Patient will increase Berg Balance score by > 6 points to demonstrate decreased fall risk during functional activities. Baseline: test visit 2; 01/27/2024= 43/56; 02/29/2024= 45/56 Goal status: PROGRESSING   4.   Patient will reduce timed up and go to <11 seconds to reduce fall risk and demonstrate improved transfer/gait ability. Baseline: 17.26 sec; 02/29/2024=15.29 sec avg without AD Goal status: PROGRESSING  5.   Patient will increase 10 meter walk test to >1.64m/s as to improve gait speed for better community ambulation and to reduce fall risk. Baseline: .86 m/s; 02/29/2024= 0.97 m/s Goal status: PROGRESSING  6.  Patient will improve right knee range of motion to 110 degrees or greater in active flexion in order to allow him to improve right lower extremity ability to assist with various transitions including squats and sit to stands.  Baseline: 95 degrees active; 02/22/2024= 100 AROM, 104 terminal ROM.  Goal status: PROGRESSING    ASSESSMENT:  CLINICAL IMPRESSION:    Patient arrived with good motivation for today's session. He  again presents with overall fatigue with most activities requiring quick rest breaks- limited hip flex and SLS with marching today yet later was able to  pick up with feet with resistive gait. He struggle to clear his feet with side stepping (exhibiting some hip weakness and difficulty with dynamic standing balance). He was fatigued again at end of session yet able to end with 2 laps of resistive gait without LOB. Pt will continue to benefit from skilled physical therapy intervention to address impairments, improve QOL, and attain therapy goals.    OBJECTIVE IMPAIRMENTS: Abnormal gait, decreased activity tolerance, decreased balance, decreased endurance, decreased mobility, difficulty walking, and decreased strength.   ACTIVITY LIMITATIONS: standing, squatting, stairs, and transfers  PARTICIPATION LIMITATIONS: shopping and community activity  PERSONAL FACTORS: Behavior pattern and 3+ comorbidities:  PMX: OA, Cancer, DM, HTN are also affecting patient's functional outcome.   REHAB POTENTIAL: Good  CLINICAL DECISION MAKING: Evolving/moderate complexity  EVALUATION COMPLEXITY: Moderate  PLAN:  PT FREQUENCY: 2x/week  PT DURATION: 12 weeks  PLANNED INTERVENTIONS: 97750- Physical Performance Testing, 97110-Therapeutic exercises, 97530- Therapeutic activity, 97112- Neuromuscular re-education, 97535- Self Care, [id number]- Manual therapy, [phone]- Gait training, Balance training, Stair training, Joint mobilization, Joint manipulation, Cryotherapy, and Moist heat  PLAN FOR NEXT SESSION:   Continue with knee ROM on R General LE strength and balance training - both static and dynamic within limits of knee pain Tandem stance Dynamic gait with head turns/scanning of environment STS without UE support - gradually decreasing seat height from 21.5'' this date Stair navigation: progress to step through pattern as appropriate Update HEP to include strengthening exercises   Reyes LOISE London, PT 03/14/2024, 4:16 PM

## 2024-03-16 ENCOUNTER — Ambulatory Visit: Admitting: Physical Therapy

## 2024-03-16 DIAGNOSIS — R2689 Other abnormalities of gait and mobility: Secondary | ICD-10-CM

## 2024-03-16 DIAGNOSIS — R262 Difficulty in walking, not elsewhere classified: Secondary | ICD-10-CM

## 2024-03-16 DIAGNOSIS — R269 Unspecified abnormalities of gait and mobility: Secondary | ICD-10-CM

## 2024-03-16 DIAGNOSIS — R2681 Unsteadiness on feet: Secondary | ICD-10-CM

## 2024-03-16 DIAGNOSIS — M6281 Muscle weakness (generalized): Secondary | ICD-10-CM

## 2024-03-16 NOTE — Therapy (Signed)
 OUTPATIENT PHYSICAL THERAPY NEURO TREATMENT      Patient Name: Glen Jenkins MRN: [id number] DOB:[birth year], 83 y.o., male Today's Date: 03/16/2024   PCP:   Auston Reyes BIRCH, MD   REFERRING PROVIDER:   Auston Reyes BIRCH, MD    END OF SESSION:   PT End of Session - 03/16/24 1109     Visit Number 15    Number of Visits 24    Date for Recertification  04/19/24    Progress Note Due on Visit 20    PT Start Time 1103    PT Stop Time 1142    PT Time Calculation (min) 39 min    Equipment Utilized During Treatment Gait belt    Activity Tolerance Patient tolerated treatment well;No increased pain    Behavior During Therapy WFL for tasks assessed/performed                    Past Medical History:  Diagnosis Date   AAA (abdominal aortic aneurysm)    Anginal pain    Arthritis    lower back   Cancer (HCC)    Lung Cancer; Squamous Cell Carcinoma   Cardiomyopathy (HCC)    Coronary artery disease    CTS (carpal tunnel syndrome)    right - numbness thumb and 1st and 2nd fingers   Diabetes mellitus without complication (HCC)    Dysrhythmia    Atrial Fibrillation   GERD (gastroesophageal reflux disease)    Hypercholesterolemia    Hypertension    Nephropathy, diabetic (HCC)    Onychomycosis    Renal insufficiency    Sleep apnea    Varicose veins    Wears dentures    partial upper   Wears hearing aid in both ears    Past Surgical History:  Procedure Laterality Date   CATARACT EXTRACTION W/PHACO Left 06/27/2020   Procedure: CATARACT EXTRACTION PHACO AND INTRAOCULAR LENS PLACEMENT (IOC) LEFT DIABETIC 13.57 01:47.8 12.6%;  Surgeon: Mittie Gaskin, MD;  Location: [facility];  Service: Ophthalmology;  Laterality: Left;  Diabetic - oral meds   CATARACT EXTRACTION W/PHACO Right 07/11/2020   Procedure: CATARACT EXTRACTION PHACO AND INTRAOCULAR LENS PLACEMENT (IOC) RIGHT DIABETIC;  Surgeon: Mittie Gaskin, MD;  Location: [facility];   Service: Ophthalmology;  Laterality: Right;  9.28 1:15.4 12.3%   COLONOSCOPY WITH PROPOFOL  N/A 09/28/2015   Procedure: COLONOSCOPY WITH PROPOFOL ;  Surgeon: Lamar ONEIDA Holmes, MD;  Location: [facility];  Service: Endoscopy;  Laterality: N/A;   CORONARY ANGIOPLASTY     Stent placement x 3 (1999, [id number])   TONSILLECTOMY     Patient Active Problem List   Diagnosis Date Noted   Bilateral carpal tunnel syndrome 06/16/2023   Class 2 severe obesity due to excess calories with serious comorbidity and body mass index (BMI) of 37.0 to 37.9 in adult 03/12/2023   Pain due to onychomycosis of toenails of both feet 05/05/2022   Coronary atherosclerosis 02/13/2020   Hypercholesterolemia 02/13/2020   Localized edema 02/13/2020   Proteinuria 02/13/2020   Abdominal aortic aneurysm 02/13/2020   Angina pectoris 02/13/2020   Atrial fibrillation (HCC) 02/13/2020   Cardiomyopathy (HCC) 02/13/2020   Edema of lower extremity 08/08/2019   Essential hypertension 08/08/2019   Macular degeneration 10/20/2018   Bleeding hemorrhoids 02/09/2017   CKD (chronic kidney disease) stage 3, GFR 30-59 ml/min (HCC) 01/04/2016   Type 2 diabetes mellitus (HCC) 09/18/2014    ONSET DATE: 3-4 years progressive   REFERRING DIAG: R29.898 (ICD-10-CM) - Leg weakness, bilateral  THERAPY DIAG:  No diagnosis found.  Rationale for Evaluation and Treatment: Rehabilitation  SUBJECTIVE:                                                                                                                                                                                             SUBJECTIVE STATEMENT:   Patient reports doing okay with no new issues. States has been trying to exercise more. Says his wife thoroughly enjoyed Pt here and was better than any other PT experience she has had.   Pt accompanied by: self  PERTINENT HISTORY: PMX: OA, Cancer, DM, HTN  PAIN:  Are you having pain? No  PRECAUTIONS: None  RED  FLAGS: None   WEIGHT BEARING RESTRICTIONS: No  FALLS: Has patient fallen in last 6 months? Yes. Number of falls 1  LIVING ENVIRONMENT: Lives with: lives with their spouse Lives in: House/apartment Stairs: Yes: External: 3 steps; can reach both Has following equipment at home: trecking pole   PLOF: Independent with household mobility with device and Independent with community mobility with device  PATIENT GOALS: Improve balance and strength   OBJECTIVE:   Note: Objective measures were completed at Evaluation unless otherwise noted.  DIAGNOSTIC FINDINGS: n/a  COGNITION: Overall cognitive status: Within functional limits for tasks assessed   SENSATION: Not tested    EDEMA:  Grade 1-2 Pitting edema noted on B Les, worse on R > L      POSTURE: rounded shoulders and forward head  LOWER EXTREMITY ROM:     Active  Right Eval Left Eval RIGHT  LE  02/22/2024  Hip flexion     Hip extension     Hip abduction     Hip adduction     Hip internal rotation     Hip external rotation     Knee flexion 95 degrees  100 degrees AROM & 104 degrees AROM when trying to reach terminal ROM  Knee extension     Ankle dorsiflexion     Ankle plantarflexion     Ankle inversion     Ankle eversion      (Blank rows = not tested)  LOWER EXTREMITY MMT:    MMT Right Eval Left Eval  Hip flexion 4 4  Hip extension    Hip abduction 4+ 4+  Hip adduction 4+ 4+  Hip internal rotation    Hip external rotation    Knee flexion 4 4  Knee extension 4 4  Ankle dorsiflexion 4+ 4+  Ankle plantarflexion    Ankle inversion    Ankle eversion    (Blank rows = not tested)  BED MOBILITY:  No reported difficulty  TRANSFERS: Sit to stand: Modified independence  Assistive device utilized: UE support      Stand to sit: Modified independence  Assistive device utilized: heavy UE      Chair to chair: Modified independence  Assistive device utilized: heavy UE        STAIRS: Not  tested GAIT: Findings: Gait Characteristics: poor foot clearance- Right and poor foot clearance- Left, Distance walked: 30 ft, and Comments:    FUNCTIONAL TESTS:  5 times sit to stand: 32.05 sec heavy UE Timed up and go (TUG): 17.26 sec no AD : 11.36 sec ( .88 m/s)  BERG: test visit 2   PATIENT SURVEYS:  ABC scale: The Activities-Specific Balance Confidence (ABC) Scale 0% 10 20 30  40 50 60 70 80 90 100% No confidence<->completely confident  "How confident are you that you will not lose your balance or become unsteady when you . . .   Date tested 01/26/24  Walk around the house 100%  2. Walk up or down stairs 70%  3. Bend over and pick up a slipper from in front of a closet floor 80%  4. Reach for a small can off a shelf at eye level 100%  5. Stand on tip toes and reach for something above your head 80%  6. Stand on a chair and reach for something 40%  7. Sweep the floor 90%  8. Walk outside the house to a car parked in the driveway 100%  9. Get into or out of a car 90%  10. Walk across a parking lot to the mall 100%  11. Walk up or down a ramp 100%  12. Walk in a crowded mall where people rapidly walk past you 90%  13. Are bumped into by people as you walk through the mall 80%  14. Step onto or off of an escalator while you are holding onto the railing 90%  15. Step onto or off an escalator while holding onto parcels such that you cannot hold onto the railing 70%  16. Walk outside on icy sidewalks 80%  Total: #/16 85%                                                                                                                                 TREATMENT DATE: 03/16/24     TA- To improve functional movements patterns for everyday tasks   -NuStep using both BUE/BLE reciprocal movements to promote strength, coordination, endurance, and cardiorespiratory fitness. Performed level 3-8 x 8 min -  Patient reported some fatigue yet not too bad   Side step ups 2 x 10 ea side  wirh step trainer plus 1 height addition   Gait with 4# resistance x 325 ft   Sts from elevated plinth 2 x 10 reps   Step tap ant with 4# AW 2 x 10 ea LE   Pt required occasional rest breaks due fatigue, PT was  attentive to when pt appeared to be tired or winded in order to prevent excessive fatigue.        PATIENT EDUCATION: Education details: POC. Pt educated throughout session about proper posture and technique with exercises. Improved exercise technique, movement at target joints, use of target muscles after min to mod verbal, visual, tactile cues.  Person educated: Patient Education method: Explanation Education comprehension: verbalized understanding   HOME EXERCISE PROGRAM: Access Code: [email or web address] URL: [email or web address] Date: 01/27/2024 Prepared by: Reyes London  Exercises - Seated Knee Flexion Stretch  - 1-2 x daily - 3 sets - 30 sec hold - Seated Hamstring Stretch  - 1-2 x daily - 3 sets - 30 hold - Standing Knee Flexion Stretch on Step  - 1-2 x daily - 3 sets - 30 sec hold - Standing Hamstring Stretch with Step  - 1-2 x daily - 3 sets - 30 hold    GOALS: Goals reviewed with patient? Yes  SHORT TERM GOALS: Target date: 02/23/2024      Patient will be independent in home exercise program to improve strength/mobility for better functional independence with ADLs. Baseline: No HEP currently; 02/29/2024= Patient reports compliant with HEP and now has a bar set up to hold onto with exercises. Will keep goal active to progress HEP Goal status: Progressing   LONG TERM GOALS: Target date: 04/19/2024    1.  Patient will complete five times sit to stand test in < 15 seconds indicating an increased LE strength and improved balance. Baseline: 32.05 sec heavy UE; 02/29/2024= 16.52 sec with heavy UE support Goal status: PROGRESSING  2.  Patient will improve ABC score to 90%   to demonstrate statistically significant improvement in mobility and  quality of life as it relates to their balance.  Baseline: 85%; 02/29/2024=86.9% Goal status:    3.  Patient will increase Berg Balance score by > 6 points to demonstrate decreased fall risk during functional activities. Baseline: test visit 2; 01/27/2024= 43/56; 02/29/2024= 45/56 Goal status: PROGRESSING   4.   Patient will reduce timed up and go to <11 seconds to reduce fall risk and demonstrate improved transfer/gait ability. Baseline: 17.26 sec; 02/29/2024=15.29 sec avg without AD Goal status: PROGRESSING  5.   Patient will increase 10 meter walk test to >1.24m/s as to improve gait speed for better community ambulation and to reduce fall risk. Baseline: .86 m/s; 02/29/2024= 0.97 m/s Goal status: PROGRESSING  6.  Patient will improve right knee range of motion to 110 degrees or greater in active flexion in order to allow him to improve right lower extremity ability to assist with various transitions including squats and sit to stands.  Baseline: 95 degrees active; 02/22/2024= 100 AROM, 104 terminal ROM.  Goal status: PROGRESSING    ASSESSMENT:  CLINICAL IMPRESSION:    Patient arrived with good motivation for today's session. Continued to progress his gluteal muscle activation with STS and step ups this date. Pt feeling and showing good progress at this time, still cannot produce power for STS from standard height chair. Pt did improve ability this date completing 20 reps from 21 in height at 20 reps. Pt will continue to benefit from skilled physical therapy intervention to address impairments, improve QOL, and attain therapy goals.     OBJECTIVE IMPAIRMENTS: Abnormal gait, decreased activity tolerance, decreased balance, decreased endurance, decreased mobility, difficulty walking, and decreased strength.   ACTIVITY LIMITATIONS: standing, squatting, stairs, and transfers  PARTICIPATION LIMITATIONS: shopping and community activity  PERSONAL FACTORS: Behavior pattern  and 3+  comorbidities:  PMX: OA, Cancer, DM, HTN are also affecting patient's functional outcome.   REHAB POTENTIAL: Good  CLINICAL DECISION MAKING: Evolving/moderate complexity  EVALUATION COMPLEXITY: Moderate  PLAN:  PT FREQUENCY: 2x/week  PT DURATION: 12 weeks  PLANNED INTERVENTIONS: [id number]- Physical Performance Testing, 97110-Therapeutic exercises, 97530- Therapeutic activity, 97112- Neuromuscular re-education, 97535- Self Care, [id number]- Manual therapy, [phone]- Gait training, Balance training, Stair training, Joint mobilization, Joint manipulation, Cryotherapy, and Moist heat  PLAN FOR NEXT SESSION:   Continue with knee ROM on R General LE strength and balance training - both static and dynamic within limits of knee pain Tandem stance Dynamic gait with head turns/scanning of environment STS without UE support - gradually decreasing seat height from 21'' this date Stair navigation: progress to step through pattern as appropriate Update HEP to include strengthening exercises   Lonni KATHEE Gainer, PT 03/16/2024, 11:10 AM

## 2024-03-21 ENCOUNTER — Ambulatory Visit: Admitting: Physical Therapy

## 2024-03-21 NOTE — Therapy (Incomplete)
 OUTPATIENT PHYSICAL THERAPY NEURO TREATMENT      Patient Name: Glen Jenkins MRN: [id number] DOB:September 08, 1940, 83 y.o., male Today's Date: 03/21/2024   PCP:   Auston Reyes BIRCH, MD   REFERRING PROVIDER:   Auston Reyes BIRCH, MD    END OF SESSION: ***              Past Medical History:  Diagnosis Date   AAA (abdominal aortic aneurysm)    Anginal pain    Arthritis    lower back   Cancer (HCC)    Lung Cancer; Squamous Cell Carcinoma   Cardiomyopathy (HCC)    Coronary artery disease    CTS (carpal tunnel syndrome)    right - numbness thumb and 1st and 2nd fingers   Diabetes mellitus without complication (HCC)    Dysrhythmia    Atrial Fibrillation   GERD (gastroesophageal reflux disease)    Hypercholesterolemia    Hypertension    Nephropathy, diabetic (HCC)    Onychomycosis    Renal insufficiency    Sleep apnea    Varicose veins    Wears dentures    partial upper   Wears hearing aid in both ears    Past Surgical History:  Procedure Laterality Date   CATARACT EXTRACTION W/PHACO Left 06/27/2020   Procedure: CATARACT EXTRACTION PHACO AND INTRAOCULAR LENS PLACEMENT (IOC) LEFT DIABETIC 13.57 01:47.8 12.6%;  Surgeon: Mittie Gaskin, MD;  Location: [facility];  Service: Ophthalmology;  Laterality: Left;  Diabetic - oral meds   CATARACT EXTRACTION W/PHACO Right 07/11/2020   Procedure: CATARACT EXTRACTION PHACO AND INTRAOCULAR LENS PLACEMENT (IOC) RIGHT DIABETIC;  Surgeon: Mittie Gaskin, MD;  Location: [facility];  Service: Ophthalmology;  Laterality: Right;  9.28 1:15.4 12.3%   COLONOSCOPY WITH PROPOFOL  N/A 09/28/2015   Procedure: COLONOSCOPY WITH PROPOFOL ;  Surgeon: Lamar ONEIDA Holmes, MD;  Location: [facility];  Service: Endoscopy;  Laterality: N/A;   CORONARY ANGIOPLASTY     Stent placement x 3 (1999, [id number])   TONSILLECTOMY     Patient Active Problem List   Diagnosis Date Noted   Bilateral carpal tunnel syndrome  06/16/2023   Class 2 severe obesity due to excess calories with serious comorbidity and body mass index (BMI) of 37.0 to 37.9 in adult 03/12/2023   Pain due to onychomycosis of toenails of both feet 05/05/2022   Coronary atherosclerosis 02/13/2020   Hypercholesterolemia 02/13/2020   Localized edema 02/13/2020   Proteinuria 02/13/2020   Abdominal aortic aneurysm 02/13/2020   Angina pectoris 02/13/2020   Atrial fibrillation (HCC) 02/13/2020   Cardiomyopathy (HCC) 02/13/2020   Edema of lower extremity 08/08/2019   Essential hypertension 08/08/2019   Macular degeneration 10/20/2018   Bleeding hemorrhoids 02/09/2017   CKD (chronic kidney disease) stage 3, GFR 30-59 ml/min (HCC) 01/04/2016   Type 2 diabetes mellitus (HCC) 09/18/2014    ONSET DATE: 3-4 years progressive   REFERRING DIAG: R29.898 (ICD-10-CM) - Leg weakness, bilateral   THERAPY DIAG: *** No diagnosis found.  Rationale for Evaluation and Treatment: Rehabilitation  SUBJECTIVE:  SUBJECTIVE STATEMENT:  *** Patient reports doing okay with no new issues. States has been trying to exercise more. Says his wife thoroughly enjoyed Pt here and was better than any other PT experience she has had.   Pt accompanied by: self  PERTINENT HISTORY: PMX: OA, Cancer, DM, HTN  PAIN:  Are you having pain? No  PRECAUTIONS: None  RED FLAGS: None   WEIGHT BEARING RESTRICTIONS: No  FALLS: Has patient fallen in last 6 months? Yes. Number of falls 1  LIVING ENVIRONMENT: Lives with: lives with their spouse Lives in: House/apartment Stairs: Yes: External: 3 steps; can reach both Has following equipment at home: trecking pole   PLOF: Independent with household mobility with device and Independent with community mobility with device  PATIENT GOALS:  Improve balance and strength   OBJECTIVE:   Note: Objective measures were completed at Evaluation unless otherwise noted.  DIAGNOSTIC FINDINGS: n/a  COGNITION: Overall cognitive status: Within functional limits for tasks assessed   SENSATION: Not tested    EDEMA:  Grade 1-2 Pitting edema noted on B Les, worse on R > L      POSTURE: rounded shoulders and forward head  LOWER EXTREMITY ROM:     Active  Right Eval Left Eval RIGHT  LE  02/22/2024  Hip flexion     Hip extension     Hip abduction     Hip adduction     Hip internal rotation     Hip external rotation     Knee flexion 95 degrees  100 degrees AROM & 104 degrees AROM when trying to reach terminal ROM  Knee extension     Ankle dorsiflexion     Ankle plantarflexion     Ankle inversion     Ankle eversion      (Blank rows = not tested)  LOWER EXTREMITY MMT:    MMT Right Eval Left Eval  Hip flexion 4 4  Hip extension    Hip abduction 4+ 4+  Hip adduction 4+ 4+  Hip internal rotation    Hip external rotation    Knee flexion 4 4  Knee extension 4 4  Ankle dorsiflexion 4+ 4+  Ankle plantarflexion    Ankle inversion    Ankle eversion    (Blank rows = not tested)  BED MOBILITY:  No reported difficulty  TRANSFERS: Sit to stand: Modified independence  Assistive device utilized: UE support      Stand to sit: Modified independence  Assistive device utilized: heavy UE      Chair to chair: Modified independence  Assistive device utilized: heavy UE        STAIRS: Not tested GAIT: Findings: Gait Characteristics: poor foot clearance- Right and poor foot clearance- Left, Distance walked: 30 ft, and Comments:    FUNCTIONAL TESTS:  5 times sit to stand: 32.05 sec heavy UE Timed up and go (TUG): 17.26 sec no AD : 11.36 sec ( .88 m/s)  BERG: test visit 2   PATIENT SURVEYS:  ABC scale: The Activities-Specific Balance Confidence (ABC) Scale 0% 10 20 30  40 50 60 70 80 90 100% No  confidence<->completely confident  "How confident are you that you will not lose your balance or become unsteady when you . . .   Date tested 01/26/24  Walk around the house 100%  2. Walk up or down stairs 70%  3. Bend over and pick up a slipper from in front of a closet floor 80%  4. Reach for a small  can off a shelf at eye level 100%  5. Stand on tip toes and reach for something above your head 80%  6. Stand on a chair and reach for something 40%  7. Sweep the floor 90%  8. Walk outside the house to a car parked in the driveway 100%  9. Get into or out of a car 90%  10. Walk across a parking lot to the mall 100%  11. Walk up or down a ramp 100%  12. Walk in a crowded mall where people rapidly walk past you 90%  13. Are bumped into by people as you walk through the mall 80%  14. Step onto or off of an escalator while you are holding onto the railing 90%  15. Step onto or off an escalator while holding onto parcels such that you cannot hold onto the railing 70%  16. Walk outside on icy sidewalks 80%  Total: #/16 85%                                                                                                                                 TREATMENT DATE: 03/21/24   ***  TA- To improve functional movements patterns for everyday tasks   -NuStep using both BUE/BLE reciprocal movements to promote strength, coordination, endurance, and cardiorespiratory fitness. Performed level 3-8 x 8 min -  Patient reported some fatigue yet not too bad   Side step ups 2 x 10 ea side wirh step trainer plus 1 height addition   Gait with 4# resistance x 325 ft   Sts from elevated plinth 2 x 10 reps   Step tap ant with 4# AW 2 x 10 ea LE   Pt required occasional rest breaks due fatigue, PT was attentive to when pt appeared to be tired or winded in order to prevent excessive fatigue.        PATIENT EDUCATION: Education details: POC. Pt educated throughout session about proper posture and  technique with exercises. Improved exercise technique, movement at target joints, use of target muscles after min to mod verbal, visual, tactile cues.  Person educated: Patient Education method: Explanation Education comprehension: verbalized understanding   HOME EXERCISE PROGRAM: Access Code: [email or web address] URL: [email or web address] Date: 01/27/2024 Prepared by: Reyes London  Exercises - Seated Knee Flexion Stretch  - 1-2 x daily - 3 sets - 30 sec hold - Seated Hamstring Stretch  - 1-2 x daily - 3 sets - 30 hold - Standing Knee Flexion Stretch on Step  - 1-2 x daily - 3 sets - 30 sec hold - Standing Hamstring Stretch with Step  - 1-2 x daily - 3 sets - 30 hold    GOALS: Goals reviewed with patient? Yes  SHORT TERM GOALS: Target date: 02/23/2024      Patient will be independent in home exercise program to improve strength/mobility for better functional independence with ADLs. Baseline: No HEP currently; 02/29/2024= Patient reports compliant with  HEP and now has a bar set up to hold onto with exercises. Will keep goal active to progress HEP Goal status: Progressing   LONG TERM GOALS: Target date: 04/19/2024    1.  Patient will complete five times sit to stand test in < 15 seconds indicating an increased LE strength and improved balance. Baseline: 32.05 sec heavy UE; 02/29/2024= 16.52 sec with heavy UE support Goal status: PROGRESSING  2.  Patient will improve ABC score to 90%   to demonstrate statistically significant improvement in mobility and quality of life as it relates to their balance.  Baseline: 85%; 02/29/2024=86.9% Goal status:    3.  Patient will increase Berg Balance score by > 6 points to demonstrate decreased fall risk during functional activities. Baseline: test visit 2; 01/27/2024= 43/56; 02/29/2024= 45/56 Goal status: PROGRESSING   4.   Patient will reduce timed up and go to <11 seconds to reduce fall risk and demonstrate improved  transfer/gait ability. Baseline: 17.26 sec; 02/29/2024=15.29 sec avg without AD Goal status: PROGRESSING  5.   Patient will increase 10 meter walk test to >1.58m/s as to improve gait speed for better community ambulation and to reduce fall risk. Baseline: .86 m/s; 02/29/2024= 0.97 m/s Goal status: PROGRESSING  6.  Patient will improve right knee range of motion to 110 degrees or greater in active flexion in order to allow him to improve right lower extremity ability to assist with various transitions including squats and sit to stands.  Baseline: 95 degrees active; 02/22/2024= 100 AROM, 104 terminal ROM.  Goal status: PROGRESSING    ASSESSMENT:  CLINICAL IMPRESSION:  ***  Patient arrived with good motivation for today's session. Continued to progress his gluteal muscle activation with STS and step ups this date. Pt feeling and showing good progress at this time, still cannot produce power for STS from standard height chair. Pt did improve ability this date completing 20 reps from 21 in height at 20 reps. Pt will continue to benefit from skilled physical therapy intervention to address impairments, improve QOL, and attain therapy goals.     OBJECTIVE IMPAIRMENTS: Abnormal gait, decreased activity tolerance, decreased balance, decreased endurance, decreased mobility, difficulty walking, and decreased strength.   ACTIVITY LIMITATIONS: standing, squatting, stairs, and transfers  PARTICIPATION LIMITATIONS: shopping and community activity  PERSONAL FACTORS: Behavior pattern and 3+ comorbidities:  PMX: OA, Cancer, DM, HTN are also affecting patient's functional outcome.   REHAB POTENTIAL: Good  CLINICAL DECISION MAKING: Evolving/moderate complexity  EVALUATION COMPLEXITY: Moderate  PLAN:  PT FREQUENCY: 2x/week  PT DURATION: 12 weeks  PLANNED INTERVENTIONS: 97750- Physical Performance Testing, 97110-Therapeutic exercises, 97530- Therapeutic activity, 97112- Neuromuscular re-education,  97535- Self Care, [id number]- Manual therapy, [phone]- Gait training, Balance training, Stair training, Joint mobilization, Joint manipulation, Cryotherapy, and Moist heat  PLAN FOR NEXT SESSION: ***  Continue with knee ROM on R General LE strength and balance training - both static and dynamic within limits of knee pain Tandem stance Dynamic gait with head turns/scanning of environment STS without UE support - gradually decreasing seat height from 21'' this date Stair navigation: progress to step through pattern as appropriate Update HEP to include strengthening exercises   Chiquita Silvan, Student-PT 03/21/2024, 7:58 AM

## 2024-03-23 ENCOUNTER — Ambulatory Visit: Admitting: Physical Therapy

## 2024-03-23 DIAGNOSIS — R2689 Other abnormalities of gait and mobility: Secondary | ICD-10-CM

## 2024-03-23 DIAGNOSIS — R262 Difficulty in walking, not elsewhere classified: Secondary | ICD-10-CM | POA: Diagnosis not present

## 2024-03-23 DIAGNOSIS — M6281 Muscle weakness (generalized): Secondary | ICD-10-CM

## 2024-03-23 DIAGNOSIS — R2681 Unsteadiness on feet: Secondary | ICD-10-CM

## 2024-03-23 DIAGNOSIS — R269 Unspecified abnormalities of gait and mobility: Secondary | ICD-10-CM

## 2024-03-23 NOTE — Therapy (Signed)
 OUTPATIENT PHYSICAL THERAPY NEURO TREATMENT      Patient Name: Glen Jenkins MRN: [id number] DOB:1941-03-20, 83 y.o., male Today's Date: 03/23/2024   PCP:   Auston Reyes BIRCH, MD   REFERRING PROVIDER:   Auston Reyes BIRCH, MD    END OF SESSION:   PT End of Session - 03/23/24 1102     Visit Number 16    Number of Visits 24    Date for Recertification  04/19/24    Progress Note Due on Visit 20    PT Start Time 1103    PT Stop Time 1142    PT Time Calculation (min) 39 min    Equipment Utilized During Treatment Gait belt    Activity Tolerance Patient tolerated treatment well;No increased pain    Behavior During Therapy WFL for tasks assessed/performed                    Past Medical History:  Diagnosis Date   AAA (abdominal aortic aneurysm)    Anginal pain    Arthritis    lower back   Cancer (HCC)    Lung Cancer; Squamous Cell Carcinoma   Cardiomyopathy (HCC)    Coronary artery disease    CTS (carpal tunnel syndrome)    right - numbness thumb and 1st and 2nd fingers   Diabetes mellitus without complication (HCC)    Dysrhythmia    Atrial Fibrillation   GERD (gastroesophageal reflux disease)    Hypercholesterolemia    Hypertension    Nephropathy, diabetic (HCC)    Onychomycosis    Renal insufficiency    Sleep apnea    Varicose veins    Wears dentures    partial upper   Wears hearing aid in both ears    Past Surgical History:  Procedure Laterality Date   CATARACT EXTRACTION W/PHACO Left 06/27/2020   Procedure: CATARACT EXTRACTION PHACO AND INTRAOCULAR LENS PLACEMENT (IOC) LEFT DIABETIC 13.57 01:47.8 12.6%;  Surgeon: Mittie Gaskin, MD;  Location: [facility];  Service: Ophthalmology;  Laterality: Left;  Diabetic - oral meds   CATARACT EXTRACTION W/PHACO Right 07/11/2020   Procedure: CATARACT EXTRACTION PHACO AND INTRAOCULAR LENS PLACEMENT (IOC) RIGHT DIABETIC;  Surgeon: Mittie Gaskin, MD;  Location: [facility];   Service: Ophthalmology;  Laterality: Right;  9.28 1:15.4 12.3%   COLONOSCOPY WITH PROPOFOL  N/A 09/28/2015   Procedure: COLONOSCOPY WITH PROPOFOL ;  Surgeon: Lamar ONEIDA Holmes, MD;  Location: [facility];  Service: Endoscopy;  Laterality: N/A;   CORONARY ANGIOPLASTY     Stent placement x 3 (1999, [id number])   TONSILLECTOMY     Patient Active Problem List   Diagnosis Date Noted   Bilateral carpal tunnel syndrome 06/16/2023   Class 2 severe obesity due to excess calories with serious comorbidity and body mass index (BMI) of 37.0 to 37.9 in adult 03/12/2023   Pain due to onychomycosis of toenails of both feet 05/05/2022   Coronary atherosclerosis 02/13/2020   Hypercholesterolemia 02/13/2020   Localized edema 02/13/2020   Proteinuria 02/13/2020   Abdominal aortic aneurysm 02/13/2020   Angina pectoris 02/13/2020   Atrial fibrillation (HCC) 02/13/2020   Cardiomyopathy (HCC) 02/13/2020   Edema of lower extremity 08/08/2019   Essential hypertension 08/08/2019   Macular degeneration 10/20/2018   Bleeding hemorrhoids 02/09/2017   CKD (chronic kidney disease) stage 3, GFR 30-59 ml/min (HCC) 01/04/2016   Type 2 diabetes mellitus (HCC) 09/18/2014    ONSET DATE: 3-4 years progressive   REFERRING DIAG: R29.898 (ICD-10-CM) - Leg weakness, bilateral  THERAPY DIAG:  No diagnosis found.  Rationale for Evaluation and Treatment: Rehabilitation  SUBJECTIVE:                                                                                                                                                                                             SUBJECTIVE STATEMENT:   Pt reports a fall out of bed this morning. Has scratch on his head and some bumps but no other injuries. Pt was excited that he was able to get up independently   Pt accompanied by: self  PERTINENT HISTORY: PMX: OA, Cancer, DM, HTN  PAIN:  Are you having pain? No  PRECAUTIONS: None  RED FLAGS: None   WEIGHT BEARING  RESTRICTIONS: No  FALLS: Has patient fallen in last 6 months? Yes. Number of falls 1  LIVING ENVIRONMENT: Lives with: lives with their spouse Lives in: House/apartment Stairs: Yes: External: 3 steps; can reach both Has following equipment at home: trecking pole   PLOF: Independent with household mobility with device and Independent with community mobility with device  PATIENT GOALS: Improve balance and strength   OBJECTIVE:   Note: Objective measures were completed at Evaluation unless otherwise noted.  DIAGNOSTIC FINDINGS: n/a  COGNITION: Overall cognitive status: Within functional limits for tasks assessed   SENSATION: Not tested    EDEMA:  Grade 1-2 Pitting edema noted on B Les, worse on R > L      POSTURE: rounded shoulders and forward head  LOWER EXTREMITY ROM:     Active  Right Eval Left Eval RIGHT  LE  02/22/2024  Hip flexion     Hip extension     Hip abduction     Hip adduction     Hip internal rotation     Hip external rotation     Knee flexion 95 degrees  100 degrees AROM & 104 degrees AROM when trying to reach terminal ROM  Knee extension     Ankle dorsiflexion     Ankle plantarflexion     Ankle inversion     Ankle eversion      (Blank rows = not tested)  LOWER EXTREMITY MMT:    MMT Right Eval Left Eval  Hip flexion 4 4  Hip extension    Hip abduction 4+ 4+  Hip adduction 4+ 4+  Hip internal rotation    Hip external rotation    Knee flexion 4 4  Knee extension 4 4  Ankle dorsiflexion 4+ 4+  Ankle plantarflexion    Ankle inversion    Ankle eversion    (Blank rows = not tested)  BED MOBILITY:  No reported difficulty  TRANSFERS: Sit to stand: Modified independence  Assistive device utilized: UE support      Stand to sit: Modified independence  Assistive device utilized: heavy UE      Chair to chair: Modified independence  Assistive device utilized: heavy UE        STAIRS: Not tested GAIT: Findings: Gait Characteristics:  poor foot clearance- Right and poor foot clearance- Left, Distance walked: 30 ft, and Comments:    FUNCTIONAL TESTS:  5 times sit to stand: 32.05 sec heavy UE Timed up and go (TUG): 17.26 sec no AD : 11.36 sec ( .88 m/s)  BERG: test visit 2   PATIENT SURVEYS:  ABC scale: The Activities-Specific Balance Confidence (ABC) Scale 0% 10 20 30  40 50 60 70 80 90 100% No confidence<->completely confident  "How confident are you that you will not lose your balance or become unsteady when you . . .   Date tested 01/26/24  Walk around the house 100%  2. Walk up or down stairs 70%  3. Bend over and pick up a slipper from in front of a closet floor 80%  4. Reach for a small can off a shelf at eye level 100%  5. Stand on tip toes and reach for something above your head 80%  6. Stand on a chair and reach for something 40%  7. Sweep the floor 90%  8. Walk outside the house to a car parked in the driveway 100%  9. Get into or out of a car 90%  10. Walk across a parking lot to the mall 100%  11. Walk up or down a ramp 100%  12. Walk in a crowded mall where people rapidly walk past you 90%  13. Are bumped into by people as you walk through the mall 80%  14. Step onto or off of an escalator while you are holding onto the railing 90%  15. Step onto or off an escalator while holding onto parcels such that you cannot hold onto the railing 70%  16. Walk outside on icy sidewalks 80%  Total: #/16 85%                                                                                                                                 TREATMENT DATE: 03/23/24     TA- To improve functional movements patterns for everyday tasks    -NuStep using both BUE/BLE reciprocal movements to promote strength, coordination, endurance, and cardiorespiratory fitness. Performed level 3-8 x 8 min -  Patient reported some fatigue yet not too bad   HEP update: - Mini Squat with Counter Support  - 1 x daily - 7 x weekly - 3  sets - 10 reps - Sidestepping near counter   - 1 x daily - 7 x weekly - 2 sets - 10 reps - Marching Near Counter  - 1 x daily - 7 x weekly -  3 sets - 10 reps - Mini Lunge with Counter Support   - 1 x daily - 7 x weekly - 1 sets - 10 reps  Sts from elevated plinth  x 10 reps   Gait with 3# AW x 470 ft   Pt required occasional rest breaks due fatigue, PT was attentive to when pt appeared to be tired or winded in order to prevent excessive fatigue.        PATIENT EDUCATION: Education details: POC. Pt educated throughout session about proper posture and technique with exercises. Improved exercise technique, movement at target joints, use of target muscles after min to mod verbal, visual, tactile cues.  Person educated: Patient Education method: Explanation Education comprehension: verbalized understanding   HOME EXERCISE PROGRAM: Access Code: [email or web address] URL: [email or web address] Date: 03/23/2024 Prepared by: Lonni Gainer  Exercises - Seated Knee Flexion Stretch  - 1-2 x daily - 3 sets - 30 sec hold - Seated Hamstring Stretch  - 1-2 x daily - 3 sets - 30 hold - Standing Knee Flexion Stretch on Step  - 1-2 x daily - 3 sets - 30 sec hold - Mini Squat with Counter Support  - 1 x daily - 7 x weekly - 3 sets - 10 reps - Sidestepping near counter   - 1 x daily - 7 x weekly - 2 sets - 10 reps - Marching Near Counter  - 1 x daily - 7 x weekly - 3 sets - 10 reps - Mini Lunge with Counter Support   - 1 x daily - 7 x weekly - 1 sets - 10 reps   GOALS: Goals reviewed with patient? Yes  SHORT TERM GOALS: Target date: 02/23/2024      Patient will be independent in home exercise program to improve strength/mobility for better functional independence with ADLs. Baseline: No HEP currently; 02/29/2024= Patient reports compliant with HEP and now has a bar set up to hold onto with exercises. Will keep goal active to progress HEP Goal status: Progressing   LONG TERM GOALS:  Target date: 04/19/2024    1.  Patient will complete five times sit to stand test in < 15 seconds indicating an increased LE strength and improved balance. Baseline: 32.05 sec heavy UE; 02/29/2024= 16.52 sec with heavy UE support Goal status: PROGRESSING  2.  Patient will improve ABC score to 90%   to demonstrate statistically significant improvement in mobility and quality of life as it relates to their balance.  Baseline: 85%; 02/29/2024=86.9% Goal status:    3.  Patient will increase Berg Balance score by > 6 points to demonstrate decreased fall risk during functional activities. Baseline: test visit 2; 01/27/2024= 43/56; 02/29/2024= 45/56 Goal status: PROGRESSING   4.   Patient will reduce timed up and go to <11 seconds to reduce fall risk and demonstrate improved transfer/gait ability. Baseline: 17.26 sec; 02/29/2024=15.29 sec avg without AD Goal status: PROGRESSING  5.   Patient will increase 10 meter walk test to >1.86m/s as to improve gait speed for better community ambulation and to reduce fall risk. Baseline: .86 m/s; 02/29/2024= 0.97 m/s Goal status: PROGRESSING  6.  Patient will improve right knee range of motion to 110 degrees or greater in active flexion in order to allow him to improve right lower extremity ability to assist with various transitions including squats and sit to stands.  Baseline: 95 degrees active; 02/22/2024= 100 AROM, 104 terminal ROM.  Goal status: PROGRESSING    ASSESSMENT:  CLINICAL IMPRESSION:  Patient arrived with good motivation for today's session. Continued to progress his gluteal muscle activation with STS and step ups this date. Pt feeling and showing good progress at this time, still cannot produce power for STS from standard height chair. Pt did improve ability this date completing 20 reps from 21 in height at 20 reps. Pt will continue to benefit from skilled physical therapy intervention to address impairments, improve QOL, and attain  therapy goals.     OBJECTIVE IMPAIRMENTS: Abnormal gait, decreased activity tolerance, decreased balance, decreased endurance, decreased mobility, difficulty walking, and decreased strength.   ACTIVITY LIMITATIONS: standing, squatting, stairs, and transfers  PARTICIPATION LIMITATIONS: shopping and community activity  PERSONAL FACTORS: Behavior pattern and 3+ comorbidities:  PMX: OA, Cancer, DM, HTN are also affecting patient's functional outcome.   REHAB POTENTIAL: Good  CLINICAL DECISION MAKING: Evolving/moderate complexity  EVALUATION COMPLEXITY: Moderate  PLAN:  PT FREQUENCY: 2x/week  PT DURATION: 12 weeks  PLANNED INTERVENTIONS: 97750- Physical Performance Testing, 97110-Therapeutic exercises, 97530- Therapeutic activity, 97112- Neuromuscular re-education, 97535- Self Care, [id number]- Manual therapy, [phone]- Gait training, Balance training, Stair training, Joint mobilization, Joint manipulation, Cryotherapy, and Moist heat  PLAN FOR NEXT SESSION:   Continue with knee ROM on R General LE strength and balance training - both static and dynamic within limits of knee pain Tandem stance Dynamic gait with head turns/scanning of environment STS without UE support - gradually decreasing seat height from 21'' this date Stair navigation: progress to step through pattern as appropriate    Lonni KATHEE Gainer, PT 03/23/2024, 1:32 PM

## 2024-03-28 ENCOUNTER — Ambulatory Visit: Admitting: Physical Therapy

## 2024-03-28 DIAGNOSIS — M6281 Muscle weakness (generalized): Secondary | ICD-10-CM

## 2024-03-28 DIAGNOSIS — R262 Difficulty in walking, not elsewhere classified: Secondary | ICD-10-CM

## 2024-03-28 DIAGNOSIS — R269 Unspecified abnormalities of gait and mobility: Secondary | ICD-10-CM

## 2024-03-28 DIAGNOSIS — R2689 Other abnormalities of gait and mobility: Secondary | ICD-10-CM

## 2024-03-28 DIAGNOSIS — R2681 Unsteadiness on feet: Secondary | ICD-10-CM

## 2024-03-28 NOTE — Therapy (Signed)
 OUTPATIENT PHYSICAL THERAPY NEURO TREATMENT      Patient Name: Glen Jenkins MRN: [id number] DOB:[birth year], 83 y.o., male Today's Date: 03/28/2024   PCP:   Auston Reyes BIRCH, MD   REFERRING PROVIDER:   Auston Reyes BIRCH, MD    END OF SESSION:   PT End of Session - 03/28/24 0936     Visit Number 17    Number of Visits 24    Date for Recertification  04/19/24    Progress Note Due on Visit 20    PT Start Time 0936    PT Stop Time 1015    PT Time Calculation (min) 39 min    Equipment Utilized During Treatment Gait belt    Activity Tolerance Patient tolerated treatment well;No increased pain    Behavior During Therapy WFL for tasks assessed/performed                     Past Medical History:  Diagnosis Date   AAA (abdominal aortic aneurysm)    Anginal pain    Arthritis    lower back   Cancer (HCC)    Lung Cancer; Squamous Cell Carcinoma   Cardiomyopathy (HCC)    Coronary artery disease    CTS (carpal tunnel syndrome)    right - numbness thumb and 1st and 2nd fingers   Diabetes mellitus without complication (HCC)    Dysrhythmia    Atrial Fibrillation   GERD (gastroesophageal reflux disease)    Hypercholesterolemia    Hypertension    Nephropathy, diabetic (HCC)    Onychomycosis    Renal insufficiency    Sleep apnea    Varicose veins    Wears dentures    partial upper   Wears hearing aid in both ears    Past Surgical History:  Procedure Laterality Date   CATARACT EXTRACTION W/PHACO Left 06/27/2020   Procedure: CATARACT EXTRACTION PHACO AND INTRAOCULAR LENS PLACEMENT (IOC) LEFT DIABETIC 13.57 01:47.8 12.6%;  Surgeon: Mittie Gaskin, MD;  Location: [facility];  Service: Ophthalmology;  Laterality: Left;  Diabetic - oral meds   CATARACT EXTRACTION W/PHACO Right 07/11/2020   Procedure: CATARACT EXTRACTION PHACO AND INTRAOCULAR LENS PLACEMENT (IOC) RIGHT DIABETIC;  Surgeon: Mittie Gaskin, MD;  Location: [facility];   Service: Ophthalmology;  Laterality: Right;  9.28 1:15.4 12.3%   COLONOSCOPY WITH PROPOFOL  N/A 09/28/2015   Procedure: COLONOSCOPY WITH PROPOFOL ;  Surgeon: Lamar ONEIDA Holmes, MD;  Location: [facility];  Service: Endoscopy;  Laterality: N/A;   CORONARY ANGIOPLASTY     Stent placement x 3 (1999, [id number])   TONSILLECTOMY     Patient Active Problem List   Diagnosis Date Noted   Bilateral carpal tunnel syndrome 06/16/2023   Class 2 severe obesity due to excess calories with serious comorbidity and body mass index (BMI) of 37.0 to 37.9 in adult 03/12/2023   Pain due to onychomycosis of toenails of both feet 05/05/2022   Coronary atherosclerosis 02/13/2020   Hypercholesterolemia 02/13/2020   Localized edema 02/13/2020   Proteinuria 02/13/2020   Abdominal aortic aneurysm 02/13/2020   Angina pectoris 02/13/2020   Atrial fibrillation (HCC) 02/13/2020   Cardiomyopathy (HCC) 02/13/2020   Edema of lower extremity 08/08/2019   Essential hypertension 08/08/2019   Macular degeneration 10/20/2018   Bleeding hemorrhoids 02/09/2017   CKD (chronic kidney disease) stage 3, GFR 30-59 ml/min (HCC) 01/04/2016   Type 2 diabetes mellitus (HCC) 09/18/2014    ONSET DATE: 3-4 years progressive   REFERRING DIAG: R29.898 (ICD-10-CM) - Leg weakness,  bilateral   THERAPY DIAG:  Difficulty in walking, not elsewhere classified  Abnormality of gait and mobility  Muscle weakness (generalized)  Other abnormalities of gait and mobility  Unsteadiness on feet  Rationale for Evaluation and Treatment: Rehabilitation  SUBJECTIVE:                                                                                                                                                                                             SUBJECTIVE STATEMENT:   Pt reports he is doing alright. Pt reports that he hasn't been sleeping well over the last couple days & missed church; so he hasn't trialed his new exercises that were  added to HEP. Pt stating that R leg has been sore over the last few days, not to the point where I couldn't walk.   Pt reporting A lot of stumbles ever since Thurs-Friday. Pt stating I don't feel quite as stable as I have been, especially with turning. Pt reporting feeling overall less confident, Like I was 6 weeks ago.   Some increased back pain that's been on & off, when standing.   Pt accompanied by: self  PERTINENT HISTORY: PMX: OA, Cancer, DM, HTN  PAIN:  Are you having pain? No  PRECAUTIONS: None  RED FLAGS: None   WEIGHT BEARING RESTRICTIONS: No  FALLS: Has patient fallen in last 6 months? Yes. Number of falls 1  LIVING ENVIRONMENT: Lives with: lives with their spouse Lives in: House/apartment Stairs: Yes: External: 3 steps; can reach both Has following equipment at home: trecking pole   PLOF: Independent with household mobility with device and Independent with community mobility with device  PATIENT GOALS: Improve balance and strength   OBJECTIVE:   Note: Objective measures were completed at Evaluation unless otherwise noted.  DIAGNOSTIC FINDINGS: n/a  COGNITION: Overall cognitive status: Within functional limits for tasks assessed   SENSATION: Not tested    EDEMA:  Grade 1-2 Pitting edema noted on B Les, worse on R > L      POSTURE: rounded shoulders and forward head  LOWER EXTREMITY ROM:     Active  Right Eval Left Eval RIGHT  LE  02/22/2024  Hip flexion     Hip extension     Hip abduction     Hip adduction     Hip internal rotation     Hip external rotation     Knee flexion 95 degrees  100 degrees AROM & 104 degrees AROM when trying to reach terminal ROM  Knee extension     Ankle dorsiflexion     Ankle plantarflexion     Ankle  inversion     Ankle eversion      (Blank rows = not tested)  LOWER EXTREMITY MMT:    MMT Right Eval Left Eval  Hip flexion 4 4  Hip extension    Hip abduction 4+ 4+  Hip adduction 4+ 4+  Hip  internal rotation    Hip external rotation    Knee flexion 4 4  Knee extension 4 4  Ankle dorsiflexion 4+ 4+  Ankle plantarflexion    Ankle inversion    Ankle eversion    (Blank rows = not tested)  BED MOBILITY:  No reported difficulty  TRANSFERS: Sit to stand: Modified independence  Assistive device utilized: UE support      Stand to sit: Modified independence  Assistive device utilized: heavy UE      Chair to chair: Modified independence  Assistive device utilized: heavy UE        STAIRS: Not tested GAIT: Findings: Gait Characteristics: poor foot clearance- Right and poor foot clearance- Left, Distance walked: 30 ft, and Comments:    FUNCTIONAL TESTS:  5 times sit to stand: 32.05 sec heavy UE Timed up and go (TUG): 17.26 sec no AD : 11.36 sec ( .88 m/s)  BERG: test visit 2   PATIENT SURVEYS:  ABC scale: The Activities-Specific Balance Confidence (ABC) Scale 0% 10 20 30  40 50 60 70 80 90 100% No confidence<->completely confident  "How confident are you that you will not lose your balance or become unsteady when you . . .   Date tested 01/26/24  Walk around the house 100%  2. Walk up or down stairs 70%  3. Bend over and pick up a slipper from in front of a closet floor 80%  4. Reach for a small can off a shelf at eye level 100%  5. Stand on tip toes and reach for something above your head 80%  6. Stand on a chair and reach for something 40%  7. Sweep the floor 90%  8. Walk outside the house to a car parked in the driveway 100%  9. Get into or out of a car 90%  10. Walk across a parking lot to the mall 100%  11. Walk up or down a ramp 100%  12. Walk in a crowded mall where people rapidly walk past you 90%  13. Are bumped into by people as you walk through the mall 80%  14. Step onto or off of an escalator while you are holding onto the railing 90%  15. Step onto or off an escalator while holding onto parcels such that you cannot hold onto the railing 70%  16.  Walk outside on icy sidewalks 80%  Total: #/16 85%                                                                                                                                 TREATMENT DATE: 03/28/24    TA-  To improve functional movements patterns for everyday tasks   -NuStep using both BUE/BLE reciprocal movements to promote strength, coordination, endurance, and cardiorespiratory fitness. Performed level 3-6 x 8 min -  Patient reported some fatigue, stating last minute was tiring  Reviewed HEP update:   - Mini Squat with Counter Support, 2x10 Pt reporting some R knee pain/soreness with increased depth. Verbal cueing for limited range based on pain.  - Sidestepping near counter  3x10 First set, with no resistance & pt reporting that activity felt easy. Added RTB for 2nd set, pt reporting feeling minimal resistance. Added GTB for final set; pt reporting feeling adequate resistance with activity.  - Marching Near Counter 2x10 Pt also reporting some R knee soreness with activity; continued to educate on completing limited range based on pain.  - Mini Lunge with Counter Support 1x10 each LE leading Pt reporting greater increase in pain with LLE leading; continued to educate on completing limited range based on pain.  Random turning in hallway d/t pt report of decreased confidence at home with activity Completed >250' gait without AD; pt with some mild instability during turning, R>L, with pt report of R knee feeling like it's going to give way.  CGA throughout; requiring extra time during turning, able to recover balance without minA from therapist.  Pt with some SOB from increased exertion, requiring seated rest break prior to leaving session.   Discussed POC with pt on benefit of continuation of therapy; educated on upcoming progress note at visit 20 to assess progress with therapy, with opportunity to further discuss POC based on findings.    Pt required occasional rest breaks  due fatigue, PT was attentive to when pt appeared to be tired or winded in order to prevent excessive fatigue.  Of note, pt reporting improvement in back pain with standing at conclusion of session compared to start of session.       PATIENT EDUCATION: Education details: POC. Pt educated throughout session about proper posture and technique with exercises. Improved exercise technique, movement at target joints, use of target muscles after min to mod verbal, visual, tactile cues.  Person educated: Patient Education method: Explanation Education comprehension: verbalized understanding   HOME EXERCISE PROGRAM: Access Code: [email or web address] URL: [email or web address] Date: 03/23/2024 Prepared by: Lonni Gainer  Exercises - Seated Knee Flexion Stretch  - 1-2 x daily - 3 sets - 30 sec hold - Seated Hamstring Stretch  - 1-2 x daily - 3 sets - 30 hold - Standing Knee Flexion Stretch on Step  - 1-2 x daily - 3 sets - 30 sec hold - Mini Squat with Counter Support  - 1 x daily - 7 x weekly - 3 sets - 10 reps - Sidestepping near counter   - 1 x daily - 7 x weekly - 2 sets - 10 reps - Marching Near Counter  - 1 x daily - 7 x weekly - 3 sets - 10 reps - Mini Lunge with Counter Support   - 1 x daily - 7 x weekly - 1 sets - 10 reps   GOALS: Goals reviewed with patient? Yes  SHORT TERM GOALS: Target date: 02/23/2024      Patient will be independent in home exercise program to improve strength/mobility for better functional independence with ADLs. Baseline: No HEP currently; 02/29/2024= Patient reports compliant with HEP and now has a bar set up to hold onto with exercises. Will keep goal active to progress HEP Goal status: Progressing   LONG TERM GOALS:  Target date: 04/19/2024    1.  Patient will complete five times sit to stand test in < 15 seconds indicating an increased LE strength and improved balance. Baseline: 32.05 sec heavy UE; 02/29/2024= 16.52 sec with heavy UE  support Goal status: PROGRESSING  2.  Patient will improve ABC score to 90%   to demonstrate statistically significant improvement in mobility and quality of life as it relates to their balance.  Baseline: 85%; 02/29/2024=86.9% Goal status:    3.  Patient will increase Berg Balance score by > 6 points to demonstrate decreased fall risk during functional activities. Baseline: test visit 2; 01/27/2024= 43/56; 02/29/2024= 45/56 Goal status: PROGRESSING   4.   Patient will reduce timed up and go to <11 seconds to reduce fall risk and demonstrate improved transfer/gait ability. Baseline: 17.26 sec; 02/29/2024=15.29 sec avg without AD Goal status: PROGRESSING  5.   Patient will increase 10 meter walk test to >1.27m/s as to improve gait speed for better community ambulation and to reduce fall risk. Baseline: .86 m/s; 02/29/2024= 0.97 m/s Goal status: PROGRESSING  6.  Patient will improve right knee range of motion to 110 degrees or greater in active flexion in order to allow him to improve right lower extremity ability to assist with various transitions including squats and sit to stands.  Baseline: 95 degrees active; 02/22/2024= 100 AROM, 104 terminal ROM.  Goal status: PROGRESSING    ASSESSMENT:  CLINICAL IMPRESSION:   Patient arrived with good motivation for today's session, despite pt report of recent decline in confidence. Pt reporting that he has not been sleeping well over last couple days, resulting in not completing additional HEP exercises over the weekend. D/t this, reviewed HEP exercises this date. Pt with some increased R knee soreness; educated to limit range of exercises based on pain. Completed gait with turns; pt with some report of some R knee instability during turns, R>L. Provided pt education regarding POC, including benefit of continued PT sessions. Pt will continue to benefit from skilled physical therapy intervention to address impairments, improve QOL, and attain therapy  goals.     OBJECTIVE IMPAIRMENTS: Abnormal gait, decreased activity tolerance, decreased balance, decreased endurance, decreased mobility, difficulty walking, and decreased strength.   ACTIVITY LIMITATIONS: standing, squatting, stairs, and transfers  PARTICIPATION LIMITATIONS: shopping and community activity  PERSONAL FACTORS: Behavior pattern and 3+ comorbidities:  PMX: OA, Cancer, DM, HTN are also affecting patient's functional outcome.   REHAB POTENTIAL: Good  CLINICAL DECISION MAKING: Evolving/moderate complexity  EVALUATION COMPLEXITY: Moderate  PLAN:  PT FREQUENCY: 2x/week  PT DURATION: 12 weeks  PLANNED INTERVENTIONS: 97750- Physical Performance Testing, 97110-Therapeutic exercises, 97530- Therapeutic activity, 97112- Neuromuscular re-education, 97535- Self Care, [id number]- Manual therapy, [phone]- Gait training, Balance training, Stair training, Joint mobilization, Joint manipulation, Cryotherapy, and Moist heat  PLAN FOR NEXT SESSION:   Continue with knee ROM, pain on R General LE strength and balance training - both static and dynamic within limits of knee pain Tandem stance Dynamic gait with head turns/scanning, turning of environment STS without UE support - gradually decreasing seat height from 21'' this date Stair navigation: progress to step through pattern as appropriate    Chiquita Silvan, Student-PT 03/28/2024, 12:26 PM

## 2024-03-30 ENCOUNTER — Ambulatory Visit: Admitting: Physical Therapy

## 2024-04-06 ENCOUNTER — Ambulatory Visit: Attending: Internal Medicine | Admitting: Physical Therapy

## 2024-04-06 DIAGNOSIS — R2689 Other abnormalities of gait and mobility: Secondary | ICD-10-CM | POA: Insufficient documentation

## 2024-04-06 DIAGNOSIS — R269 Unspecified abnormalities of gait and mobility: Secondary | ICD-10-CM | POA: Insufficient documentation

## 2024-04-06 DIAGNOSIS — M6281 Muscle weakness (generalized): Secondary | ICD-10-CM | POA: Diagnosis present

## 2024-04-06 DIAGNOSIS — R262 Difficulty in walking, not elsewhere classified: Secondary | ICD-10-CM | POA: Insufficient documentation

## 2024-04-06 DIAGNOSIS — R2681 Unsteadiness on feet: Secondary | ICD-10-CM | POA: Insufficient documentation

## 2024-04-06 NOTE — Therapy (Unsigned)
 OUTPATIENT PHYSICAL THERAPY NEURO TREATMENT      Patient Name: Glen Jenkins MRN: [id number] DOB:[birth year], 83 y.o., male Today's Date: 04/06/2024   PCP:   Auston Reyes BIRCH, MD   REFERRING PROVIDER:   Auston Reyes BIRCH, MD    END OF SESSION:   PT End of Session - 04/06/24 1326     Visit Number 18    Number of Visits 24    Date for Recertification  04/19/24    Progress Note Due on Visit 20    PT Start Time 1320    PT Stop Time 1400    PT Time Calculation (min) 40 min    Equipment Utilized During Treatment Gait belt    Activity Tolerance Patient tolerated treatment well;No increased pain    Behavior During Therapy WFL for tasks assessed/performed                     Past Medical History:  Diagnosis Date   AAA (abdominal aortic aneurysm)    Anginal pain    Arthritis    lower back   Cancer (HCC)    Lung Cancer; Squamous Cell Carcinoma   Cardiomyopathy (HCC)    Coronary artery disease    CTS (carpal tunnel syndrome)    right - numbness thumb and 1st and 2nd fingers   Diabetes mellitus without complication (HCC)    Dysrhythmia    Atrial Fibrillation   GERD (gastroesophageal reflux disease)    Hypercholesterolemia    Hypertension    Nephropathy, diabetic (HCC)    Onychomycosis    Renal insufficiency    Sleep apnea    Varicose veins    Wears dentures    partial upper   Wears hearing aid in both ears    Past Surgical History:  Procedure Laterality Date   CATARACT EXTRACTION W/PHACO Left 06/27/2020   Procedure: CATARACT EXTRACTION PHACO AND INTRAOCULAR LENS PLACEMENT (IOC) LEFT DIABETIC 13.57 01:47.8 12.6%;  Surgeon: Mittie Gaskin, MD;  Location: [facility];  Service: Ophthalmology;  Laterality: Left;  Diabetic - oral meds   CATARACT EXTRACTION W/PHACO Right 07/11/2020   Procedure: CATARACT EXTRACTION PHACO AND INTRAOCULAR LENS PLACEMENT (IOC) RIGHT DIABETIC;  Surgeon: Mittie Gaskin, MD;  Location: [facility];   Service: Ophthalmology;  Laterality: Right;  9.28 1:15.4 12.3%   COLONOSCOPY WITH PROPOFOL  N/A 09/28/2015   Procedure: COLONOSCOPY WITH PROPOFOL ;  Surgeon: Lamar ONEIDA Holmes, MD;  Location: [facility];  Service: Endoscopy;  Laterality: N/A;   CORONARY ANGIOPLASTY     Stent placement x 3 (1999, [id number])   TONSILLECTOMY     Patient Active Problem List   Diagnosis Date Noted   Bilateral carpal tunnel syndrome 06/16/2023   Class 2 severe obesity due to excess calories with serious comorbidity and body mass index (BMI) of 37.0 to 37.9 in adult 03/12/2023   Pain due to onychomycosis of toenails of both feet 05/05/2022   Coronary atherosclerosis 02/13/2020   Hypercholesterolemia 02/13/2020   Localized edema 02/13/2020   Proteinuria 02/13/2020   Abdominal aortic aneurysm 02/13/2020   Angina pectoris 02/13/2020   Atrial fibrillation (HCC) 02/13/2020   Cardiomyopathy (HCC) 02/13/2020   Edema of lower extremity 08/08/2019   Essential hypertension 08/08/2019   Macular degeneration 10/20/2018   Bleeding hemorrhoids 02/09/2017   CKD (chronic kidney disease) stage 3, GFR 30-59 ml/min (HCC) 01/04/2016   Type 2 diabetes mellitus (HCC) 09/18/2014    ONSET DATE: 3-4 years progressive   REFERRING DIAG: R29.898 (ICD-10-CM) - Leg weakness,  bilateral   THERAPY DIAG:  Difficulty in walking, not elsewhere classified  Abnormality of gait and mobility  Muscle weakness (generalized)  Other abnormalities of gait and mobility  Unsteadiness on feet  Rationale for Evaluation and Treatment: Rehabilitation  SUBJECTIVE:                                                                                                                                                                                             SUBJECTIVE STATEMENT:   Pt reports he is doing alright. Still reporting increased R lateral knee pain, but states that it is better today than the last 2 days. Also states some soreness from not  exercising for the last few days.   Pt accompanied by: self  PERTINENT HISTORY: PMX: OA, Cancer, DM, HTN  PAIN:  Are you having pain? No  PRECAUTIONS: None  RED FLAGS: None   WEIGHT BEARING RESTRICTIONS: No  FALLS: Has patient fallen in last 6 months? Yes. Number of falls 1  LIVING ENVIRONMENT: Lives with: lives with their spouse Lives in: House/apartment Stairs: Yes: External: 3 steps; can reach both Has following equipment at home: trecking pole   PLOF: Independent with household mobility with device and Independent with community mobility with device  PATIENT GOALS: Improve balance and strength   OBJECTIVE:   Note: Objective measures were completed at Evaluation unless otherwise noted.  DIAGNOSTIC FINDINGS: n/a  COGNITION: Overall cognitive status: Within functional limits for tasks assessed   SENSATION: Not tested    EDEMA:  Grade 1-2 Pitting edema noted on B Les, worse on R > L      POSTURE: rounded shoulders and forward head  LOWER EXTREMITY ROM:     Active  Right Eval Left Eval RIGHT  LE  02/22/2024  Hip flexion     Hip extension     Hip abduction     Hip adduction     Hip internal rotation     Hip external rotation     Knee flexion 95 degrees  100 degrees AROM & 104 degrees AROM when trying to reach terminal ROM  Knee extension     Ankle dorsiflexion     Ankle plantarflexion     Ankle inversion     Ankle eversion      (Blank rows = not tested)  LOWER EXTREMITY MMT:    MMT Right Eval Left Eval  Hip flexion 4 4  Hip extension    Hip abduction 4+ 4+  Hip adduction 4+ 4+  Hip internal rotation    Hip external rotation    Knee flexion 4 4  Knee extension  4 4  Ankle dorsiflexion 4+ 4+  Ankle plantarflexion    Ankle inversion    Ankle eversion    (Blank rows = not tested)  BED MOBILITY:  No reported difficulty  TRANSFERS: Sit to stand: Modified independence  Assistive device utilized: UE support      Stand to sit: Modified  independence  Assistive device utilized: heavy UE      Chair to chair: Modified independence  Assistive device utilized: heavy UE        STAIRS: Not tested GAIT: Findings: Gait Characteristics: poor foot clearance- Right and poor foot clearance- Left, Distance walked: 30 ft, and Comments:    FUNCTIONAL TESTS:  5 times sit to stand: 32.05 sec heavy UE Timed up and go (TUG): 17.26 sec no AD : 11.36 sec ( .88 m/s)  BERG: test visit 2   PATIENT SURVEYS:  ABC scale: The Activities-Specific Balance Confidence (ABC) Scale 0% 10 20 30  40 50 60 70 80 90 100% No confidence<->completely confident  "How confident are you that you will not lose your balance or become unsteady when you . . .   Date tested 01/26/24  Walk around the house 100%  2. Walk up or down stairs 70%  3. Bend over and pick up a slipper from in front of a closet floor 80%  4. Reach for a small can off a shelf at eye level 100%  5. Stand on tip toes and reach for something above your head 80%  6. Stand on a chair and reach for something 40%  7. Sweep the floor 90%  8. Walk outside the house to a car parked in the driveway 100%  9. Get into or out of a car 90%  10. Walk across a parking lot to the mall 100%  11. Walk up or down a ramp 100%  12. Walk in a crowded mall where people rapidly walk past you 90%  13. Are bumped into by people as you walk through the mall 80%  14. Step onto or off of an escalator while you are holding onto the railing 90%  15. Step onto or off an escalator while holding onto parcels such that you cannot hold onto the railing 70%  16. Walk outside on icy sidewalks 80%  Total: #/16 85%                                                                                                                                 TREATMENT DATE: 04/06/24    TA- To improve functional movements patterns for everyday tasks   -NuStep using both BUE/BLE reciprocal movements to promote strength, coordination,  endurance, and cardiorespiratory fitness. Double hill program. Performed level 3-6 x 8 min -  Patient reported some fatigue upon completion   Seated hip abduction GTB 2 x 15  Standing monster walk forward/reverse GTB 69ft x 5 for 2 bouts. Cues for increased step width in reverse.  Seated hip flexion GTB at midfoot 2 x 12 bil  Seated piriformis stretch pulling knee to chest x 45 sec bil  Standing partial squat x 12 with BUE on rail.   Start/stop with intermittent reverse gait x 242ft total . No LOB noted and improved consistency of step length in reverse.   For all standing interventions, PT provided CGA/close supervision assist for safety and min cues for proper step length   Pt required occasional rest breaks due fatigue, PT was attentive to when pt appeared to be tired or winded in order to prevent excessive fatigue.  Of note, pt reporting improvement in back pain with standing at conclusion of session compared to start of session.       PATIENT EDUCATION: Education details: POC. Pt educated throughout session about proper posture and technique with exercises. Improved exercise technique, movement at target joints, use of target muscles after min to mod verbal, visual, tactile cues.  Discussed POC with pt on benefit of continuation of therapy; educated on upcoming progress note at visit 20 to assess progress with therapy, with opportunity to further discuss POC based on findings.   Person educated: Patient Education method: Explanation Education comprehension: verbalized understanding   HOME EXERCISE PROGRAM: Access Code: [email or web address] URL: [email or web address] Date: 03/23/2024 Prepared by: Lonni Gainer  Exercises - Seated Knee Flexion Stretch  - 1-2 x daily - 3 sets - 30 sec hold - Seated Hamstring Stretch  - 1-2 x daily - 3 sets - 30 hold - Standing Knee Flexion Stretch on Step  - 1-2 x daily - 3 sets - 30 sec hold - Mini Squat with Counter Support  - 1 x  daily - 7 x weekly - 3 sets - 10 reps - Sidestepping near counter   - 1 x daily - 7 x weekly - 2 sets - 10 reps - Marching Near Counter  - 1 x daily - 7 x weekly - 3 sets - 10 reps - Mini Lunge with Counter Support   - 1 x daily - 7 x weekly - 1 sets - 10 reps   GOALS: Goals reviewed with patient? Yes  SHORT TERM GOALS: Target date: 02/23/2024      Patient will be independent in home exercise program to improve strength/mobility for better functional independence with ADLs. Baseline: No HEP currently; 02/29/2024= Patient reports compliant with HEP and now has a bar set up to hold onto with exercises. Will keep goal active to progress HEP Goal status: Progressing   LONG TERM GOALS: Target date: 04/19/2024    1.  Patient will complete five times sit to stand test in < 15 seconds indicating an increased LE strength and improved balance. Baseline: 32.05 sec heavy UE; 02/29/2024= 16.52 sec with heavy UE support Goal status: PROGRESSING  2.  Patient will improve ABC score to 90%   to demonstrate statistically significant improvement in mobility and quality of life as it relates to their balance.  Baseline: 85%; 02/29/2024=86.9% Goal status:    3.  Patient will increase Berg Balance score by > 6 points to demonstrate decreased fall risk during functional activities. Baseline: test visit 2; 01/27/2024= 43/56; 02/29/2024= 45/56 Goal status: PROGRESSING   4.   Patient will reduce timed up and go to <11 seconds to reduce fall risk and demonstrate improved transfer/gait ability. Baseline: 17.26 sec; 02/29/2024=15.29 sec avg without AD Goal status: PROGRESSING  5.   Patient will increase 10 meter walk test to >1.17m/s as to improve gait speed for  better community ambulation and to reduce fall risk. Baseline: .86 m/s; 02/29/2024= 0.97 m/s Goal status: PROGRESSING  6.  Patient will improve right knee range of motion to 110 degrees or greater in active flexion in order to allow him to improve  right lower extremity ability to assist with various transitions including squats and sit to stands.  Baseline: 95 degrees active; 02/22/2024= 100 AROM, 104 terminal ROM.  Goal status: PROGRESSING    ASSESSMENT:  CLINICAL IMPRESSION:   Patient arrived with good motivation for today's session, despite pt report of recent decline in confidence. Pt reports mild improvement in knee pain. Increased strengthening interventions performed on this day for hip complex. Was noted to have very little difficulty with stop/start and forward/reverse gait on this day with improved step length and symmetry compared to prior sessions. States that when he remembers to maintain erect posture that step length and foot clearance are easier. Pt will continue to benefit from skilled physical therapy intervention to address impairments, improve QOL, and attain therapy goals.     OBJECTIVE IMPAIRMENTS: Abnormal gait, decreased activity tolerance, decreased balance, decreased endurance, decreased mobility, difficulty walking, and decreased strength.   ACTIVITY LIMITATIONS: standing, squatting, stairs, and transfers  PARTICIPATION LIMITATIONS: shopping and community activity  PERSONAL FACTORS: Behavior pattern and 3+ comorbidities:  PMX: OA, Cancer, DM, HTN are also affecting patient's functional outcome.   REHAB POTENTIAL: Good  CLINICAL DECISION MAKING: Evolving/moderate complexity  EVALUATION COMPLEXITY: Moderate  PLAN:  PT FREQUENCY: 2x/week  PT DURATION: 12 weeks  PLANNED INTERVENTIONS: 97750- Physical Performance Testing, 97110-Therapeutic exercises, 97530- Therapeutic activity, 97112- Neuromuscular re-education, 97535- Self Care, [id number]- Manual therapy, [id number]- Gait training, Balance training, Stair training, Joint mobilization, Joint manipulation, Cryotherapy, and Moist heat  PLAN FOR NEXT SESSION:   Continue with knee ROM, pain on R General LE strength and balance training - both static and dynamic  within limits of knee pain Tandem stance Dynamic gait with head turns/scanning, turning of environment STS without UE support - gradually decreasing seat height from 21'' this date Stair navigation: progress to step through pattern as appropriate    Massie FORBES Dollar, PT 04/06/2024, 1:27 PM

## 2024-04-11 ENCOUNTER — Ambulatory Visit: Admitting: Physical Therapy

## 2024-04-13 ENCOUNTER — Encounter: Payer: Self-pay | Admitting: Physical Therapy

## 2024-04-13 ENCOUNTER — Ambulatory Visit: Admitting: Physical Therapy

## 2024-04-13 DIAGNOSIS — M6281 Muscle weakness (generalized): Secondary | ICD-10-CM

## 2024-04-13 DIAGNOSIS — R2689 Other abnormalities of gait and mobility: Secondary | ICD-10-CM

## 2024-04-13 DIAGNOSIS — R262 Difficulty in walking, not elsewhere classified: Secondary | ICD-10-CM | POA: Diagnosis not present

## 2024-04-13 DIAGNOSIS — R269 Unspecified abnormalities of gait and mobility: Secondary | ICD-10-CM

## 2024-04-13 DIAGNOSIS — R2681 Unsteadiness on feet: Secondary | ICD-10-CM

## 2024-04-13 NOTE — Therapy (Signed)
 OUTPATIENT PHYSICAL THERAPY NEURO TREATMENT      Patient Name: Glen Jenkins MRN: [id number] DOB:08-04-1940, 83 y.o., male Today's Date: 04/13/2024   PCP:   Auston Reyes BIRCH, MD   REFERRING PROVIDER:   Auston Reyes BIRCH, MD    END OF SESSION:   PT End of Session - 04/13/24 1334     Visit Number 19    Number of Visits 24    Date for Recertification  04/19/24    Progress Note Due on Visit 20    PT Start Time 1334    PT Stop Time 1400    PT Time Calculation (min) 26 min    Equipment Utilized During Treatment Gait belt    Activity Tolerance Patient tolerated treatment well;No increased pain    Behavior During Therapy WFL for tasks assessed/performed                     Past Medical History:  Diagnosis Date   AAA (abdominal aortic aneurysm)    Anginal pain    Arthritis    lower back   Cancer (HCC)    Lung Cancer; Squamous Cell Carcinoma   Cardiomyopathy (HCC)    Coronary artery disease    CTS (carpal tunnel syndrome)    right - numbness thumb and 1st and 2nd fingers   Diabetes mellitus without complication (HCC)    Dysrhythmia    Atrial Fibrillation   GERD (gastroesophageal reflux disease)    Hypercholesterolemia    Hypertension    Nephropathy, diabetic (HCC)    Onychomycosis    Renal insufficiency    Sleep apnea    Varicose veins    Wears dentures    partial upper   Wears hearing aid in both ears    Past Surgical History:  Procedure Laterality Date   CATARACT EXTRACTION W/PHACO Left 06/27/2020   Procedure: CATARACT EXTRACTION PHACO AND INTRAOCULAR LENS PLACEMENT (IOC) LEFT DIABETIC 13.57 01:47.8 12.6%;  Surgeon: Mittie Gaskin, MD;  Location: [facility];  Service: Ophthalmology;  Laterality: Left;  Diabetic - oral meds   CATARACT EXTRACTION W/PHACO Right 07/11/2020   Procedure: CATARACT EXTRACTION PHACO AND INTRAOCULAR LENS PLACEMENT (IOC) RIGHT DIABETIC;  Surgeon: Mittie Gaskin, MD;  Location: [facility];   Service: Ophthalmology;  Laterality: Right;  9.28 1:15.4 12.3%   COLONOSCOPY WITH PROPOFOL  N/A 09/28/2015   Procedure: COLONOSCOPY WITH PROPOFOL ;  Surgeon: Lamar ONEIDA Holmes, MD;  Location: [facility];  Service: Endoscopy;  Laterality: N/A;   CORONARY ANGIOPLASTY     Stent placement x 3 (1999, [id number])   TONSILLECTOMY     Patient Active Problem List   Diagnosis Date Noted   Bilateral carpal tunnel syndrome 06/16/2023   Class 2 severe obesity due to excess calories with serious comorbidity and body mass index (BMI) of 37.0 to 37.9 in adult 03/12/2023   Pain due to onychomycosis of toenails of both feet 05/05/2022   Coronary atherosclerosis 02/13/2020   Hypercholesterolemia 02/13/2020   Localized edema 02/13/2020   Proteinuria 02/13/2020   Abdominal aortic aneurysm 02/13/2020   Angina pectoris 02/13/2020   Atrial fibrillation (HCC) 02/13/2020   Cardiomyopathy (HCC) 02/13/2020   Edema of lower extremity 08/08/2019   Essential hypertension 08/08/2019   Macular degeneration 10/20/2018   Bleeding hemorrhoids 02/09/2017   CKD (chronic kidney disease) stage 3, GFR 30-59 ml/min (HCC) 01/04/2016   Type 2 diabetes mellitus (HCC) 09/18/2014    ONSET DATE: 3-4 years progressive   REFERRING DIAG: R29.898 (ICD-10-CM) - Leg weakness,  bilateral   THERAPY DIAG:  Difficulty in walking, not elsewhere classified  Abnormality of gait and mobility  Muscle weakness (generalized)  Other abnormalities of gait and mobility  Unsteadiness on feet  Rationale for Evaluation and Treatment: Rehabilitation  SUBJECTIVE:                                                                                                                                                                                             SUBJECTIVE STATEMENT:   Pt reports he is feeling tired today, reporting he was late to appt due to falling asleep putting his shoes on. He reports he felt ill recently and was unable to come to  therapy last appointment. He reports feeling better from the sickness.  Pt accompanied by: self  PERTINENT HISTORY: PMX: OA, Cancer, DM, HTN  PAIN:  Are you having pain? No just minor R knee pain  PRECAUTIONS: None  RED FLAGS: None   WEIGHT BEARING RESTRICTIONS: No  FALLS: Has patient fallen in last 6 months? Yes. Number of falls 1  LIVING ENVIRONMENT: Lives with: lives with their spouse Lives in: House/apartment Stairs: Yes: External: 3 steps; can reach both Has following equipment at home: trecking pole   PLOF: Independent with household mobility with device and Independent with community mobility with device  PATIENT GOALS: Improve balance and strength   OBJECTIVE:   Note: Objective measures were completed at Evaluation unless otherwise noted.  DIAGNOSTIC FINDINGS: n/a  COGNITION: Overall cognitive status: Within functional limits for tasks assessed   SENSATION: Not tested    EDEMA:  Grade 1-2 Pitting edema noted on B Les, worse on R > L      POSTURE: rounded shoulders and forward head  LOWER EXTREMITY ROM:     Active  Right Eval Left Eval RIGHT  LE  02/22/2024  Hip flexion     Hip extension     Hip abduction     Hip adduction     Hip internal rotation     Hip external rotation     Knee flexion 95 degrees  100 degrees AROM & 104 degrees AROM when trying to reach terminal ROM  Knee extension     Ankle dorsiflexion     Ankle plantarflexion     Ankle inversion     Ankle eversion      (Blank rows = not tested)  LOWER EXTREMITY MMT:    MMT Right Eval Left Eval  Hip flexion 4 4  Hip extension    Hip abduction 4+ 4+  Hip adduction 4+ 4+  Hip internal rotation    Hip external rotation  Knee flexion 4 4  Knee extension 4 4  Ankle dorsiflexion 4+ 4+  Ankle plantarflexion    Ankle inversion    Ankle eversion    (Blank rows = not tested)  BED MOBILITY:  No reported difficulty  TRANSFERS: Sit to stand: Modified independence   Assistive device utilized: UE support      Stand to sit: Modified independence  Assistive device utilized: heavy UE      Chair to chair: Modified independence  Assistive device utilized: heavy UE        STAIRS: Not tested GAIT: Findings: Gait Characteristics: poor foot clearance- Right and poor foot clearance- Left, Distance walked: 30 ft, and Comments:    FUNCTIONAL TESTS:  5 times sit to stand: 32.05 sec heavy UE Timed up and go (TUG): 17.26 sec no AD : 11.36 sec ( .88 m/s)  BERG: test visit 2   PATIENT SURVEYS:  ABC scale: The Activities-Specific Balance Confidence (ABC) Scale 0% 10 20 30  40 50 60 70 80 90 100% No confidence<->completely confident  "How confident are you that you will not lose your balance or become unsteady when you . . .   Date tested 01/26/24  Walk around the house 100%  2. Walk up or down stairs 70%  3. Bend over and pick up a slipper from in front of a closet floor 80%  4. Reach for a small can off a shelf at eye level 100%  5. Stand on tip toes and reach for something above your head 80%  6. Stand on a chair and reach for something 40%  7. Sweep the floor 90%  8. Walk outside the house to a car parked in the driveway 100%  9. Get into or out of a car 90%  10. Walk across a parking lot to the mall 100%  11. Walk up or down a ramp 100%  12. Walk in a crowded mall where people rapidly walk past you 90%  13. Are bumped into by people as you walk through the mall 80%  14. Step onto or off of an escalator while you are holding onto the railing 90%  15. Step onto or off an escalator while holding onto parcels such that you cannot hold onto the railing 70%  16. Walk outside on icy sidewalks 80%  Total: #/16 85%                                                                                                                                 TREATMENT DATE: 04/13/24    -NuStep double hill program, level 7-8, for 7 minutes, BUE/BLE reciprocal movement  pattern to promote strength, coordination, endurance, and cardiorespiratory fitness  -seated GTB hip flexion 2x8- cue for slowed movement and eccentric control of downward aspect  -seated sidestepping w/ GTB 2x10 each side  -seated hip abduction GTB 2x10, verbal cue for feet placement  -squats at bar 2x12, both hands  on handrail for support  -2 laps, 349ft, one clockwise one counterclockwise; CGA only, pt reported 7/10 BORG RPE scale  PT provided CGA/supervision assist for all standing activities. Pt continues to require rest breaks occasionally due to fatigue or SOB.   PATIENT EDUCATION: Education details: POC. Pt educated throughout session about proper posture and technique with exercises. Improved exercise technique, movement at target joints, use of target muscles after min to mod verbal, visual, tactile cues.  Discussed POC with pt on benefit of continuation of therapy; educated on upcoming progress note at visit 20 to assess progress with therapy, with opportunity to further discuss POC based on findings.   Person educated: Patient Education method: Explanation Education comprehension: verbalized understanding   HOME EXERCISE PROGRAM: Access Code: [email or web address] URL: [email or web address] Date: 03/23/2024 Prepared by: Lonni Gainer  Exercises - Seated Knee Flexion Stretch  - 1-2 x daily - 3 sets - 30 sec hold - Seated Hamstring Stretch  - 1-2 x daily - 3 sets - 30 hold - Standing Knee Flexion Stretch on Step  - 1-2 x daily - 3 sets - 30 sec hold - Mini Squat with Counter Support  - 1 x daily - 7 x weekly - 3 sets - 10 reps - Sidestepping near counter   - 1 x daily - 7 x weekly - 2 sets - 10 reps - Marching Near Counter  - 1 x daily - 7 x weekly - 3 sets - 10 reps - Mini Lunge with Counter Support   - 1 x daily - 7 x weekly - 1 sets - 10 reps   GOALS: Goals reviewed with patient? Yes  SHORT TERM GOALS: Target date: 02/23/2024      Patient will be  independent in home exercise program to improve strength/mobility for better functional independence with ADLs. Baseline: No HEP currently; 02/29/2024= Patient reports compliant with HEP and now has a bar set up to hold onto with exercises. Will keep goal active to progress HEP Goal status: Progressing   LONG TERM GOALS: Target date: 04/19/2024    1.  Patient will complete five times sit to stand test in < 15 seconds indicating an increased LE strength and improved balance. Baseline: 32.05 sec heavy UE; 02/29/2024= 16.52 sec with heavy UE support Goal status: PROGRESSING  2.  Patient will improve ABC score to 90%   to demonstrate statistically significant improvement in mobility and quality of life as it relates to their balance.  Baseline: 85%; 02/29/2024=86.9% Goal status:    3.  Patient will increase Berg Balance score by > 6 points to demonstrate decreased fall risk during functional activities. Baseline: test visit 2; 01/27/2024= 43/56; 02/29/2024= 45/56 Goal status: PROGRESSING   4.   Patient will reduce timed up and go to <11 seconds to reduce fall risk and demonstrate improved transfer/gait ability. Baseline: 17.26 sec; 02/29/2024=15.29 sec avg without AD Goal status: PROGRESSING  5.   Patient will increase 10 meter walk test to >1.63m/s as to improve gait speed for better community ambulation and to reduce fall risk. Baseline: .86 m/s; 02/29/2024= 0.97 m/s Goal status: PROGRESSING  6.  Patient will improve right knee range of motion to 110 degrees or greater in active flexion in order to allow him to improve right lower extremity ability to assist with various transitions including squats and sit to stands.  Baseline: 95 degrees active; 02/22/2024= 100 AROM, 104 terminal ROM.  Goal status: PROGRESSING    ASSESSMENT:  CLINICAL IMPRESSION:    Pt  arrived late to session today due to falling asleep putting his shoes on, but appeared motivated to be able to participate in what  time was left available for therapy. Began on Nustep with reciprocal motion to promote strength, cardiorespiratory endurance, and coordination, with pt reporting minimal fatigue following exercise. Continued in today's session working on increasing B hip strength, and pt required minor verbal and tactile cuing for performance and form of seated GTB activities. Pt educated on slowing down movements in order to obtain larger ROM and slowed eccentric aspect of movement, specifically during seated GTB hip flexion. Pt showed good stamina and depth during performance of squatting exercise. Pt reported 7/10 on Borg RPE scale following ambulation of 340ft, showing minimal SOB. Pt will continue to benefit from skilled physical therapy intervention to address impairments, improve QOL, and attain therapy goals.     OBJECTIVE IMPAIRMENTS: Abnormal gait, decreased activity tolerance, decreased balance, decreased endurance, decreased mobility, difficulty walking, and decreased strength.   ACTIVITY LIMITATIONS: standing, squatting, stairs, and transfers  PARTICIPATION LIMITATIONS: shopping and community activity  PERSONAL FACTORS: Behavior pattern and 3+ comorbidities:  PMX: OA, Cancer, DM, HTN are also affecting patient's functional outcome.   REHAB POTENTIAL: Good  CLINICAL DECISION MAKING: Evolving/moderate complexity  EVALUATION COMPLEXITY: Moderate  PLAN:  PT FREQUENCY: 2x/week  PT DURATION: 12 weeks  PLANNED INTERVENTIONS: 97750- Physical Performance Testing, 97110-Therapeutic exercises, 97530- Therapeutic activity, 97112- Neuromuscular re-education, 97535- Self Care, [id number]- Manual therapy, [phone]- Gait training, Balance training, Stair training, Joint mobilization, Joint manipulation, Cryotherapy, and Moist heat  PLAN FOR NEXT SESSION:   Continue with knee ROM, pain on R General LE strength and balance training - both static and dynamic within limits of knee pain Tandem stance Dynamic gait with  head turns/scanning, turning of environment STS without UE support - gradually decreasing seat height from 21'' this date Stair navigation: progress to step through pattern as appropriate   Adreyan Carbajal Rendall, Student-SLP 04/13/2024, 1:35 PM

## 2024-04-18 ENCOUNTER — Ambulatory Visit: Admitting: Physical Therapy

## 2024-04-18 ENCOUNTER — Encounter: Payer: Self-pay | Admitting: Physical Therapy

## 2024-04-18 DIAGNOSIS — R2689 Other abnormalities of gait and mobility: Secondary | ICD-10-CM

## 2024-04-18 DIAGNOSIS — R262 Difficulty in walking, not elsewhere classified: Secondary | ICD-10-CM

## 2024-04-18 DIAGNOSIS — R2681 Unsteadiness on feet: Secondary | ICD-10-CM

## 2024-04-18 DIAGNOSIS — R269 Unspecified abnormalities of gait and mobility: Secondary | ICD-10-CM

## 2024-04-18 DIAGNOSIS — M6281 Muscle weakness (generalized): Secondary | ICD-10-CM

## 2024-04-18 NOTE — Therapy (Signed)
 OUTPATIENT PHYSICAL THERAPY PROGRESS NOTE  Dates of reporting period  02/29/24   to   04/18/24      Patient Name: Glen Jenkins MRN: [id number] DOB:May 02, 1941, 83 y.o., male Today's Date: 04/18/2024   PCP:   Auston Reyes BIRCH, MD   REFERRING PROVIDER:   Auston Reyes BIRCH, MD    END OF SESSION:   PT End of Session - 04/18/24 1318     Visit Number 20    Number of Visits 24    Date for Recertification  04/19/24    PT Start Time 1320    PT Stop Time 1400    PT Time Calculation (min) 40 min    Equipment Utilized During Treatment Gait belt    Activity Tolerance Patient tolerated treatment well    Behavior During Therapy WFL for tasks assessed/performed                     Past Medical History:  Diagnosis Date   AAA (abdominal aortic aneurysm)    Anginal pain    Arthritis    lower back   Cancer (HCC)    Lung Cancer; Squamous Cell Carcinoma   Cardiomyopathy (HCC)    Coronary artery disease    CTS (carpal tunnel syndrome)    right - numbness thumb and 1st and 2nd fingers   Diabetes mellitus without complication (HCC)    Dysrhythmia    Atrial Fibrillation   GERD (gastroesophageal reflux disease)    Hypercholesterolemia    Hypertension    Nephropathy, diabetic (HCC)    Onychomycosis    Renal insufficiency    Sleep apnea    Varicose veins    Wears dentures    partial upper   Wears hearing aid in both ears    Past Surgical History:  Procedure Laterality Date   CATARACT EXTRACTION W/PHACO Left 06/27/2020   Procedure: CATARACT EXTRACTION PHACO AND INTRAOCULAR LENS PLACEMENT (IOC) LEFT DIABETIC 13.57 01:47.8 12.6%;  Surgeon: Mittie Gaskin, MD;  Location: [facility];  Service: Ophthalmology;  Laterality: Left;  Diabetic - oral meds   CATARACT EXTRACTION W/PHACO Right 07/11/2020   Procedure: CATARACT EXTRACTION PHACO AND INTRAOCULAR LENS PLACEMENT (IOC) RIGHT DIABETIC;  Surgeon: Mittie Gaskin, MD;  Location: [facility];   Service: Ophthalmology;  Laterality: Right;  9.28 1:15.4 12.3%   COLONOSCOPY WITH PROPOFOL  N/A 09/28/2015   Procedure: COLONOSCOPY WITH PROPOFOL ;  Surgeon: Lamar ONEIDA Holmes, MD;  Location: [facility];  Service: Endoscopy;  Laterality: N/A;   CORONARY ANGIOPLASTY     Stent placement x 3 (1999, [id number])   TONSILLECTOMY     Patient Active Problem List   Diagnosis Date Noted   Bilateral carpal tunnel syndrome 06/16/2023   Class 2 severe obesity due to excess calories with serious comorbidity and body mass index (BMI) of 37.0 to 37.9 in adult 03/12/2023   Pain due to onychomycosis of toenails of both feet 05/05/2022   Coronary atherosclerosis 02/13/2020   Hypercholesterolemia 02/13/2020   Localized edema 02/13/2020   Proteinuria 02/13/2020   Abdominal aortic aneurysm 02/13/2020   Angina pectoris 02/13/2020   Atrial fibrillation (HCC) 02/13/2020   Cardiomyopathy (HCC) 02/13/2020   Edema of lower extremity 08/08/2019   Essential hypertension 08/08/2019   Macular degeneration 10/20/2018   Bleeding hemorrhoids 02/09/2017   CKD (chronic kidney disease) stage 3, GFR 30-59 ml/min (HCC) 01/04/2016   Type 2 diabetes mellitus (HCC) 09/18/2014    ONSET DATE: 3-4 years progressive   REFERRING DIAG: R29.898 (ICD-10-CM) -  Leg weakness, bilateral   THERAPY DIAG:  Difficulty in walking, not elsewhere classified  Abnormality of gait and mobility  Muscle weakness (generalized)  Other abnormalities of gait and mobility  Unsteadiness on feet  Rationale for Evaluation and Treatment: Rehabilitation  SUBJECTIVE:                                                                                                                                                                                             SUBJECTIVE STATEMENT:   Pt reports he did a lot of walking today because he had to go to [employer]. Pt reports he feels his walking has greatly improved since beginning therapy. He reports this  weekend his knee felt better after hearing pops down his R LE. He reported if only I could have a le press I wouldn't have to come here. Pt reports stopping taking his blood thinner medication around 4 days ago.  Pt accompanied by: self  PERTINENT HISTORY: PMX: OA, Cancer, DM, HTN  PAIN:  Are you having pain? No slight pain, 0-1/10  PRECAUTIONS: None  RED FLAGS: None   WEIGHT BEARING RESTRICTIONS: No  FALLS: Has patient fallen in last 6 months? Yes. Number of falls 1  LIVING ENVIRONMENT: Lives with: lives with their spouse Lives in: House/apartment Stairs: Yes: External: 3 steps; can reach both Has following equipment at home: trecking pole   PLOF: Independent with household mobility with device and Independent with community mobility with device  PATIENT GOALS: Improve balance and strength   OBJECTIVE:   Note: Objective measures were completed at Evaluation unless otherwise noted.  DIAGNOSTIC FINDINGS: n/a  COGNITION: Overall cognitive status: Within functional limits for tasks assessed   SENSATION: Not tested    EDEMA:  Grade 1-2 Pitting edema noted on B Les, worse on R > L      POSTURE: rounded shoulders and forward head  LOWER EXTREMITY ROM:     Active  Right Eval Left Eval RIGHT  LE  02/22/2024  Hip flexion     Hip extension     Hip abduction     Hip adduction     Hip internal rotation     Hip external rotation     Knee flexion 95 degrees  100 degrees AROM & 104 degrees AROM when trying to reach terminal ROM  Knee extension     Ankle dorsiflexion     Ankle plantarflexion     Ankle inversion     Ankle eversion      (Blank rows = not tested)  LOWER EXTREMITY MMT:    MMT Right Eval Left Eval  Hip  flexion 4 4  Hip extension    Hip abduction 4+ 4+  Hip adduction 4+ 4+  Hip internal rotation    Hip external rotation    Knee flexion 4 4  Knee extension 4 4  Ankle dorsiflexion 4+ 4+  Ankle plantarflexion    Ankle inversion     Ankle eversion    (Blank rows = not tested)  BED MOBILITY:  No reported difficulty  TRANSFERS: Sit to stand: Modified independence  Assistive device utilized: UE support      Stand to sit: Modified independence  Assistive device utilized: heavy UE      Chair to chair: Modified independence  Assistive device utilized: heavy UE        STAIRS: Not tested GAIT: Findings: Gait Characteristics: poor foot clearance- Right and poor foot clearance- Left, Distance walked: 30 ft, and Comments:    FUNCTIONAL TESTS:  5 times sit to stand: 32.05 sec heavy UE Timed up and go (TUG): 17.26 sec no AD : 11.36 sec ( .88 m/s)  BERG: test visit 2   PATIENT SURVEYS:  ABC scale: The Activities-Specific Balance Confidence (ABC) Scale 0% 10 20 30  40 50 60 70 80 90 100% No confidence<->completely confident  "How confident are you that you will not lose your balance or become unsteady when you . . .   Date tested 01/26/24  Walk around the house 100%  2. Walk up or down stairs 70%  3. Bend over and pick up a slipper from in front of a closet floor 80%  4. Reach for a small can off a shelf at eye level 100%  5. Stand on tip toes and reach for something above your head 80%  6. Stand on a chair and reach for something 40%  7. Sweep the floor 90%  8. Walk outside the house to a car parked in the driveway 100%  9. Get into or out of a car 90%  10. Walk across a parking lot to the mall 100%  11. Walk up or down a ramp 100%  12. Walk in a crowded mall where people rapidly walk past you 90%  13. Are bumped into by people as you walk through the mall 80%  14. Step onto or off of an escalator while you are holding onto the railing 90%  15. Step onto or off an escalator while holding onto parcels such that you cannot hold onto the railing 70%  16. Walk outside on icy sidewalks 80%  Total: #/16 85%                                                                                                                                  TREATMENT DATE: 04/18/24    Progress Note 5STS 18.02s with BUE support on chair rails Pt performed 5 time sit<>stand (5xSTS): 18.02 sec (>15 sec indicates increased fall risk)   TUG (2 trials):  trial 1:  12.21s  trial 2: 11.68s Avg: 11.95s PT instructed pt in TUG: 11.95 sec (average of 3 trials; >13.5 sec indicates increased fall risk)  normal pace (2 trials) trial 1: 11.11s (0.90m/s) trial 2: 9.9s (1.69m/s) Avg: 0.61m/s 10 Meter Walk Test: Patient instructed to walk 10 meters (32.8 ft) as quickly and as safely as possible at their normal speed x2. Time measured from 2 meter mark to 8 meter mark to accommodate ramp-up and ramp-down.  Normal speed 1:  0.9 m/s Normal speed 2: 1.01 m/s Average Normal speed: 0.57 m/s Cut off scores: <0.4 m/s = household Ambulator, 0.4-0.8 m/s = limited community Ambulator, >0.8 m/s = community Ambulator, >1.2 m/s = crossing a street, <1.0 = increased fall risk MCID 0.05 m/s (small), 0.13 m/s (moderate), 0.06 m/s (significant)  (ANPTA Core Set of Outcome Measures for Adults with Neurologic Conditions, 2018)  BERG: 46/56   [facility] PT Assessment - 04/18/24 0001       Berg Balance Test   Sit to Stand Able to stand  independently using hands    Standing Unsupported Able to stand safely 2 minutes    Sitting with Back Unsupported but Feet Supported on Floor or Stool Able to sit safely and securely 2 minutes    Stand to Sit Controls descent by using hands    Transfers Able to transfer safely, definite need of hands    Standing Unsupported with Eyes Closed Able to stand 10 seconds safely    Standing Unsupported with Feet Together Able to place feet together independently and stand 1 minute safely    From Standing, Reach Forward with Outstretched Arm Can reach forward >12 cm safely (5)    From Standing Position, Pick up Object from Floor Able to pick up shoe safely and easily    From Standing Position, Turn to Look Behind Over each  Shoulder Turn sideways only but maintains balance    Turn 360 Degrees Able to turn 360 degrees safely in 4 seconds or less    Standing Unsupported, Alternately Place Feet on Step/Stool Able to stand independently and safely and complete 8 steps in 20 seconds    Standing Unsupported, One Foot in Front Able to plae foot ahead of the other independently and hold 30 seconds    Standing on One Leg Tries to lift leg/unable to hold 3 seconds but remains standing independently    Total Score 46         Patient demonstratesmoderate fall risk as noted by score of  46 /56 on Berg Balance Scale.  (<36= high risk for falls, close to 100%; 37-45 significant >80%; 46-51 moderate >50%; 52-55 lower >25%)  Measurement taken of R knee AROM: 98* degrees ABC scale: 84.65% Activities-specific Balance Confidence Scale:  Score: 84.65 Increased risk of falls in community-dwelling, older adults <80% (79.89%)  0% = no confidence - 100% = complete confidence (ANPTA Core Set of Outcome Measures for Adults with Neurologic Conditions, 2018)    PATIENT EDUCATION: Education details: POC. Pt educated throughout session about proper posture and technique with exercises. Improved exercise technique, movement at target joints, use of target muscles after min to mod verbal, visual, tactile cues.  Discussed POC with pt on benefit of continuation of therapy; educated on upcoming progress note at visit 20 to assess progress with therapy, with opportunity to further discuss POC based on findings.   Person educated: Patient Education method: Explanation Education comprehension: verbalized understanding   HOME EXERCISE PROGRAM: Access Code: [email or web address] URL: [email or web address] Date:  03/23/2024 Prepared by: Lonni Gainer  Exercises - Seated Knee Flexion Stretch  - 1-2 x daily - 3 sets - 30 sec hold - Seated Hamstring Stretch  - 1-2 x daily - 3 sets - 30 hold - Standing Knee Flexion Stretch on Step  - 1-2 x  daily - 3 sets - 30 sec hold - Mini Squat with Counter Support  - 1 x daily - 7 x weekly - 3 sets - 10 reps - Sidestepping near counter   - 1 x daily - 7 x weekly - 2 sets - 10 reps - Marching Near Counter  - 1 x daily - 7 x weekly - 3 sets - 10 reps - Mini Lunge with Counter Support   - 1 x daily - 7 x weekly - 1 sets - 10 reps   GOALS: Goals reviewed with patient? Yes  SHORT TERM GOALS: Target date: 02/23/2024     Patient will be independent in home exercise program to improve strength/mobility for better functional independence with ADLs. Baseline: No HEP currently; 02/29/2024= Patient reports compliant with HEP and now has a bar set up to hold onto with exercises. Will keep goal active to progress HEP Goal status: Progressing   LONG TERM GOALS: Target date: 04/19/2024    1.  Patient will complete five times sit to stand test in < 15 seconds indicating an increased LE strength and improved balance. Baseline: 32.05 sec heavy UE 02/29/2024= 16.52 sec with heavy UE support 04/18/2024: 17.02s, BUE support Goal status: IN PROGRESS  2.  Patient will improve ABC score to 90% to demonstrate statistically significant improvement in mobility and quality of life as it relates to their balance.  Baseline: 85% 02/29/2024=86.9% 04/18/2024: 84.37% Goal status: IN PROGRESS   3.  Patient will increase Berg Balance score by > 6 points to demonstrate decreased fall risk during functional activities. Baseline: test visit 2 01/27/2024: 43/56 02/29/2024: 45/56 04/18/2024: 46/56 Goal status: IN PROGRESS   4.   Patient will reduce timed up and go to <11 seconds to reduce fall risk and demonstrate improved transfer/gait ability. Baseline: 17.26 sec;  02/29/2024=15.29 sec avg without AD 04/18/2024: 11.95s avg without AD Goal status: IN PROGRESS  5.   Patient will increase 10 meter walk test to >1.67m/s as to improve gait speed for better community ambulation and to reduce fall risk. Baseline:  0.86 m/s 02/29/2024= 0.97 m/s 04/18/2024: 0.11m/s Goal status: IN PROGRESS  6.  Patient will improve right knee range of motion to 110 degrees or greater in active flexion in order to allow him to improve right lower extremity ability to assist with various transitions including squats and sit to stands. Baseline: 95* AROM 02/22/2024= 100* AROM, 104* terminal ROM.  04/18/24: 98* AROM flexion Goal status: IN PROGRESS   ASSESSMENT:  CLINICAL IMPRESSION:    Progress note completed at today's session. Patient is classified as a moderate fall risk as noted by score of 46/56 on Berg Balance Scale, showing a one point improvement from last test. Pt TUG score average from 2 trials shows improvement since last progress note, completing the test in 11.95s, close to reaching the pt long term goal and classifying him as a lower fall risk. 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 Pt continued heavy use of BUE support for push of and descent in 5STS, which he completed in 17.02s, 2 seconds away from the cutoff of <15 seconds. Looking at pt reported progress and presentation in today's session, pt may be a candidate for DC and to independently continue to progress LE function at home/fitness center. Pt will continue to benefit from skilled physical therapy intervention to address impairments, improve QOL, and attain therapy goals.    OBJECTIVE IMPAIRMENTS: Abnormal gait, decreased activity tolerance, decreased balance, decreased endurance, decreased mobility, difficulty walking, and decreased strength.   ACTIVITY LIMITATIONS: standing, squatting, stairs, and transfers  PARTICIPATION  LIMITATIONS: shopping and community activity  PERSONAL FACTORS: Behavior pattern and 3+ comorbidities:  PMX: OA, Cancer, DM, HTN are also affecting patient's functional outcome.   REHAB POTENTIAL: Good  CLINICAL DECISION MAKING: Evolving/moderate complexity  EVALUATION COMPLEXITY: Moderate  PLAN:  PT FREQUENCY: 2x/week  PT DURATION: 12 weeks  PLANNED INTERVENTIONS: [id number]- Physical Performance Testing, 97110-Therapeutic exercises, 97530- Therapeutic activity, 97112- Neuromuscular re-education, 97535- Self Care, [id number]- Manual therapy, [phone]- Gait training, Balance training, Stair training, Joint mobilization, Joint manipulation, Cryotherapy, and Moist heat  PLAN FOR NEXT SESSION:   Initiate conversation about possible D/C, and access to fitness center to continue improvement independently Continue with knee ROM, pain on R General LE strength and balance training - both static and dynamic within limits of knee pain Tandem stance Dynamic gait with head turns/scanning, turning of environment STS without UE support - gradually decreasing seat height from 21'' this date Stair navigation: progress to step through pattern as appropriate   Renna Helling, SPT 04/18/2024, 2:31 PM

## 2024-04-20 ENCOUNTER — Ambulatory Visit: Admitting: Physical Therapy

## 2024-04-20 ENCOUNTER — Encounter: Payer: Self-pay | Admitting: Physical Therapy

## 2024-04-20 DIAGNOSIS — R2689 Other abnormalities of gait and mobility: Secondary | ICD-10-CM

## 2024-04-20 DIAGNOSIS — M6281 Muscle weakness (generalized): Secondary | ICD-10-CM

## 2024-04-20 DIAGNOSIS — R269 Unspecified abnormalities of gait and mobility: Secondary | ICD-10-CM

## 2024-04-20 DIAGNOSIS — R2681 Unsteadiness on feet: Secondary | ICD-10-CM

## 2024-04-20 DIAGNOSIS — R262 Difficulty in walking, not elsewhere classified: Secondary | ICD-10-CM

## 2024-04-20 NOTE — Therapy (Signed)
 OUTPATIENT PHYSICAL THERAPY DISCHARGE SUMMARY   Patient Name: Glen Jenkins MRN: [id number] DOB:Nov 06, 1940, 83 y.o., male Today's Date: 04/20/2024   PCP:   Auston Reyes BIRCH, MD   REFERRING PROVIDER:   Auston Reyes BIRCH, MD    END OF SESSION:   PT End of Session - 04/20/24 1326     Visit Number 21    Number of Visits 24    Date for Recertification  04/19/24    PT Start Time 1326    PT Stop Time 1357    PT Time Calculation (min) 31 min    Equipment Utilized During Treatment Gait belt    Activity Tolerance Patient tolerated treatment well    Behavior During Therapy WFL for tasks assessed/performed                     Past Medical History:  Diagnosis Date   AAA (abdominal aortic aneurysm)    Anginal pain    Arthritis    lower back   Cancer (HCC)    Lung Cancer; Squamous Cell Carcinoma   Cardiomyopathy (HCC)    Coronary artery disease    CTS (carpal tunnel syndrome)    right - numbness thumb and 1st and 2nd fingers   Diabetes mellitus without complication (HCC)    Dysrhythmia    Atrial Fibrillation   GERD (gastroesophageal reflux disease)    Hypercholesterolemia    Hypertension    Nephropathy, diabetic (HCC)    Onychomycosis    Renal insufficiency    Sleep apnea    Varicose veins    Wears dentures    partial upper   Wears hearing aid in both ears    Past Surgical History:  Procedure Laterality Date   CATARACT EXTRACTION W/PHACO Left 06/27/2020   Procedure: CATARACT EXTRACTION PHACO AND INTRAOCULAR LENS PLACEMENT (IOC) LEFT DIABETIC 13.57 01:47.8 12.6%;  Surgeon: Mittie Gaskin, MD;  Location: [facility];  Service: Ophthalmology;  Laterality: Left;  Diabetic - oral meds   CATARACT EXTRACTION W/PHACO Right 07/11/2020   Procedure: CATARACT EXTRACTION PHACO AND INTRAOCULAR LENS PLACEMENT (IOC) RIGHT DIABETIC;  Surgeon: Mittie Gaskin, MD;  Location: [facility];  Service: Ophthalmology;  Laterality: Right;   9.28 1:15.4 12.3%   COLONOSCOPY WITH PROPOFOL  N/A 09/28/2015   Procedure: COLONOSCOPY WITH PROPOFOL ;  Surgeon: Lamar ONEIDA Holmes, MD;  Location: [facility];  Service: Endoscopy;  Laterality: N/A;   CORONARY ANGIOPLASTY     Stent placement x 3 (1999, 7994,7987)   TONSILLECTOMY     Patient Active Problem List   Diagnosis Date Noted   Bilateral carpal tunnel syndrome 06/16/2023   Class 2 severe obesity due to excess calories with serious comorbidity and body mass index (BMI) of 37.0 to 37.9 in adult 03/12/2023   Pain due to onychomycosis of toenails of both feet 05/05/2022   Coronary atherosclerosis 02/13/2020   Hypercholesterolemia 02/13/2020   Localized edema 02/13/2020   Proteinuria 02/13/2020   Abdominal aortic aneurysm 02/13/2020   Angina pectoris 02/13/2020   Atrial fibrillation (HCC) 02/13/2020   Cardiomyopathy (HCC) 02/13/2020   Edema of lower extremity 08/08/2019   Essential hypertension 08/08/2019   Macular degeneration 10/20/2018   Bleeding hemorrhoids 02/09/2017   CKD (chronic kidney disease) stage 3, GFR 30-59 ml/min (HCC) 01/04/2016   Type 2 diabetes mellitus (HCC) 09/18/2014    ONSET DATE: 3-4 years progressive   REFERRING DIAG: R29.898 (ICD-10-CM) - Leg weakness, bilateral   THERAPY DIAG:  Difficulty in walking, not elsewhere classified  Abnormality  of gait and mobility  Muscle weakness (generalized)  Other abnormalities of gait and mobility  Unsteadiness on feet  Rationale for Evaluation and Treatment: Rehabilitation  SUBJECTIVE:                                                                                                                                                                                             SUBJECTIVE STATEMENT:   Pt reports he is doing okay today, besides pain in R knee, more so from walking from [facility] to rehab gym just before. Pt reports his doctor is aware of him ceasing his blood thinner medication occasionally  due to excess bruising and bleeding. Pt reports being very pleased with the progress he has made in his confidence in his walking ability. He reports walking heel to toe has helped a lot.   Pt accompanied by: self  PERTINENT HISTORY: PMX: OA, Cancer, DM, HTN  PAIN:  Are you having pain? No Pain in R knee right now 1/10.  PRECAUTIONS: None  RED FLAGS: None   WEIGHT BEARING RESTRICTIONS: No  FALLS: Has patient fallen in last 6 months? Yes. Number of falls 1  LIVING ENVIRONMENT: Lives with: lives with their spouse Lives in: House/apartment Stairs: Yes: External: 3 steps; can reach both Has following equipment at home: trecking pole   PLOF: Independent with household mobility with device and Independent with community mobility with device  PATIENT GOALS: Improve balance and strength   OBJECTIVE:   Note: Objective measures were completed at Evaluation unless otherwise noted.  DIAGNOSTIC FINDINGS: n/a  COGNITION: Overall cognitive status: Within functional limits for tasks assessed   SENSATION: Not tested    EDEMA:  Grade 1-2 Pitting edema noted on B Les, worse on R > L      POSTURE: rounded shoulders and forward head  LOWER EXTREMITY ROM:     Active  Right Eval Left Eval RIGHT  LE  02/22/2024  Hip flexion     Hip extension     Hip abduction     Hip adduction     Hip internal rotation     Hip external rotation     Knee flexion 95 degrees  100 degrees AROM & 104 degrees AROM when trying to reach terminal ROM  Knee extension     Ankle dorsiflexion     Ankle plantarflexion     Ankle inversion     Ankle eversion      (Blank rows = not tested)  LOWER EXTREMITY MMT:    MMT Right Eval Left Eval  Hip flexion 4 4  Hip extension    Hip  abduction 4+ 4+  Hip adduction 4+ 4+  Hip internal rotation    Hip external rotation    Knee flexion 4 4  Knee extension 4 4  Ankle dorsiflexion 4+ 4+  Ankle plantarflexion    Ankle inversion    Ankle eversion     (Blank rows = not tested)  BED MOBILITY:  No reported difficulty  TRANSFERS: Sit to stand: Modified independence  Assistive device utilized: UE support      Stand to sit: Modified independence  Assistive device utilized: heavy UE      Chair to chair: Modified independence  Assistive device utilized: heavy UE        STAIRS: Not tested GAIT: Findings: Gait Characteristics: poor foot clearance- Right and poor foot clearance- Left, Distance walked: 30 ft, and Comments:    FUNCTIONAL TESTS:  5 times sit to stand: 32.05 sec heavy UE Timed up and go (TUG): 17.26 sec no AD : 11.36 sec ( .88 m/s)  BERG: test visit 2   PATIENT SURVEYS:  ABC scale: The Activities-Specific Balance Confidence (ABC) Scale 0% 10 20 30  40 50 60 70 80 90 100% No confidence<->completely confident  "How confident are you that you will not lose your balance or become unsteady when you . . .   Date tested 01/26/24  Walk around the house 100%  2. Walk up or down stairs 70%  3. Bend over and pick up a slipper from in front of a closet floor 80%  4. Reach for a small can off a shelf at eye level 100%  5. Stand on tip toes and reach for something above your head 80%  6. Stand on a chair and reach for something 40%  7. Sweep the floor 90%  8. Walk outside the house to a car parked in the driveway 100%  9. Get into or out of a car 90%  10. Walk across a parking lot to the mall 100%  11. Walk up or down a ramp 100%  12. Walk in a crowded mall where people rapidly walk past you 90%  13. Are bumped into by people as you walk through the mall 80%  14. Step onto or off of an escalator while you are holding onto the railing 90%  15. Step onto or off an escalator while holding onto parcels such that you cannot hold onto the railing 70%  16. Walk outside on icy sidewalks 80%  Total: #/16 85%                                                                                                                                  TREATMENT DATE: 04/20/24    -education regarding benefits of [facility] and continuing LE strength training for decrease in L knee pain, improved L knee ROM, and improved stair climbing ability  -education on progression of goals and physical outcome measure scores that show improvement  -education  on proper and safe use of Nustep, leg press, knee flex/ext machine, and shoulder press machine in [facility]    PATIENT EDUCATION: Education details: POC. Pt educated today on acces to [facility] to independently progress working on knee strength. Pt educated on DC process and on technique and function of Nustep, leg press, knee flex/ext machine, and shoulder press. Pt educated throughout session about proper posture and technique with exercises. Improved exercise technique, movement at target joints, use of target muscles after min to mod verbal, visual, tactile cues.  Person educated: Patient Education method: Explanation Education comprehension: verbalized understanding   HOME EXERCISE PROGRAM: Access Code: [email or web address] URL: [email or web address] Date: 03/23/2024 Prepared by: Lonni Gainer  Exercises - Seated Knee Flexion Stretch  - 1-2 x daily - 3 sets - 30 sec hold - Seated Hamstring Stretch  - 1-2 x daily - 3 sets - 30 hold - Standing Knee Flexion Stretch on Step  - 1-2 x daily - 3 sets - 30 sec hold - Mini Squat with Counter Support  - 1 x daily - 7 x weekly - 3 sets - 10 reps - Sidestepping near counter   - 1 x daily - 7 x weekly - 2 sets - 10 reps - Marching Near Counter  - 1 x daily - 7 x weekly - 3 sets - 10 reps - Mini Lunge with Counter Support   - 1 x daily - 7 x weekly - 1 sets - 10 reps   GOALS: Goals reviewed with patient? Yes  SHORT TERM GOALS: Target date: 02/23/2024    Patient will be independent in home exercise program to improve strength/mobility for better functional independence with ADLs. Baseline: No HEP currently; 02/29/2024= Patient reports  compliant with HEP and now has a bar set up to hold onto with exercises. Will keep goal active to progress HEP Goal status: Progressing   LONG TERM GOALS: Target date: 04/19/2024    1.  Patient will complete five times sit to stand test in < 15 seconds indicating an increased LE strength and improved balance. Baseline: 32.05 sec heavy UE 02/29/2024= 16.52 sec with heavy UE support 04/18/2024: 17.02s, BUE support Goal status: IN PROGRESS  2.  Patient will improve ABC score to 90% to demonstrate statistically significant improvement in mobility and quality of life as it relates to their balance.  Baseline: 85% 02/29/2024=86.9% 04/18/2024: 84.37% Goal status: IN PROGRESS   3.  Patient will increase Berg Balance score by > 6 points to demonstrate decreased fall risk during functional activities. Baseline: test visit 2 01/27/2024: 43/56 02/29/2024: 45/56 04/18/2024: 46/56 Goal status: IN PROGRESS   4.   Patient will reduce timed up and go to <11 seconds to reduce fall risk and demonstrate improved transfer/gait ability. Baseline: 17.26 sec;  02/29/2024=15.29 sec avg without AD 04/18/2024: 11.95s avg without AD Goal status: IN PROGRESS  5.   Patient will increase 10 meter walk test to >1.103m/s as to improve gait speed for better community ambulation and to reduce fall risk. Baseline: 0.86 m/s 02/29/2024= 0.97 m/s 04/18/2024: 0.73m/s Goal status: IN PROGRESS  6.  Patient will improve right knee range of motion to 110 degrees or greater in active flexion in order to allow him to improve right lower extremity ability to assist with various transitions including squats and sit to stands. Baseline: 95* AROM 02/22/2024= 100* AROM, 104* terminal ROM.  04/18/24: 98* AROM flexion Goal status: IN PROGRESS   ASSESSMENT:  CLINICAL IMPRESSION:   Pt arrived to  today's session a couple of minutes late due to walking from [facility] just before. Discussed with pt today regarding discharge  from PT due to pt reported decrease in neck pain, decrease in balance issues, and increased confidence in regard to function and ability to maintain postural control in community/public environments. Pt was receptive of plan to discharge and begin independently working on LE strengthening at the [facility], which he is currently already a member of. He reported following discussion of the plan, This is exactly what I was thinking. Pt was showed and instructed in use of Nustep, leg press, knee extension, and shoulder press machines in [facility]. Pt demonstrated and verbalized understanding of machine adjustments and machine function and purpose. Pt knee ext showed 5/5 strength, and knee flex showing 4+/5 strength MMT scores at end of appt.  Pt was educated on attending [facility] 2-3x/week, and also on returning to therapy if he noticed future decline in function or balance.  OBJECTIVE IMPAIRMENTS: Abnormal gait, decreased activity tolerance, decreased balance, decreased endurance, decreased mobility, difficulty walking, and decreased strength.   ACTIVITY LIMITATIONS: standing, squatting, stairs, and transfers  PARTICIPATION LIMITATIONS: shopping and community activity  PERSONAL FACTORS: Behavior pattern and 3+ comorbidities:  PMX: OA, Cancer, DM, HTN are also affecting patient's functional outcome.   REHAB POTENTIAL: Good  CLINICAL DECISION MAKING: Evolving/moderate complexity  EVALUATION COMPLEXITY: Moderate  PLAN:  PT FREQUENCY: 2x/week  PT DURATION: 12 weeks  PLANNED INTERVENTIONS: 97750- Physical Performance Testing, 97110-Therapeutic exercises, 97530- Therapeutic activity, 97112- Neuromuscular re-education, 97535- Self Care, [id number]- Manual therapy, [phone]- Gait training, Balance training, Stair training, Joint mobilization, Joint manipulation, Cryotherapy, and Moist heat  PLAN FOR NEXT SESSION:   Pt discharged today to [facility] for continued independent LE strength and  function training.  Renna Helling, SPT 04/20/2024, 2:13 PM

## 2024-04-25 ENCOUNTER — Ambulatory Visit: Admitting: Physical Therapy

## 2024-04-27 ENCOUNTER — Ambulatory Visit: Admitting: Physical Therapy

## 2024-05-02 ENCOUNTER — Ambulatory Visit: Admitting: Physical Therapy

## 2024-05-04 ENCOUNTER — Ambulatory Visit: Admitting: Physical Therapy

## 2024-05-09 ENCOUNTER — Encounter: Payer: Self-pay | Admitting: Podiatry

## 2024-05-09 ENCOUNTER — Ambulatory Visit

## 2024-05-09 ENCOUNTER — Ambulatory Visit: Admitting: Podiatry

## 2024-05-09 DIAGNOSIS — B351 Tinea unguium: Secondary | ICD-10-CM

## 2024-05-09 DIAGNOSIS — M79675 Pain in left toe(s): Secondary | ICD-10-CM | POA: Diagnosis not present

## 2024-05-09 DIAGNOSIS — E119 Type 2 diabetes mellitus without complications: Secondary | ICD-10-CM

## 2024-05-09 DIAGNOSIS — M79674 Pain in right toe(s): Secondary | ICD-10-CM | POA: Diagnosis not present

## 2024-05-09 DIAGNOSIS — Z794 Long term (current) use of insulin: Secondary | ICD-10-CM

## 2024-05-09 NOTE — Progress Notes (Signed)
  Subjective:  Patient ID: Glen Jenkins, male    DOB: 01-07-1941,  MRN: [id number]  Glen Jenkins presents to clinic today for preventative diabetic foot care for painful thick toenails that are difficult to trim. Pain interferes with ambulation. Aggravating factors include wearing enclosed shoe gear. Pain is relieved with periodic professional debridement.   New problem(s): None.   PCP is Auston Reyes BIRCH, MD. ARNETTA 04/21/24.  Allergies  Allergen Reactions   Farxiga [Dapagliflozin] Other (See Comments)    My BP dropped tremendously.   Sulfa Antibiotics     Review of Systems: Negative except as noted in the HPI.  Objective:  There were no vitals filed for this visit. Glen Jenkins is a pleasant 83 y.o. male morbidly obese in NAD. AAO x 3.  Vascular Examination: Vascular status intact b/l with palpable pedal pulses. Spider veins noted dorsal forefoot area b/l. Pedal hair sparse. CFT immediate b/l. Trace edema. No pain with calf compression b/l. Skin temperature gradient WNL b/l.   Neurological Examination: Sensation grossly intact b/l with 10 gram monofilament. Vibratory sensation intact b/l.   Dermatological Examination: Pedal skin with normal turgor, texture and tone b/l. Toenails 1-5 b/l thick, discolored, elongated with subungual debris and pain on dorsal palpation.   No hyperkeratotic nor porokeratotic lesions present on today's visit.  Musculoskeletal Examination: Muscle strength 5/5 to b/l LE. Pes planus deformity noted bilateral LE.  Radiographs: None  Assessment/Plan: 1. Pain due to onychomycosis of toenails of both feet   2. Type 2 diabetes mellitus without complication, with long-term current use of insulin (HCC)   Patient was evaluated and treated. All patient's and/or POA's questions/concerns addressed on today's visit. Toenails 1-5 b/l debrided in length and girth without incident. Continue foot and shoe inspections daily. Monitor blood glucose per  PCP/Endocrinologist's recommendations. Continue soft, supportive shoe gear daily. Report any pedal injuries to medical professional. Call office if there are any questions/concerns. -Patient/POA to call should there be question/concern in the interim.   Return in about 3 months (around 08/07/2024).  Glen Jenkins, DPM      [facility] LOCATION: [address] [place] [address]                   Office [phone]   [facility] LOCATION: [address] [place], [place] [address] Office [phone]

## 2024-05-11 ENCOUNTER — Ambulatory Visit: Admitting: Physical Therapy

## 2024-08-08 ENCOUNTER — Ambulatory Visit: Admitting: Podiatry
